# Patient Record
Sex: Male | Born: 1945
Health system: Southern US, Community
[De-identification: ages and names within clinical notes are randomized; demographics above are authoritative.]

## PROBLEM LIST (undated history)

## (undated) DIAGNOSIS — C61 Malignant neoplasm of prostate: Secondary | ICD-10-CM

## (undated) DIAGNOSIS — J449 Chronic obstructive pulmonary disease, unspecified: Secondary | ICD-10-CM

## (undated) DIAGNOSIS — R0600 Dyspnea, unspecified: Secondary | ICD-10-CM

## (undated) DIAGNOSIS — H269 Unspecified cataract: Secondary | ICD-10-CM

## (undated) DIAGNOSIS — M1990 Unspecified osteoarthritis, unspecified site: Secondary | ICD-10-CM

## (undated) DIAGNOSIS — H9319 Tinnitus, unspecified ear: Secondary | ICD-10-CM

## (undated) DIAGNOSIS — I10 Essential (primary) hypertension: Secondary | ICD-10-CM

## (undated) DIAGNOSIS — I82469 Acute embolism and thrombosis of unspecified calf muscular vein: Secondary | ICD-10-CM

## (undated) HISTORY — PX: PROSTATE BIOPSY: SHX241

## (undated) HISTORY — DX: Unspecified osteoarthritis, unspecified site: M19.90

## (undated) HISTORY — DX: Malignant neoplasm of prostate: C61

## (undated) HISTORY — PX: VENTRAL HERNIA REPAIR: SHX424

---

## 1998-10-07 ENCOUNTER — Ambulatory Visit (HOSPITAL_BASED_OUTPATIENT_CLINIC_OR_DEPARTMENT_OTHER): Admission: RE | Admit: 1998-10-07 | Discharge: 1998-10-07 | Payer: Self-pay | Admitting: *Deleted

## 1999-06-18 ENCOUNTER — Ambulatory Visit (HOSPITAL_COMMUNITY): Admission: RE | Admit: 1999-06-18 | Discharge: 1999-06-18 | Payer: Self-pay | Admitting: Gastroenterology

## 2003-04-22 ENCOUNTER — Ambulatory Visit (HOSPITAL_COMMUNITY): Admission: RE | Admit: 2003-04-22 | Discharge: 2003-04-22 | Payer: Self-pay | Admitting: Gastroenterology

## 2003-07-04 ENCOUNTER — Encounter: Admission: RE | Admit: 2003-07-04 | Discharge: 2003-07-04 | Payer: Self-pay | Admitting: Internal Medicine

## 2004-02-05 ENCOUNTER — Encounter: Admission: RE | Admit: 2004-02-05 | Discharge: 2004-02-05 | Payer: Self-pay | Admitting: Gastroenterology

## 2004-02-13 ENCOUNTER — Ambulatory Visit (HOSPITAL_COMMUNITY): Admission: RE | Admit: 2004-02-13 | Discharge: 2004-02-13 | Payer: Self-pay | Admitting: Gastroenterology

## 2004-08-13 ENCOUNTER — Encounter: Admission: RE | Admit: 2004-08-13 | Discharge: 2004-08-13 | Payer: Self-pay | Admitting: Gastroenterology

## 2004-08-31 ENCOUNTER — Encounter: Admission: RE | Admit: 2004-08-31 | Discharge: 2004-08-31 | Payer: Self-pay | Admitting: Gastroenterology

## 2005-02-08 ENCOUNTER — Encounter: Admission: RE | Admit: 2005-02-08 | Discharge: 2005-02-08 | Payer: Self-pay | Admitting: Gastroenterology

## 2006-05-26 ENCOUNTER — Encounter: Admission: RE | Admit: 2006-05-26 | Discharge: 2006-05-26 | Payer: Self-pay | Admitting: Gastroenterology

## 2012-05-22 DIAGNOSIS — M25551 Pain in right hip: Secondary | ICD-10-CM | POA: Insufficient documentation

## 2012-05-22 DIAGNOSIS — S76319A Strain of muscle, fascia and tendon of the posterior muscle group at thigh level, unspecified thigh, initial encounter: Secondary | ICD-10-CM | POA: Insufficient documentation

## 2012-07-03 DIAGNOSIS — M1710 Unilateral primary osteoarthritis, unspecified knee: Secondary | ICD-10-CM | POA: Insufficient documentation

## 2012-07-03 DIAGNOSIS — M179 Osteoarthritis of knee, unspecified: Secondary | ICD-10-CM | POA: Insufficient documentation

## 2012-09-08 ENCOUNTER — Other Ambulatory Visit: Payer: Self-pay | Admitting: Dermatology

## 2012-10-24 ENCOUNTER — Other Ambulatory Visit: Payer: Self-pay | Admitting: Dermatology

## 2013-06-20 ENCOUNTER — Other Ambulatory Visit: Payer: Self-pay | Admitting: Gastroenterology

## 2013-08-13 ENCOUNTER — Other Ambulatory Visit: Payer: Self-pay | Admitting: Gastroenterology

## 2013-08-13 DIAGNOSIS — K824 Cholesterolosis of gallbladder: Secondary | ICD-10-CM

## 2013-08-17 ENCOUNTER — Ambulatory Visit
Admission: RE | Admit: 2013-08-17 | Discharge: 2013-08-17 | Disposition: A | Discharge status: Home / Self Care | Payer: Medicare Other | Source: Ambulatory Visit | Attending: Gastroenterology | Admitting: Gastroenterology

## 2013-08-17 DIAGNOSIS — K824 Cholesterolosis of gallbladder: Secondary | ICD-10-CM

## 2014-01-04 ENCOUNTER — Encounter: Payer: Self-pay | Admitting: Radiation Oncology

## 2014-01-04 NOTE — Progress Notes (Signed)
GU Location of Tumor / Histology: adenocarcinoma of prostate  If Prostate Cancer, Gleason Score is (3 + 4) and PSA is (5.07)  Wayne Roberson presented August 2014 with an elevated PSA of 3.96  Biopsies of prostate (if applicable) revealed:    Past/Anticipated interventions by urology, if any: ultrasound, biopsy, discuss about treatment options, referral to [facility]  Past/Anticipated interventions by medical oncology, if any: no  Weight changes, if any: no  Bowel/Bladder complaints, if any: yes, nocturia x2  Nausea/Vomiting, if any: no  Pain issues, if any:  no  SAFETY ISSUES:  Prior radiation? no  Pacemaker/ICD? no  Possible current pregnancy? no  Is the patient on methotrexate? no  Current Complaints / other details:  68 year old male. Referred by Dr. Janice Norrie. Not interested in prostatectomy and active surveillance is an option. Would like radiation therapy. Prostate volume 27.86 cc.

## 2014-01-06 ENCOUNTER — Encounter: Payer: Self-pay | Admitting: Radiation Oncology

## 2014-01-06 DIAGNOSIS — C61 Malignant neoplasm of prostate: Secondary | ICD-10-CM | POA: Insufficient documentation

## 2014-01-06 NOTE — Progress Notes (Signed)
Radiation Oncology         [phone]) [phone] ________________________________     Initial outpatient Consultation  Name: Wayne Roberson MRN: [id number]  Date: 01/07/2014  DOB: December 28, 1945  CC:No primary provider on file.  [name], MD   REFERRING PHYSICIAN: [name], MD  DIAGNOSIS: 68 y.o. gentleman with stage T1c adenocarcinoma of the prostate with a Gleason's score of 3+4 and a PSA of 5.07  HISTORY OF PRESENT ILLNESS::Wayne Roberson is a 68 y.o. gentleman followed by Dr. Janice Norrie after referral from Dr. Earle Gell noted rising PSA.  It seemed to stabilize.  More recently, he was noted to have a rising PSA.  It was 3.96 on 11/06/12, then 4.21 on 05/09/13, and 5.07 on 10/31/13.  He saw Dr. Janice Norrie in the office on 11/07/13,  digital rectal examination was performed at that time revealing a 2+ gland with no nodule.  The patient proceeded to transrectal ultrasound with 12 biopsies of the prostate on 12/20/13.  The prostate volume measured 27.86 cc.  Out of 12 core biopsies, 7 were positive.  The maximum Gleason score was 3+4, and this was seen in the left base and mid gland as shown below:    The patient reviewed the biopsy results with his urologist and he has kindly been referred today for discussion of potential radiation treatment options.  PREVIOUS RADIATION THERAPY: No  PAST MEDICAL HISTORY:  has a past medical history of Arthritis and Prostate cancer.    PAST SURGICAL HISTORY: Past Surgical History  Procedure Laterality Date  . Ventral hernia repair    . Prostate biopsy      FAMILY HISTORY: family history includes Heart failure in his father and mother. There is no history of Cancer.  SOCIAL HISTORY:  reports that he quit smoking about 9 years ago. His smoking use included Cigarettes. He has a 84 pack-year smoking history. He has never used smokeless tobacco. He reports that he drinks about one ounce of alcohol per week. He reports that he does not use illicit  drugs.  ALLERGIES: Penicillins  MEDICATIONS:  Current Outpatient Prescriptions  Medication Sig Dispense Refill  . fluocinonide (LIDEX) 0.05 % external solution Apply 1 application topically 2 (two) times daily.      [name] LORazepam (ATIVAN) 0.5 MG tablet Take 0.5 mg by mouth every 8 (eight) hours.       No current facility-administered medications for this encounter.    REVIEW OF SYSTEMS:  A 15 point review of systems is documented in the electronic medical record. This was obtained by the nursing staff. However, I reviewed this with the patient to discuss relevant findings and make appropriate changes.  A comprehensive review of systems was negative..  The patient completed an IPSS and IIEF questionnaire.  His IPSS score was 4 indicating mild urinary outflow obstructive symptoms.  He indicated that his erectile function is able to complete sexual activity on almost all attempts.   PHYSICAL EXAM: This patient is in no acute distress.  He is alert and oriented.   height is 5\' 10"  (1.778 m) and weight is 191 lb 11.2 oz (86.955 kg). His blood pressure is 158/82 and his pulse is 67. His respiration is 16.  He exhibits no respiratory distress or labored breathing.  He appears neurologically intact.  His mood is pleasant.  His affect is appropriate.  Please note the digital rectal exam findings described above.  KPS = 100  LABORATORY DATA: see HPI  RADIOGRAPHY: No results  found.    IMPRESSION: This gentleman is a 68 y.o. gentleman with stage T1c adenocarcinoma of the prostate with a Gleason's score of 3+4 and a PSA of 5.07.  His T-Stage, Gleason's Score, and PSA put him into the intermediate risk group.  Accordingly he is eligible for a variety of potential treatment options including robotic-assisted laparoscopic radical prostatectomy, external beam radiation treatment, external beam radiation treatment with seed implant boost, or seed implantation as monotherapy. He is not eligible for active  surveillance.  PLAN:Today I reviewed the findings and workup thus far.  We discussed the natural history of prostate cancer.  We reviewed the the implications of T-stage, Gleason's Score, and PSA on decision-making and outcomes in prostate cancer.  We discussed radiation treatment in the management of prostate cancer with regard to the logistics and delivery of external beam radiation treatment as well as the logistics and delivery of prostate brachytherapy.  We compared and contrasted each of these approaches and also compared these against prostatectomy.  The patient expressed interest in prostate brachytherapy.  I filled out a patient counseling form for him with relevant treatment diagrams and we retained a copy for our records.   The patient would like to proceed with prostate brachytherapy.  I will share my findings with Dr. Janice Norrie and move forward with scheduling the procedure in the near future.     I enjoyed meeting with him today, and will look forward to participating in the care of this very nice gentleman.   I spent 60 minutes face to face with the patient and more than 50% of that time was spent in counseling and/or coordination of care.   ------------------------------------------------  Sheral Apley. Tammi Klippel, M.D.

## 2014-01-07 ENCOUNTER — Ambulatory Visit
Admission: RE | Admit: 2014-01-07 | Discharge: 2014-01-07 | Disposition: A | Discharge status: Home / Self Care | Payer: Medicare Other | Source: Ambulatory Visit | Attending: Radiation Oncology | Admitting: Radiation Oncology

## 2014-01-07 ENCOUNTER — Encounter: Payer: Self-pay | Admitting: Radiation Oncology

## 2014-01-07 VITALS — BP 158/82 | HR 67 | Resp 16 | Ht 70.0 in | Wt 191.7 lb

## 2014-01-07 DIAGNOSIS — Z87891 Personal history of nicotine dependence: Secondary | ICD-10-CM | POA: Insufficient documentation

## 2014-01-07 DIAGNOSIS — C61 Malignant neoplasm of prostate: Secondary | ICD-10-CM | POA: Diagnosis not present

## 2014-01-07 DIAGNOSIS — Z510 Encounter for antineoplastic radiation therapy: Secondary | ICD-10-CM | POA: Insufficient documentation

## 2014-01-07 NOTE — Progress Notes (Signed)
See progress note under physician encounter. 

## 2014-01-08 ENCOUNTER — Telehealth: Payer: Self-pay | Admitting: *Deleted

## 2014-01-08 NOTE — Telephone Encounter (Signed)
CALLED PATIENT TO INFORM OF PRE-SEED APPT. AND IMPLANT, LVM FOR A RETURN CALL

## 2014-01-09 ENCOUNTER — Other Ambulatory Visit: Payer: Self-pay | Admitting: Urology

## 2014-01-17 ENCOUNTER — Telehealth: Payer: Self-pay | Admitting: *Deleted

## 2014-01-17 NOTE — Telephone Encounter (Signed)
Called patient to remind of pre-seed appt. For 01-18-14, lvm for a return call

## 2014-01-18 ENCOUNTER — Ambulatory Visit
Admission: RE | Admit: 2014-01-18 | Discharge: 2014-01-18 | Disposition: A | Discharge status: Home / Self Care | Payer: Medicare Other | Source: Ambulatory Visit | Attending: Radiation Oncology | Admitting: Radiation Oncology

## 2014-01-18 ENCOUNTER — Ambulatory Visit (HOSPITAL_COMMUNITY)
Admission: RE | Admit: 2014-01-18 | Discharge: 2014-01-18 | Disposition: A | Discharge status: Home / Self Care | Payer: Medicare Other | Source: Ambulatory Visit | Attending: Urology | Admitting: Urology

## 2014-01-18 ENCOUNTER — Encounter: Payer: Self-pay | Admitting: Radiation Oncology

## 2014-01-18 ENCOUNTER — Other Ambulatory Visit: Payer: Self-pay

## 2014-01-18 DIAGNOSIS — Z87891 Personal history of nicotine dependence: Secondary | ICD-10-CM | POA: Diagnosis not present

## 2014-01-18 DIAGNOSIS — Z01810 Encounter for preprocedural cardiovascular examination: Secondary | ICD-10-CM | POA: Diagnosis not present

## 2014-01-18 DIAGNOSIS — Z01818 Encounter for other preprocedural examination: Secondary | ICD-10-CM

## 2014-01-18 DIAGNOSIS — C61 Malignant neoplasm of prostate: Secondary | ICD-10-CM

## 2014-01-18 DIAGNOSIS — Z510 Encounter for antineoplastic radiation therapy: Secondary | ICD-10-CM | POA: Diagnosis not present

## 2014-01-18 NOTE — Progress Notes (Signed)
  Radiation Oncology         [phone]) [phone] ________________________________     Name: Wayne Roberson MRN: [id number]  Date: 01/18/2014  DOB: 10-27-1945  SIMULATION AND TREATMENT PLANNING NOTE PUBIC ARCH STUDY  REFERRING PHYSICIAN: [name], MD  DIAGNOSIS: 68 y.o. gentleman with stage T1c adenocarcinoma of the prostate with a Gleason's score of 3+4 and a PSA of 5.07    ICD-9-CM ICD-10-CM  1. Prostate cancer 185 C61    COMPLEX SIMULATION:  The patient presented today for evaluation for possible prostate seed implant. He was brought to the radiation planning suite and placed supine on the CT couch. A 3-dimensional image study set was obtained in upload to the planning computer. There, on each axial slice, I contoured the prostate gland. Then, using three-dimensional radiation planning tools I reconstructed the prostate in view of the structures from the transperineal needle pathway to assess for possible pubic arch interference. In doing so, I did not appreciate any pubic arch interference. Also, the patient's prostate volume was estimated based on the drawn structure. The volume was 33 cc.  Given the pubic arch appearance and prostate volume, patient remains a good candidate to proceed with prostate seed implant. Today, he freely provided informed written consent to proceed.    PLAN: The patient will undergo prostate seed implant.   ________________________________  Sheral Apley. Tammi Klippel, M.D.

## 2014-02-25 ENCOUNTER — Encounter (HOSPITAL_BASED_OUTPATIENT_CLINIC_OR_DEPARTMENT_OTHER): Payer: Self-pay | Admitting: *Deleted

## 2014-02-25 DIAGNOSIS — N400 Enlarged prostate without lower urinary tract symptoms: Secondary | ICD-10-CM | POA: Diagnosis not present

## 2014-02-25 DIAGNOSIS — Z87891 Personal history of nicotine dependence: Secondary | ICD-10-CM | POA: Diagnosis not present

## 2014-02-25 DIAGNOSIS — M13869 Other specified arthritis, unspecified knee: Secondary | ICD-10-CM | POA: Diagnosis not present

## 2014-02-25 DIAGNOSIS — C61 Malignant neoplasm of prostate: Secondary | ICD-10-CM | POA: Diagnosis present

## 2014-02-25 LAB — PROTIME-INR
INR: 0.94 (ref 0.00–1.49)
Prothrombin Time: 12.7 seconds (ref 11.6–15.2)

## 2014-02-25 LAB — CBC
HCT: 43.2 % (ref 39.0–52.0)
Hemoglobin: 14.3 g/dL (ref 13.0–17.0)
MCH: 29.1 pg (ref 26.0–34.0)
MCHC: 33.1 g/dL (ref 30.0–36.0)
MCV: 88 fL (ref 78.0–100.0)
Platelets: 185 10*3/uL (ref 150–400)
RBC: 4.91 MIL/uL (ref 4.22–5.81)
RDW: 13.9 % (ref 11.5–15.5)
WBC: 6.3 10*3/uL (ref 4.0–10.5)

## 2014-02-25 LAB — COMPREHENSIVE METABOLIC PANEL
ALT: 21 U/L (ref 0–53)
AST: 21 U/L (ref 0–37)
Albumin: 3.6 g/dL (ref 3.5–5.2)
Alkaline Phosphatase: 69 U/L (ref 39–117)
Anion gap: 12 (ref 5–15)
BUN: 16 mg/dL (ref 6–23)
CO2: 25 mEq/L (ref 19–32)
Calcium: 9.1 mg/dL (ref 8.4–10.5)
Chloride: 103 mEq/L (ref 96–112)
Creatinine, Ser: 0.77 mg/dL (ref 0.50–1.35)
GFR calc Af Amer: >90 mL/min (ref >90)
GFR calc non Af Amer: 90 mL/min — ABNORMAL LOW (ref >90)
Glucose, Bld: 135 mg/dL — ABNORMAL HIGH (ref 70–99)
Potassium: 4.1 mEq/L (ref 3.7–5.3)
Sodium: 140 mEq/L (ref 137–147)
Total Bilirubin: 0.8 mg/dL (ref 0.3–1.2)
Total Protein: 6.7 g/dL (ref 6.0–8.3)

## 2014-02-25 LAB — APTT: aPTT: 26 seconds (ref 24–37)

## 2014-02-25 NOTE — Progress Notes (Signed)
Pt instructed npo p mn 12/3.to [facility] 12/4 @ 0800.  Pt to do fleets enema prior to arrival.  Labs, ekg, cxr in chart.

## 2014-02-27 DIAGNOSIS — Z510 Encounter for antineoplastic radiation therapy: Secondary | ICD-10-CM | POA: Diagnosis not present

## 2014-02-28 ENCOUNTER — Telehealth: Payer: Self-pay | Admitting: *Deleted

## 2014-02-28 NOTE — Telephone Encounter (Signed)
CALLED PATIENT TO REMIND OF PROCEDURE FOR 03-01-14, SPOKE WITH PATIENT AND HE IS AWARE OF THIS PROCEDURE

## 2014-03-01 ENCOUNTER — Ambulatory Visit (HOSPITAL_BASED_OUTPATIENT_CLINIC_OR_DEPARTMENT_OTHER): Payer: Medicare Other | Admitting: Anesthesiology

## 2014-03-01 ENCOUNTER — Encounter (HOSPITAL_BASED_OUTPATIENT_CLINIC_OR_DEPARTMENT_OTHER)
Admission: RE | Disposition: A | Discharge status: Home / Self Care | Payer: Self-pay | Source: Ambulatory Visit | Attending: Urology

## 2014-03-01 ENCOUNTER — Ambulatory Visit (HOSPITAL_COMMUNITY): Payer: Medicare Other

## 2014-03-01 ENCOUNTER — Encounter (HOSPITAL_BASED_OUTPATIENT_CLINIC_OR_DEPARTMENT_OTHER): Payer: Self-pay | Admitting: Anesthesiology

## 2014-03-01 ENCOUNTER — Ambulatory Visit (HOSPITAL_BASED_OUTPATIENT_CLINIC_OR_DEPARTMENT_OTHER)
Admission: RE | Admit: 2014-03-01 | Discharge: 2014-03-01 | Disposition: A | Discharge status: Home / Self Care | Payer: Medicare Other | Source: Ambulatory Visit | Attending: Urology | Admitting: Urology

## 2014-03-01 DIAGNOSIS — Z87891 Personal history of nicotine dependence: Secondary | ICD-10-CM | POA: Diagnosis not present

## 2014-03-01 DIAGNOSIS — M13869 Other specified arthritis, unspecified knee: Secondary | ICD-10-CM | POA: Insufficient documentation

## 2014-03-01 DIAGNOSIS — Z01818 Encounter for other preprocedural examination: Secondary | ICD-10-CM

## 2014-03-01 DIAGNOSIS — N400 Enlarged prostate without lower urinary tract symptoms: Secondary | ICD-10-CM | POA: Insufficient documentation

## 2014-03-01 DIAGNOSIS — C61 Malignant neoplasm of prostate: Secondary | ICD-10-CM | POA: Diagnosis not present

## 2014-03-01 HISTORY — DX: Unspecified cataract: H26.9

## 2014-03-01 HISTORY — PX: RADIOACTIVE SEED IMPLANT: SHX5150

## 2014-03-01 SURGERY — INSERTION, RADIATION SOURCE, PROSTATE
Anesthesia: General | Site: Prostate

## 2014-03-01 MED ORDER — HYDROCODONE-ACETAMINOPHEN 5-325 MG PO TABS: 1.0000 | ORAL_TABLET | Freq: Four times a day (QID) | ORAL | Status: DC | PRN

## 2014-03-01 MED ORDER — EPHEDRINE SULFATE 50 MG/ML IJ SOLN
INTRAMUSCULAR | Status: DC | PRN
  Administered 2014-03-01: 10 mg via INTRAVENOUS

## 2014-03-01 MED ORDER — CIPROFLOXACIN IN D5W 400 MG/200ML IV SOLN
INTRAVENOUS | Status: AC
  Filled 2014-03-01: qty 200

## 2014-03-01 MED ORDER — PROPOFOL 10 MG/ML IV BOLUS
INTRAVENOUS | Status: DC | PRN
  Administered 2014-03-01: 200 mg via INTRAVENOUS

## 2014-03-01 MED ORDER — IOHEXOL 350 MG/ML SOLN
INTRAVENOUS | Status: DC | PRN
  Administered 2014-03-01: 7 mL

## 2014-03-01 MED ORDER — FENTANYL CITRATE 0.05 MG/ML IJ SOLN
25.0000 ug | INTRAMUSCULAR | Status: DC | PRN
  Filled 2014-03-01: qty 1

## 2014-03-01 MED ORDER — KETOROLAC TROMETHAMINE 30 MG/ML IJ SOLN
INTRAMUSCULAR | Status: DC | PRN
  Administered 2014-03-01: 15 mg via INTRAVENOUS

## 2014-03-01 MED ORDER — ACETAMINOPHEN 10 MG/ML IV SOLN
INTRAVENOUS | Status: DC | PRN
  Administered 2014-03-01: 1000 mg via INTRAVENOUS

## 2014-03-01 MED ORDER — LACTATED RINGERS IV SOLN
INTRAVENOUS | Status: DC
Start: 2014-03-01 — End: 2014-03-01
  Administered 2014-03-01 (×2): via INTRAVENOUS
  Filled 2014-03-01: qty 1000

## 2014-03-01 MED ORDER — CIPROFLOXACIN IN D5W 400 MG/200ML IV SOLN
400.0000 mg | INTRAVENOUS | Status: AC
  Administered 2014-03-01: 400 mg via INTRAVENOUS
  Filled 2014-03-01: qty 200

## 2014-03-01 MED ORDER — FENTANYL CITRATE 0.05 MG/ML IJ SOLN
INTRAMUSCULAR | Status: AC
  Filled 2014-03-01: qty 4

## 2014-03-01 MED ORDER — PROMETHAZINE HCL 25 MG/ML IJ SOLN
6.2500 mg | INTRAMUSCULAR | Status: DC | PRN
Start: 2014-03-01 — End: 2014-03-01
  Filled 2014-03-01: qty 1

## 2014-03-01 MED ORDER — CIPROFLOXACIN HCL 500 MG PO TABS: 500.0000 mg | ORAL_TABLET | Freq: Two times a day (BID) | ORAL | Status: DC

## 2014-03-01 MED ORDER — FENTANYL CITRATE 0.05 MG/ML IJ SOLN
INTRAMUSCULAR | Status: DC | PRN
  Administered 2014-03-01: 100 ug via INTRAVENOUS
  Administered 2014-03-01: 50 ug via INTRAVENOUS
  Administered 2014-03-01 (×2): 25 ug via INTRAVENOUS

## 2014-03-01 MED ORDER — DEXAMETHASONE SODIUM PHOSPHATE 4 MG/ML IJ SOLN
INTRAMUSCULAR | Status: DC | PRN
  Administered 2014-03-01: 10 mg via INTRAVENOUS

## 2014-03-01 MED ORDER — LIDOCAINE HCL (CARDIAC) 20 MG/ML IV SOLN
INTRAVENOUS | Status: DC | PRN
  Administered 2014-03-01: 100 mg via INTRAVENOUS

## 2014-03-01 MED ORDER — STERILE WATER FOR IRRIGATION IR SOLN
Status: DC | PRN
  Administered 2014-03-01: 500 mL

## 2014-03-01 MED ORDER — FLEET ENEMA 7-19 GM/118ML RE ENEM
1.0000 | ENEMA | Freq: Once | RECTAL | Status: DC
  Filled 2014-03-01: qty 1

## 2014-03-01 MED ORDER — ONDANSETRON HCL 4 MG/2ML IJ SOLN
INTRAMUSCULAR | Status: DC | PRN
  Administered 2014-03-01: 4 mg via INTRAVENOUS

## 2014-03-01 SURGICAL SUPPLY — 27 items
BAG URINE DRAINAGE (UROLOGICAL SUPPLIES) ×3 IMPLANT
BLADE CLIPPER SURG (BLADE) ×3 IMPLANT
CATH FOLEY 2WAY SLVR  5CC 16FR (CATHETERS) ×4
CATH FOLEY 2WAY SLVR 5CC 16FR (CATHETERS) ×2 IMPLANT
CATH ROBINSON RED A/P 20FR (CATHETERS) ×3 IMPLANT
CLOTH BEACON ORANGE TIMEOUT ST (SAFETY) ×3 IMPLANT
COVER MAYO STAND STRL (DRAPES) ×3 IMPLANT
COVER TABLE BACK 60X90 (DRAPES) ×3 IMPLANT
DRSG TEGADERM 4X4.75 (GAUZE/BANDAGES/DRESSINGS) ×3 IMPLANT
DRSG TEGADERM 8X12 (GAUZE/BANDAGES/DRESSINGS) ×3 IMPLANT
GLOVE BIO SURGEON STRL SZ 6.5 (GLOVE) ×2 IMPLANT
GLOVE BIO SURGEON STRL SZ7 (GLOVE) ×6 IMPLANT
GLOVE BIO SURGEON STRL SZ7.5 (GLOVE) IMPLANT
GLOVE BIO SURGEONS STRL SZ 6.5 (GLOVE) ×1
GLOVE BIOGEL PI IND STRL 6.5 (GLOVE) ×2 IMPLANT
GLOVE BIOGEL PI INDICATOR 6.5 (GLOVE) ×4
GLOVE ECLIPSE 8.0 STRL XLNG CF (GLOVE) ×15 IMPLANT
GOWN STRL REUS W/ TWL LRG LVL3 (GOWN DISPOSABLE) ×2 IMPLANT
GOWN STRL REUS W/TWL LRG LVL3 (GOWN DISPOSABLE) ×4
HOLDER FOLEY CATH W/STRAP (MISCELLANEOUS) ×3 IMPLANT
IV WATER IRRIG STERILE 3000ML (IV SOLUTION) ×3 IMPLANT
PACK CYSTO (CUSTOM PROCEDURE TRAY) ×3 IMPLANT
SPONGE GAUZE 4X4 12PLY STER LF (GAUZE/BANDAGES/DRESSINGS) ×3 IMPLANT
SYRINGE 10CC LL (SYRINGE) ×3 IMPLANT
UNDERPAD 30X30 INCONTINENT (UNDERPADS AND DIAPERS) ×6 IMPLANT
WATER STERILE IRR 500ML POUR (IV SOLUTION) ×3 IMPLANT
selectSeed I-125 ×207 IMPLANT

## 2014-03-01 NOTE — Transfer of Care (Signed)
Immediate Anesthesia Transfer of Care Note  Patient: Wayne Roberson  Procedure(s) Performed: Procedure(s): RADIOACTIVE SEED IMPLANT (N/A)  Patient Location: PACU  Anesthesia Type:General  Level of Consciousness: awake, alert  and oriented  Airway & Oxygen Therapy: Patient Spontanous Breathing and Patient connected to nasal cannula oxygen  Post-op Assessment: Report given to PACU RN  Post vital signs: Reviewed and stable  Complications: No apparent anesthesia complications

## 2014-03-01 NOTE — Op Note (Signed)
Wayne Roberson is a 68 y.o.   03/01/2014   Preop diagnosis: Adenocarcinoma of prostate stage TIc, Gleason 3+4  Postop diagnosis: Same  Procedure done: I-125 seeds implantation, cystoscopy  Surgeon: Charlene Brooke. [name] and Dr. Tyler Pita  Anesthesia: Gen.  Indication: patient is a 68 years old male who was diagnosed with adenocarcinoma of the prostate Gleason 3+4. PSA is 5.07. Treatment options were discussed with him. He saw Dr. Tammi Klippel in consultation. He elected to have a seeds implantation. He is scheduled today for the procedure.  Procedure:patient was identified by his wrist band and proper timeout was taken.  Under general anesthesia he was prepped and draped and placed in the dorsolithotomy position. Ultrasound planning was then done by Dr. Tammi Klippel. When the planning was completed on ultrasound guidance and with the Nucletron a total of 69 seeds were inserted in the prostate through 29 needles. Under fluoroscopy there appears to be good seeds distribution.  The Foley catheter that was previously inserted in the bladder was then removed. A flexible cystoscope was passed under direct vision in the bladder. The anterior urethra is normal. There is trilobar prostatic hypertrophy. The bladder mucosa is normal. There is no stone or tumor in the bladder. There are no seeds in the bladder either. The ureteral orifices are in normal position and shape. The cystoscope was then removed. A #16 French Foley catheter was reinserted in the bladder.  The patient tolerated the procedure well and left the OR in satisfactory condition to postanesthesia care unit.

## 2014-03-01 NOTE — Anesthesia Procedure Notes (Signed)
Procedure Name: LMA Insertion Date/Time: 03/01/2014 10:21 AM Performed by: Bethena Roys T Pre-anesthesia Checklist: Patient identified, Emergency Drugs available, Suction available and Patient being monitored Patient Re-evaluated:Patient Re-evaluated prior to inductionOxygen Delivery Method: Circle System Utilized Preoxygenation: Pre-oxygenation with 100% oxygen Intubation Type: IV induction Ventilation: Mask ventilation without difficulty LMA: LMA inserted LMA Size: 5.0 Number of attempts: 1 Airway Equipment and Method: bite block Placement Confirmation: positive ETCO2 Dental Injury: Teeth and Oropharynx as per pre-operative assessment

## 2014-03-01 NOTE — Discharge Instructions (Signed)
Radioactive Seed Implant Home Care Instructions   Activity:    Rest for the remainder of the day.  Do not drive or operate equipment today.  You may resume normal  activities in a few days as instructed by your physician, without risk of harmful radiation exposure to those around you, provided you follow the time and distance precautions on the Radiation Oncology Instruction Sheet.   Meals: Drink plenty of lipuids and eat light foods, such as gelatin or soup this evening .  You may return to normal meal plan tomorrow.  Return To Work: You may return to work as instructed by your physician.  Special Instruction:   If any seeds are found, use tweezers to pick up seeds and place in a glass container of any kind and bring to your physician's office.  Call your physician if any of these symptoms occur:  Persistent or heavy bleeding Urine stream diminishes or stops completely after catheter is removed Fever equal to or greater than 101 degrees F Cloudy urine with a strong foul odor Severe pain  You may feel some burning pain and/or hesitancy when you urinate after the catheter is removed.  These symptoms may increase over the next few weeks, but should diminish within forur to six weeks.  Applying moist heat to the lower abdomen or a hot tub bath may help relieve the pain.  If the discomfort becomes severe, please call your physician for additional medications.   Post Anesthesia Home Care Instructions  Activity: Get plenty of rest for the remainder of the day. A responsible adult should stay with you for 24 hours following the procedure.  For the next 24 hours, DO NOT: -Drive a car -Operate machinery -Drink alcoholic beverages -Take any medication unless instructed by your physician -Make any legal decisions or sign important papers.  Meals: Start with liquid foods such as gelatin or soup. Progress to regular foods as tolerated. Avoid greasy, spicy, heavy foods. If nausea and/or  vomiting occur, drink only clear liquids until the nausea and/or vomiting subsides. Call your physician if vomiting continues.  Special Instructions/Symptoms: Your throat may feel dry or sore from the anesthesia or the breathing tube placed in your throat during surgery. If this causes discomfort, gargle with warm salt water. The discomfort should disappear within 24 hours.  

## 2014-03-01 NOTE — Anesthesia Postprocedure Evaluation (Signed)
  Anesthesia Post-op Note  Patient: Wayne Roberson  Procedure(s) Performed: Procedure(s) (LRB): RADIOACTIVE SEED IMPLANT (N/A)  Patient Location: PACU  Anesthesia Type: general  Level of Consciousness: awake and alert   Airway and Oxygen Therapy: Patient Spontanous Breathing  Post-op Pain: mild  Post-op Assessment: Post-op Vital signs reviewed, Patient's Cardiovascular Status Stable, Respiratory Function Stable, Patent Airway and No signs of Nausea or vomiting  Last Vitals:  Filed Vitals:   03/01/14 1300  BP: 131/70  Pulse: 71  Temp: 36.6 C  Resp: 18    Post-op Vital Signs: stable   Complications: No apparent anesthesia complications

## 2014-03-01 NOTE — Procedures (Signed)
  Radiation Oncology         [phone] ________________________________  Name: KENDAN CORNFORTH MRN: [id number]  Date: 03/01/2014  DOB: 1945-08-30       Prostate Seed Implant  RJ:JOACZYS,AYTKZS K, MD  No ref. provider found  DIAGNOSIS: 68 y.o. gentleman with stage T1c adenocarcinoma of the prostate with a Gleason's score of 3+4 and a PSA of 5.07  PROCEDURE: Insertion of radioactive I-125 seeds into the prostate gland.  RADIATION DOSE: 145 Gy, definitive therapy.  TECHNIQUE: OMERE MARTI was brought to the operating room with the urologist. He was placed in the dorsolithotomy position. He was catheterized and a rectal tube was inserted. The perineum was shaved, prepped and draped. The ultrasound probe was then introduced into the rectum to see the prostate gland.  TREATMENT DEVICE: A needle grid was attached to the ultrasound probe stand and anchor needles were placed.  3D PLANNING: The prostate was imaged in 3D using a sagittal sweep of the prostate probe. These images were transferred to the planning computer. There, the prostate, urethra and rectum were defined on each axial reconstructed image. Then, the software created an optimized 3D plan and a few seed positions were adjusted. The quality of the plan was reviewed using [facility] information for the target and the following two organs at risk:  Urethra and Rectum.  Then the accepted plan was uploaded to the seed Selectron afterloading unit.  PROSTATE VOLUME STUDY:  Using transrectal ultrasound the volume of the prostate was verified.  SPECIAL TREATMENT PROCEDURE/SUPERVISION AND HANDLING: The Nucletron FIRST system was used to place the needles under sagittal guidance. A total of 25 needles were used to deposit 69 seeds in the prostate gland. The individual seed activity was 0.423 mCi.  COMPLEX SIMULATION: At the end of the procedure, an anterior radiograph of the pelvis was obtained to document seed positioning and count. Cystoscopy was  performed to check the urethra and bladder.  MICRODOSIMETRY: At the end of the procedure, the patient was emitting 0.06 mrem/hr at 1 meter. Accordingly, he was considered safe for hospital discharge.  PLAN: The patient will return to the radiation oncology clinic for post implant CT dosimetry in three weeks.   ________________________________  Sheral Apley Tammi Klippel, M.D.

## 2014-03-01 NOTE — Anesthesia Preprocedure Evaluation (Addendum)
Anesthesia Evaluation  Patient identified by MRN, date of birth, ID band Patient awake    Reviewed: Allergy & Precautions, NPO status , Patient's Chart, lab work & pertinent test results  Airway Mallampati: II  TM Distance: >3 FB Neck ROM: Full    Dental no notable dental hx.    Pulmonary former smoker,  breath sounds clear to auscultation  Pulmonary exam normal       Cardiovascular Exercise Tolerance: Good negative cardio ROS  Rhythm:Regular Rate:Normal     Neuro/Psych negative neurological ROS  negative psych ROS   GI/Hepatic negative GI ROS, Neg liver ROS,   Endo/Other  negative endocrine ROS  Renal/GU negative Renal ROS  negative genitourinary   Musculoskeletal  (+) Arthritis -,   Abdominal   Peds negative pediatric ROS (+)  Hematology negative hematology ROS (+)   Anesthesia Other Findings   Reproductive/Obstetrics negative OB ROS                            Anesthesia Physical Anesthesia Plan  ASA: II  Anesthesia Plan: General   Post-op Pain Management:    Induction: Intravenous  Airway Management Planned: LMA  Additional Equipment:   Intra-op Plan:   Post-operative Plan: Extubation in OR  Informed Consent: I have reviewed the patients History and Physical, chart, labs and discussed the procedure including the risks, benefits and alternatives for the proposed anesthesia with the patient or authorized representative who has indicated his/her understanding and acceptance.   Dental advisory given  Plan Discussed with: CRNA  Anesthesia Plan Comments:         Anesthesia Quick Evaluation

## 2014-03-01 NOTE — H&P (Signed)
Urology Admission H&P  Chief Complaint: Adenocarcinoma of prostate  History of Present Illness: Mr Wayne Roberson has Gleason 3+4 prostate cancer.  His PSA is 5.07.  Treatment options were discussed with him.  He saw Dr Tammi Klippel in consultation and elected to have brachytherapy.  He is scheduled today for the procedure.  Past Medical History  Diagnosis Date  . Prostate cancer   . Arthritis     knees  . Trace cataracts    Past Surgical History  Procedure Laterality Date  . Ventral hernia repair    . Prostate biopsy      Home Medications:  Prescriptions prior to admission  Medication Sig Dispense Refill Last Dose  . calcium carbonate (OS-CAL) 600 MG TABS tablet Take 600 mg by mouth daily.   Past Month at Unknown time  . fluocinonide (LIDEX) 0.05 % external solution Apply 1 application topically 2 (two) times daily.   Past Week at Unknown time  . Glucosamine-Chondroitin-MSM (TRIPLE FLEX PO) Take by mouth.   Past Month at Unknown time  . Multiple Vitamins-Minerals (MULTIVITAMIN WITH MINERALS) tablet Take 1 tablet by mouth daily.   Past Month at Unknown time  . Omega-3 Fatty Acids (FISH OIL PO) Take by mouth.   Past Month at Unknown time  . vitamin C (ASCORBIC ACID) 500 MG tablet Take 500 mg by mouth daily.   Past Month at Unknown time  . LORazepam (ATIVAN) 0.5 MG tablet Take 0.5 mg by mouth every 8 (eight) hours.   More than a month at Unknown time   Allergies:  Allergies  Allergen Reactions  . Penicillins     Childhood allergy.    Family History  Problem Relation Age of Onset  . Heart failure Mother   . Heart failure Father   . Cancer Neg Hx    Social History:  reports that he quit smoking about 9 years ago. His smoking use included Cigarettes. He has a 84 pack-year smoking history. He has never used smokeless tobacco. He reports that he drinks about 7.2 oz of alcohol per week. He reports that he does not use illicit drugs.  Review of Systems  Constitutional: Negative.    Genitourinary: Positive for frequency.    Physical Exam:  Vital signs in last 24 hours: Temp:  [98.9 F (37.2 C)] 98.9 F (37.2 C) (12/04 0817) Pulse Rate:  [73] 73 (12/04 0817) Resp:  [18] 18 (12/04 0817) BP: (152)/(81) 152/81 mmHg (12/04 0817) SpO2:  [97 %] 97 % (12/04 0817) Weight:  [84.823 kg (187 lb)] 84.823 kg (187 lb) (12/04 0817) Physical Exam  Constitutional: He appears well-developed and well-nourished.  HENT:  Head: Normocephalic.  Eyes: Pupils are equal, round, and reactive to light.  Neck: Normal range of motion. Neck supple. No thyromegaly present.  Cardiovascular: Normal rate and regular rhythm.   Respiratory: Breath sounds normal. No respiratory distress.  GI: Soft. Bowel sounds are normal. He exhibits no distension. There is no tenderness. There is no rebound and no guarding.  Genitourinary: Penis normal.  Musculoskeletal: Normal range of motion.  Lymphadenopathy:    He has no cervical adenopathy.  Neurological: He is alert.  Skin: Skin is warm.  Psychiatric: He has a normal mood and affect.    Laboratory Data:  No results found for this or any previous visit (from the past 24 hour(s)). No results found for this or any previous visit (from the past 240 hour(s)). Creatinine:  Recent Labs  02/25/14 0829  CREATININE 0.77  Impression/Assessment:  Adenocarcinoma of prostate  Plan:  I 125 seeds implantation.  [name] 03/01/2014, 10:18 AM

## 2014-03-04 ENCOUNTER — Encounter (HOSPITAL_BASED_OUTPATIENT_CLINIC_OR_DEPARTMENT_OTHER): Payer: Self-pay | Admitting: Urology

## 2014-03-27 ENCOUNTER — Telehealth: Payer: Self-pay | Admitting: *Deleted

## 2014-03-27 NOTE — Telephone Encounter (Signed)
CALLED PATIENT TO REMIND OF APPTS. FOR 03-28-14, SPOKE WITH PATIENT AND HE IS AWARE OF THESE APPTS.

## 2014-03-28 ENCOUNTER — Ambulatory Visit
Admission: RE | Admit: 2014-03-28 | Discharge: 2014-03-28 | Disposition: A | Discharge status: Home / Self Care | Payer: Medicare Other | Source: Ambulatory Visit | Attending: Radiation Oncology | Admitting: Radiation Oncology

## 2014-03-28 ENCOUNTER — Ambulatory Visit
Admit: 2014-03-28 | Discharge: 2014-03-28 | Disposition: A | Discharge status: Home / Self Care | Payer: Medicare Other | Attending: Radiation Oncology | Admitting: Radiation Oncology

## 2014-03-28 ENCOUNTER — Encounter: Payer: Self-pay | Admitting: Radiation Oncology

## 2014-03-28 VITALS — BP 144/84 | HR 71 | Resp 16 | Wt 194.5 lb

## 2014-03-28 DIAGNOSIS — C61 Malignant neoplasm of prostate: Secondary | ICD-10-CM

## 2014-03-28 DIAGNOSIS — Z510 Encounter for antineoplastic radiation therapy: Secondary | ICD-10-CM | POA: Diagnosis not present

## 2014-03-28 NOTE — Progress Notes (Signed)
  Radiation Oncology         [phone]) [phone] ________________________________  Name: Wayne Roberson MRN: [id number]  Date: 03/28/2014  DOB: 02-13-1946  COMPLEX SIMULATION NOTE  NARRATIVE:  The patient was brought to the [facility] today following prostate seed implantation approximately one month ago.  Identity was confirmed.  All relevant records and images related to the planned course of therapy were reviewed.  Then, the patient was set-up supine.  CT images were obtained.  The CT images were loaded into the planning software.  Then the prostate and rectum were contoured.  Treatment planning then occurred.  The implanted iodine 125 seeds were identified by the physics staff for projection of radiation distribution  I have requested : 3D Simulation  I have requested a DVH of the following structures: Prostate and rectum.    ________________________________  Sheral Apley Tammi Klippel, M.D.

## 2014-03-28 NOTE — Progress Notes (Signed)
  Radiation Oncology         [phone]) [phone] ________________________________  Name: Wayne Roberson MRN: [id number]  Date: 03/28/2014  DOB: Mar 24, 1946  Follow-Up Visit Note  CC: Garlan Fair, MD  Garlan Fair, MD  Diagnosis:   68 y.o. gentleman with stage T1c adenocarcinoma of the prostate with a Gleason's score of 3+4 and a PSA of 5.07    ICD-9-CM ICD-10-CM   1. Prostate cancer 185 C61     Interval Since Last Radiation:  3  weeks  Narrative:  The patient returns today for routine follow-up.  He is complaining of increased urinary frequency and urinary hesitation symptoms. He filled out a questionnaire regarding urinary function today providing and overall IPSS score of 19 characterizing his symptoms as moderate.  His pre-implant score was 4. He denies any bowel symptoms.  ALLERGIES:  is allergic to penicillins.  Meds: Current Outpatient Prescriptions  Medication Sig Dispense Refill  . calcium carbonate (OS-CAL) 600 MG TABS tablet Take 600 mg by mouth daily.    . fluocinonide (LIDEX) 0.05 % external solution Apply 1 application topically 2 (two) times daily.    . Glucosamine-Chondroitin-MSM (TRIPLE FLEX PO) Take by mouth.    [name] LORazepam (ATIVAN) 0.5 MG tablet Take 0.5 mg by mouth every 8 (eight) hours.    . Multiple Vitamins-Minerals (MULTIVITAMIN WITH MINERALS) tablet Take 1 tablet by mouth daily.    . Omega-3 Fatty Acids (FISH OIL PO) Take by mouth.    . vitamin C (ASCORBIC ACID) 500 MG tablet Take 500 mg by mouth daily.     No current facility-administered medications for this encounter.    Physical Findings: The patient is in no acute distress. Patient is alert and oriented.  weight is 194 lb 8 oz (88.225 kg). His blood pressure is 144/84 and his pulse is 71. His respiration is 16. .  No significant changes.  Lab Findings: Lab Results  Component Value Date   WBC 6.3 02/25/2014   HGB 14.3 02/25/2014   HCT 43.2 02/25/2014   MCV 88.0 02/25/2014   PLT 185  02/25/2014    Radiographic Findings:  Patient underwent CT imaging in our clinic for post implant dosimetry. The CT appears to demonstrate an adequate distribution of radioactive seeds throughout the prostate gland. There no seeds in her near the rectum. I suspect the final radiation plan and dosimetry will show appropriate coverage of the prostate gland.   Impression: The patient is recovering from the effects of radiation. His urinary symptoms should gradually improve over the next 4-6 months. We talked about this today. He is encouraged by his improvement already and is otherwise please with his outcome.   Plan: Today, I spent time talking to the patient about his prostate seed implant and resolving urinary symptoms. We also talked about long-term follow-up for prostate cancer following seed implant. He understands that ongoing PSA determinations and digital rectal exams will help perform surveillance to rule out disease recurrence. He understands what to expect with his PSA measures. Patient was also educated today about some of the long-term effects from radiation including a small risk for rectal bleeding and possibly erectile dysfunction. We talked about some of the general management approaches to these potential complications. However, I did encourage the patient to contact our office or return at any point if he has questions or concerns related to his previous radiation and prostate cancer.  _____________________________________  Sheral Apley. Tammi Klippel, M.D.

## 2014-03-28 NOTE — Progress Notes (Signed)
Reports difficulty emptying different than pre seed. Reports new onset urgency. Reports nocturia x3. Denies dysuria. Denies hematuria. Denies diarrhea or constipation. Weight and vitals stable. Denies pain. Reports increased nocturia. Reports nocturia x3.

## 2014-06-17 DIAGNOSIS — C61 Malignant neoplasm of prostate: Secondary | ICD-10-CM | POA: Diagnosis not present

## 2014-06-21 NOTE — Progress Notes (Signed)
  Radiation Oncology         [phone]) [phone] ________________________________  Name: Wayne Roberson MRN: [id number]  Date: 03/28/2014  DOB: 28-Nov-1945  3D Planning Note   Prostate Brachytherapy Post-Implant Dosimetry  Diagnosis: 69 y.o. gentleman with stage T1c adenocarcinoma of the prostate with a Gleason's score of 3+4 and a PSA of 5.07  Narrative: On a previous date, Wayne Roberson returned following prostate seed implantation for post implant planning. He underwent CT scan complex simulation to delineate the three-dimensional structures of the pelvis and demonstrate the radiation distribution.  Since that time, the seed localization, and complex isodose planning with dose volume histograms have now been completed.  Results:   Prostate Coverage - The dose of radiation delivered to the 90% or more of the prostate gland (D90) was 94.78% of the prescription dose. This exceeds our goal of greater than 90%. Rectal Sparing - The volume of rectal tissue receiving the prescription dose or higher was 0.0 cc. This falls under our thresholds tolerance of 1.0 cc.  Impression: The prostate seed implant appears to show adequate target coverage and appropriate rectal sparing.  Plan:  The patient will continue to follow with urology for ongoing PSA determinations. I would anticipate a high likelihood for local tumor control with minimal risk for rectal morbidity.  ________________________________  Sheral Apley Tammi Klippel, M.D.

## 2014-06-24 DIAGNOSIS — N401 Enlarged prostate with lower urinary tract symptoms: Secondary | ICD-10-CM | POA: Diagnosis not present

## 2014-06-24 DIAGNOSIS — R350 Frequency of micturition: Secondary | ICD-10-CM | POA: Diagnosis not present

## 2014-06-24 DIAGNOSIS — C61 Malignant neoplasm of prostate: Secondary | ICD-10-CM | POA: Diagnosis not present

## 2014-07-24 DIAGNOSIS — H25013 Cortical age-related cataract, bilateral: Secondary | ICD-10-CM | POA: Diagnosis not present

## 2014-07-24 DIAGNOSIS — H40013 Open angle with borderline findings, low risk, bilateral: Secondary | ICD-10-CM | POA: Diagnosis not present

## 2014-07-24 DIAGNOSIS — H35033 Hypertensive retinopathy, bilateral: Secondary | ICD-10-CM | POA: Diagnosis not present

## 2014-07-24 DIAGNOSIS — H2513 Age-related nuclear cataract, bilateral: Secondary | ICD-10-CM | POA: Diagnosis not present

## 2014-09-25 DIAGNOSIS — C61 Malignant neoplasm of prostate: Secondary | ICD-10-CM | POA: Diagnosis not present

## 2014-10-02 DIAGNOSIS — C61 Malignant neoplasm of prostate: Secondary | ICD-10-CM | POA: Diagnosis not present

## 2014-10-09 DIAGNOSIS — Z136 Encounter for screening for cardiovascular disorders: Secondary | ICD-10-CM | POA: Diagnosis not present

## 2014-10-09 DIAGNOSIS — C61 Malignant neoplasm of prostate: Secondary | ICD-10-CM | POA: Diagnosis not present

## 2014-10-09 DIAGNOSIS — Z86010 Personal history of colonic polyps: Secondary | ICD-10-CM | POA: Diagnosis not present

## 2014-10-09 DIAGNOSIS — K824 Cholesterolosis of gallbladder: Secondary | ICD-10-CM | POA: Diagnosis not present

## 2014-10-09 DIAGNOSIS — Z0001 Encounter for general adult medical examination with abnormal findings: Secondary | ICD-10-CM | POA: Diagnosis not present

## 2014-10-09 DIAGNOSIS — G4700 Insomnia, unspecified: Secondary | ICD-10-CM | POA: Diagnosis not present

## 2014-11-15 DIAGNOSIS — M79641 Pain in right hand: Secondary | ICD-10-CM | POA: Diagnosis not present

## 2014-12-26 ENCOUNTER — Ambulatory Visit
Admission: RE | Admit: 2014-12-26 | Discharge: 2014-12-26 | Disposition: A | Discharge status: Home / Self Care | Payer: Medicare Other | Source: Ambulatory Visit | Attending: Gastroenterology | Admitting: Gastroenterology

## 2014-12-26 ENCOUNTER — Other Ambulatory Visit: Payer: Self-pay | Admitting: Nurse Practitioner

## 2014-12-26 ENCOUNTER — Other Ambulatory Visit: Payer: Self-pay | Admitting: Gastroenterology

## 2014-12-26 DIAGNOSIS — M79604 Pain in right leg: Secondary | ICD-10-CM

## 2014-12-26 DIAGNOSIS — M79661 Pain in right lower leg: Secondary | ICD-10-CM | POA: Diagnosis not present

## 2014-12-26 DIAGNOSIS — I8001 Phlebitis and thrombophlebitis of superficial vessels of right lower extremity: Secondary | ICD-10-CM | POA: Diagnosis not present

## 2014-12-27 DIAGNOSIS — C61 Malignant neoplasm of prostate: Secondary | ICD-10-CM | POA: Diagnosis not present

## 2014-12-30 DIAGNOSIS — I809 Phlebitis and thrombophlebitis of unspecified site: Secondary | ICD-10-CM | POA: Diagnosis not present

## 2015-01-06 DIAGNOSIS — R9721 Rising PSA following treatment for malignant neoplasm of prostate: Secondary | ICD-10-CM | POA: Diagnosis not present

## 2015-01-06 DIAGNOSIS — C61 Malignant neoplasm of prostate: Secondary | ICD-10-CM | POA: Diagnosis not present

## 2015-01-06 DIAGNOSIS — R351 Nocturia: Secondary | ICD-10-CM | POA: Diagnosis not present

## 2015-01-08 DIAGNOSIS — H04123 Dry eye syndrome of bilateral lacrimal glands: Secondary | ICD-10-CM | POA: Diagnosis not present

## 2015-01-08 DIAGNOSIS — D3132 Benign neoplasm of left choroid: Secondary | ICD-10-CM | POA: Diagnosis not present

## 2015-01-08 DIAGNOSIS — H40013 Open angle with borderline findings, low risk, bilateral: Secondary | ICD-10-CM | POA: Diagnosis not present

## 2015-01-08 DIAGNOSIS — H524 Presbyopia: Secondary | ICD-10-CM | POA: Diagnosis not present

## 2015-01-29 DIAGNOSIS — Z23 Encounter for immunization: Secondary | ICD-10-CM | POA: Diagnosis not present

## 2015-02-10 DIAGNOSIS — D480 Neoplasm of uncertain behavior of bone and articular cartilage: Secondary | ICD-10-CM | POA: Diagnosis not present

## 2015-02-10 DIAGNOSIS — L218 Other seborrheic dermatitis: Secondary | ICD-10-CM | POA: Diagnosis not present

## 2015-02-10 DIAGNOSIS — L821 Other seborrheic keratosis: Secondary | ICD-10-CM | POA: Diagnosis not present

## 2015-02-10 DIAGNOSIS — D1801 Hemangioma of skin and subcutaneous tissue: Secondary | ICD-10-CM | POA: Diagnosis not present

## 2015-02-10 DIAGNOSIS — L918 Other hypertrophic disorders of the skin: Secondary | ICD-10-CM | POA: Diagnosis not present

## 2015-02-10 DIAGNOSIS — L570 Actinic keratosis: Secondary | ICD-10-CM | POA: Diagnosis not present

## 2015-02-10 DIAGNOSIS — L812 Freckles: Secondary | ICD-10-CM | POA: Diagnosis not present

## 2015-02-10 DIAGNOSIS — D225 Melanocytic nevi of trunk: Secondary | ICD-10-CM | POA: Diagnosis not present

## 2015-04-01 DIAGNOSIS — C61 Malignant neoplasm of prostate: Secondary | ICD-10-CM | POA: Diagnosis not present

## 2015-04-17 DIAGNOSIS — C61 Malignant neoplasm of prostate: Secondary | ICD-10-CM | POA: Diagnosis not present

## 2015-04-17 DIAGNOSIS — R351 Nocturia: Secondary | ICD-10-CM | POA: Diagnosis not present

## 2015-04-17 DIAGNOSIS — N401 Enlarged prostate with lower urinary tract symptoms: Secondary | ICD-10-CM | POA: Diagnosis not present

## 2015-04-17 DIAGNOSIS — Z0000 Encounter for general adult medical examination without abnormal findings: Secondary | ICD-10-CM | POA: Diagnosis not present

## 2015-07-14 DIAGNOSIS — R972 Elevated prostate specific antigen [PSA]: Secondary | ICD-10-CM | POA: Diagnosis not present

## 2015-07-21 DIAGNOSIS — Z0000 Encounter for general adult medical examination without abnormal findings: Secondary | ICD-10-CM | POA: Diagnosis not present

## 2015-07-21 DIAGNOSIS — R351 Nocturia: Secondary | ICD-10-CM | POA: Diagnosis not present

## 2015-07-21 DIAGNOSIS — C61 Malignant neoplasm of prostate: Secondary | ICD-10-CM | POA: Diagnosis not present

## 2015-07-21 DIAGNOSIS — N401 Enlarged prostate with lower urinary tract symptoms: Secondary | ICD-10-CM | POA: Diagnosis not present

## 2015-07-28 DIAGNOSIS — H43393 Other vitreous opacities, bilateral: Secondary | ICD-10-CM | POA: Diagnosis not present

## 2015-07-28 DIAGNOSIS — H2513 Age-related nuclear cataract, bilateral: Secondary | ICD-10-CM | POA: Diagnosis not present

## 2015-07-28 DIAGNOSIS — H43813 Vitreous degeneration, bilateral: Secondary | ICD-10-CM | POA: Diagnosis not present

## 2015-07-28 DIAGNOSIS — H40013 Open angle with borderline findings, low risk, bilateral: Secondary | ICD-10-CM | POA: Diagnosis not present

## 2015-10-14 DIAGNOSIS — C61 Malignant neoplasm of prostate: Secondary | ICD-10-CM | POA: Diagnosis not present

## 2015-10-20 DIAGNOSIS — C61 Malignant neoplasm of prostate: Secondary | ICD-10-CM | POA: Diagnosis not present

## 2015-10-20 DIAGNOSIS — N401 Enlarged prostate with lower urinary tract symptoms: Secondary | ICD-10-CM | POA: Diagnosis not present

## 2015-10-20 DIAGNOSIS — R351 Nocturia: Secondary | ICD-10-CM | POA: Diagnosis not present

## 2015-10-23 DIAGNOSIS — Z0000 Encounter for general adult medical examination without abnormal findings: Secondary | ICD-10-CM | POA: Diagnosis not present

## 2015-10-23 DIAGNOSIS — Z1389 Encounter for screening for other disorder: Secondary | ICD-10-CM | POA: Diagnosis not present

## 2015-10-23 DIAGNOSIS — K824 Cholesterolosis of gallbladder: Secondary | ICD-10-CM | POA: Diagnosis not present

## 2015-10-23 DIAGNOSIS — R030 Elevated blood-pressure reading, without diagnosis of hypertension: Secondary | ICD-10-CM | POA: Diagnosis not present

## 2015-10-23 DIAGNOSIS — R0609 Other forms of dyspnea: Secondary | ICD-10-CM | POA: Diagnosis not present

## 2015-10-23 DIAGNOSIS — Z86010 Personal history of colonic polyps: Secondary | ICD-10-CM | POA: Diagnosis not present

## 2015-10-23 DIAGNOSIS — Z136 Encounter for screening for cardiovascular disorders: Secondary | ICD-10-CM | POA: Diagnosis not present

## 2015-10-23 DIAGNOSIS — Z6826 Body mass index (BMI) 26.0-26.9, adult: Secondary | ICD-10-CM | POA: Diagnosis not present

## 2015-10-23 DIAGNOSIS — C61 Malignant neoplasm of prostate: Secondary | ICD-10-CM | POA: Diagnosis not present

## 2015-10-23 DIAGNOSIS — Z87891 Personal history of nicotine dependence: Secondary | ICD-10-CM | POA: Diagnosis not present

## 2015-10-31 ENCOUNTER — Telehealth: Payer: Self-pay | Admitting: Acute Care

## 2015-11-03 NOTE — Telephone Encounter (Signed)
Called spoke with pt. Pt does not qualify for the program due to his personal history of cancer. Pt is aware and Dr. Durenda Age office has been notified Nothing further needed at this time.

## 2015-11-03 NOTE — Telephone Encounter (Signed)
Will forward to [facility] for follow up

## 2015-11-03 NOTE — Telephone Encounter (Signed)
Pt cb [phone]

## 2015-11-20 ENCOUNTER — Telehealth: Payer: Self-pay | Admitting: Acute Care

## 2015-11-20 NOTE — Telephone Encounter (Signed)
Referral was canceled on 11/03/15 for the lung cancer screening program due to pt's cancer history. I sent notification to Dr. Durenda Age office on 11/03/15 via fax. Nothing further needed.

## 2015-12-03 ENCOUNTER — Ambulatory Visit
Admission: RE | Admit: 2015-12-03 | Discharge: 2015-12-03 | Disposition: A | Discharge status: Home / Self Care | Payer: Medicare Other | Source: Ambulatory Visit | Attending: Gastroenterology | Admitting: Gastroenterology

## 2015-12-03 ENCOUNTER — Other Ambulatory Visit: Payer: Self-pay | Admitting: Gastroenterology

## 2015-12-03 DIAGNOSIS — R0609 Other forms of dyspnea: Secondary | ICD-10-CM

## 2015-12-03 DIAGNOSIS — I10 Essential (primary) hypertension: Secondary | ICD-10-CM | POA: Diagnosis not present

## 2015-12-03 DIAGNOSIS — R05 Cough: Secondary | ICD-10-CM | POA: Diagnosis not present

## 2015-12-05 ENCOUNTER — Other Ambulatory Visit (HOSPITAL_COMMUNITY): Payer: Self-pay | Admitting: Respiratory Therapy

## 2015-12-05 DIAGNOSIS — J449 Chronic obstructive pulmonary disease, unspecified: Secondary | ICD-10-CM

## 2015-12-05 DIAGNOSIS — R0609 Other forms of dyspnea: Principal | ICD-10-CM

## 2015-12-12 ENCOUNTER — Ambulatory Visit (HOSPITAL_COMMUNITY)
Admission: RE | Admit: 2015-12-12 | Discharge: 2015-12-12 | Disposition: A | Discharge status: Home / Self Care | Payer: Medicare Other | Source: Ambulatory Visit | Attending: Gastroenterology | Admitting: Gastroenterology

## 2015-12-12 DIAGNOSIS — R0609 Other forms of dyspnea: Secondary | ICD-10-CM | POA: Insufficient documentation

## 2015-12-12 DIAGNOSIS — J449 Chronic obstructive pulmonary disease, unspecified: Secondary | ICD-10-CM | POA: Diagnosis not present

## 2015-12-12 LAB — PULMONARY FUNCTION TEST
DL/VA % PRED: 78 %
DL/VA: 3.61 ml/min/mmHg/L
DLCO unc % pred: 80 %
DLCO unc: 26.1 ml/min/mmHg
FEF 25-75 POST: 2.93 L/s
FEF 25-75 Pre: 2.29 L/sec
FEF2575-%CHANGE-POST: 28 %
FEF2575-%Pred-Post: 123 %
FEF2575-%Pred-Pre: 96 %
FEV1-%Change-Post: 6 %
FEV1-%PRED-POST: 112 %
FEV1-%Pred-Pre: 105 %
FEV1-PRE: 3.34 L
FEV1-Post: 3.56 L
FEV1FVC-%Change-Post: -2 %
FEV1FVC-%PRED-PRE: 98 %
FEV6-%CHANGE-POST: 7 %
FEV6-%Pred-Post: 117 %
FEV6-%Pred-Pre: 109 %
FEV6-Post: 4.8 L
FEV6-Pre: 4.47 L
FEV6FVC-%Change-Post: -1 %
FEV6FVC-%Pred-Post: 100 %
FEV6FVC-%Pred-Pre: 102 %
FVC-%CHANGE-POST: 9 %
FVC-%PRED-PRE: 107 %
FVC-%Pred-Post: 116 %
FVC-POST: 5.08 L
FVC-PRE: 4.66 L
Post FEV1/FVC ratio: 70 %
Post FEV6/FVC ratio: 95 %
Pre FEV1/FVC ratio: 72 %
Pre FEV6/FVC Ratio: 96 %
RV % pred: 109 %
RV: 2.72 L
TLC % PRED: 106 %
TLC: 7.51 L

## 2015-12-12 MED ORDER — ALBUTEROL SULFATE (2.5 MG/3ML) 0.083% IN NEBU
2.5000 mg | INHALATION_SOLUTION | Freq: Once | RESPIRATORY_TRACT | Status: AC
  Administered 2015-12-12: 2.5 mg via RESPIRATORY_TRACT

## 2015-12-29 DIAGNOSIS — H40013 Open angle with borderline findings, low risk, bilateral: Secondary | ICD-10-CM | POA: Diagnosis not present

## 2015-12-29 DIAGNOSIS — H524 Presbyopia: Secondary | ICD-10-CM | POA: Diagnosis not present

## 2015-12-29 DIAGNOSIS — H04123 Dry eye syndrome of bilateral lacrimal glands: Secondary | ICD-10-CM | POA: Diagnosis not present

## 2016-01-05 DIAGNOSIS — Z23 Encounter for immunization: Secondary | ICD-10-CM | POA: Diagnosis not present

## 2016-01-13 DIAGNOSIS — C61 Malignant neoplasm of prostate: Secondary | ICD-10-CM | POA: Diagnosis not present

## 2016-01-19 DIAGNOSIS — C61 Malignant neoplasm of prostate: Secondary | ICD-10-CM | POA: Diagnosis not present

## 2016-01-19 DIAGNOSIS — R351 Nocturia: Secondary | ICD-10-CM | POA: Diagnosis not present

## 2016-02-10 DIAGNOSIS — L814 Other melanin hyperpigmentation: Secondary | ICD-10-CM | POA: Diagnosis not present

## 2016-02-10 DIAGNOSIS — D485 Neoplasm of uncertain behavior of skin: Secondary | ICD-10-CM | POA: Diagnosis not present

## 2016-02-10 DIAGNOSIS — D1801 Hemangioma of skin and subcutaneous tissue: Secondary | ICD-10-CM | POA: Diagnosis not present

## 2016-02-10 DIAGNOSIS — L218 Other seborrheic dermatitis: Secondary | ICD-10-CM | POA: Diagnosis not present

## 2016-02-10 DIAGNOSIS — L570 Actinic keratosis: Secondary | ICD-10-CM | POA: Diagnosis not present

## 2016-02-10 DIAGNOSIS — D2271 Melanocytic nevi of right lower limb, including hip: Secondary | ICD-10-CM | POA: Diagnosis not present

## 2016-02-10 DIAGNOSIS — L918 Other hypertrophic disorders of the skin: Secondary | ICD-10-CM | POA: Diagnosis not present

## 2016-02-10 DIAGNOSIS — L821 Other seborrheic keratosis: Secondary | ICD-10-CM | POA: Diagnosis not present

## 2016-03-29 HISTORY — PX: CATARACT EXTRACTION, BILATERAL: SHX1313

## 2016-04-12 DIAGNOSIS — C61 Malignant neoplasm of prostate: Secondary | ICD-10-CM | POA: Diagnosis not present

## 2016-04-19 DIAGNOSIS — C61 Malignant neoplasm of prostate: Secondary | ICD-10-CM | POA: Diagnosis not present

## 2016-04-19 DIAGNOSIS — R351 Nocturia: Secondary | ICD-10-CM | POA: Diagnosis not present

## 2016-07-12 DIAGNOSIS — C61 Malignant neoplasm of prostate: Secondary | ICD-10-CM | POA: Diagnosis not present

## 2016-07-19 DIAGNOSIS — C61 Malignant neoplasm of prostate: Secondary | ICD-10-CM | POA: Diagnosis not present

## 2016-07-19 DIAGNOSIS — R351 Nocturia: Secondary | ICD-10-CM | POA: Diagnosis not present

## 2016-07-21 DIAGNOSIS — R079 Chest pain, unspecified: Secondary | ICD-10-CM | POA: Diagnosis not present

## 2016-07-21 DIAGNOSIS — R0602 Shortness of breath: Secondary | ICD-10-CM | POA: Diagnosis not present

## 2016-07-28 ENCOUNTER — Ambulatory Visit (INDEPENDENT_AMBULATORY_CARE_PROVIDER_SITE_OTHER): Payer: Medicare Other | Admitting: Cardiology

## 2016-07-28 ENCOUNTER — Encounter (INDEPENDENT_AMBULATORY_CARE_PROVIDER_SITE_OTHER): Payer: Self-pay

## 2016-07-28 ENCOUNTER — Encounter: Payer: Self-pay | Admitting: Cardiology

## 2016-07-28 VITALS — BP 142/88 | HR 55 | Ht 71.0 in | Wt 197.0 lb

## 2016-07-28 DIAGNOSIS — R0602 Shortness of breath: Secondary | ICD-10-CM | POA: Diagnosis not present

## 2016-07-28 DIAGNOSIS — R0789 Other chest pain: Secondary | ICD-10-CM | POA: Diagnosis not present

## 2016-07-28 DIAGNOSIS — Z87891 Personal history of nicotine dependence: Secondary | ICD-10-CM | POA: Diagnosis not present

## 2016-07-28 NOTE — Progress Notes (Signed)
Cardiology Office Note:    Date:  07/28/2016   ID:  Wayne Roberson, DOB Sep 29, 1945, MRN [id number]  PCP:  Garlan Fair, MD  Cardiologist:  [name], MD     Referring MD: Garlan Fair, MD   Chief Complaint  Patient presents with  . New Patient (Initial Visit)    chest pain, sob    History of Present Illness:    Wayne Roberson is a 71 y.o. male here for evaluation of chest pain and shortness of breath. He was seen by Dr. Wynetta Emery on 07/01/16 with unexplained shortness of breath and exertional atypical left anterior chest discomfort. It was sharp, achy at times and did not seem to be exertional. It occurred intermittently. Noted SOB with walking up stairs, cramping in back of legs as well.  Previously pulmonary function testing was normal. No prior history of heart disease. No early family history of coronary artery disease.  Play golf, yard work.  He did smoke and stopped in 2006.  Take care of his wife, Candice.   Past Medical History:  Diagnosis Date  . Arthritis    knees  . Prostate cancer ([place])   . Trace cataracts     Past Surgical History:  Procedure Laterality Date  . PROSTATE BIOPSY    . RADIOACTIVE SEED IMPLANT N/A 03/01/2014   Procedure: RADIOACTIVE SEED IMPLANT;  Surgeon: [name], MD;  [facility];  Service: Urology;  Laterality: N/A;  . VENTRAL HERNIA REPAIR      Current Medications: Current Meds  Medication Sig  . aspirin EC 81 MG tablet Take 81 mg by mouth daily.  . calcium carbonate (OS-CAL) 600 MG TABS tablet Take 600 mg by mouth daily.  . fluocinonide (LIDEX) 0.05 % external solution Apply 1 application topically 2 (two) times daily.  . Glucosamine-Chondroitin-MSM (TRIPLE FLEX PO) Take 1 capsule by mouth daily.   [name] LORazepam (ATIVAN) 0.5 MG tablet Take 0.5 mg by mouth daily as needed for sleep.   . Multiple Vitamins-Minerals (MULTIVITAMIN WITH MINERALS) tablet Take 1 tablet by mouth daily.  . naproxen sodium  (ANAPROX) 220 MG tablet Take 440 mg by mouth 2 (two) times daily as needed (pain).  . Omega-3 Fatty Acids (FISH OIL PO) Take 1 capsule by mouth daily.   . vitamin C (ASCORBIC ACID) 500 MG tablet Take 500 mg by mouth daily.     Allergies:   Penicillins   Social History   Social History  . Marital status: Married    Spouse name: N/A  . Number of children: N/A  . Years of education: N/A   Social History Main Topics  . Smoking status: Former Smoker    Packs/day: 2.00    Years: 42.00    Types: Cigarettes    Quit date: 03/29/2004  . Smokeless tobacco: Never Used  . Alcohol use 7.2 oz/week    10 Glasses of wine, 2 Standard drinks or equivalent per week  . Drug use: No  . Sexual activity: No   Other Topics Concern  . None   Social History Narrative  . None     family history includes Heart failure in his father and mother. There is no history of Cancer. ROS:   Please see the history of present illness.     All other systems reviewed and are negative.   EKGs/Labs/Other Studies Reviewed:    EKG:  EKG is  ordered today.  The ekg ordered today demonstrates 07/28/16-sinus bradycardia rate 55  with no other abnormalities.   Recent Labs: No results found for requested labs within last 8760 hours.   Recent Lipid Panel No results found for: CHOL, TRIG, HDL, CHOLHDL, VLDL, LDLCALC, LDLDIRECT  Physical Exam:    VS:  BP (!) 142/88   Pulse (!) 55   Ht 5\' 11"  (1.803 m)   Wt 197 lb (89.4 kg)   BMI 27.48 kg/m     Wt Readings from Last 3 Encounters:  07/28/16 197 lb (89.4 kg)  03/28/14 194 lb 8 oz (88.2 kg)  03/01/14 187 lb (84.8 kg)     GEN:  Well nourished, well developed in no acute distress HEENT: Normal NECK: No JVD; No carotid bruits LYMPHATICS: No lymphadenopathy CARDIAC: RRR, no murmurs, rubs, gallops RESPIRATORY:  Clear to auscultation without rales, wheezing or rhonchi  ABDOMEN: Soft, non-tender, non-distended MUSCULOSKELETAL:  No edema; No deformity  SKIN: Warm  and dry NEUROLOGIC:  Alert and oriented x 3 PSYCHIATRIC:  Normal affect   ASSESSMENT:    1. Atypical chest pain   2. Shortness of breath   3. Former smoker    PLAN:    In order of problems listed above:  Dyspnea on exertion and atypical chest pain  - Given his long-standing history of smoking, we will check an exercise treadmill test to ensure that there is no evidence of ischemia.  - I will also check an echocardiogram to ensure proper structure and function of his heart. I do not appreciate any significant murmurs. No JVD. No orthopnea.  - His spirometry was reviewed from 12/05/15 - was interpreted by Dr.Wert  as minimal obstructive disease. FEV1 predicted was 100%. His chest x-ray does show mild hyperinflation.  - His years of smoking may still be playing somewhat of a role in his dyspnea.  Elevated blood pressure without diagnosis of hypertension  - His blood pressure today is mildly elevated. It is usually like this at home when he checks it. I would consider starting him on an antihypertensive to assist him with this.   Medication Adjustments/Labs and Tests Ordered: Current medicines are reviewed at length with the patient today.  Concerns regarding medicines are outlined above. Labs and tests ordered and medication changes are outlined in the patient instructions below:  Patient Instructions  Medication Instructions:  Your physician recommends that you continue on your current medications as directed. Please refer to the Current Medication list given to you today.   Labwork: None  Testing/Procedures: Your physician has requested that you have an echocardiogram. Echocardiography is a painless test that uses sound waves to create images of your heart. It provides your doctor with information about the size and shape of your heart and how well your heart's chambers and valves are working. This procedure takes approximately one hour. There are no restrictions for this  procedure.  Your physician has requested that you have an exercise tolerance test. For further information please visit HugeFiesta.[email or web address]. Please also follow instruction sheet, as given.  Follow-Up: Your physician recommends that you schedule a follow-up appointment AS NEEDED with Dr. Marlou Porch pending study results.  Any Other Special Instructions Will Be Listed Below (If Applicable).     If you need a refill on your cardiac medications before your next appointment, please call your pharmacy.      Signed, [name], MD  07/28/2016 12:21 PM    [facility]

## 2016-07-28 NOTE — Patient Instructions (Signed)
Medication Instructions:  Your physician recommends that you continue on your current medications as directed. Please refer to the Current Medication list given to you today.   Labwork: None  Testing/Procedures: Your physician has requested that you have an echocardiogram. Echocardiography is a painless test that uses sound waves to create images of your heart. It provides your doctor with information about the size and shape of your heart and how well your heart's chambers and valves are working. This procedure takes approximately one hour. There are no restrictions for this procedure.  Your physician has requested that you have an exercise tolerance test. For further information please visit HugeFiesta[facility][email or web address]. Please also follow instruction sheet, as given.  Follow-Up: Your physician recommends that you schedule a follow-up appointment AS NEEDED with Dr. Marlou Porch pending study results.  Any Other Special Instructions Will Be Listed Below (If Applicable).     If you need a refill on your cardiac medications before your next appointment, please call your pharmacy.

## 2016-08-02 DIAGNOSIS — H35033 Hypertensive retinopathy, bilateral: Secondary | ICD-10-CM | POA: Diagnosis not present

## 2016-08-02 DIAGNOSIS — H25013 Cortical age-related cataract, bilateral: Secondary | ICD-10-CM | POA: Diagnosis not present

## 2016-08-02 DIAGNOSIS — H2513 Age-related nuclear cataract, bilateral: Secondary | ICD-10-CM | POA: Diagnosis not present

## 2016-08-02 DIAGNOSIS — H40013 Open angle with borderline findings, low risk, bilateral: Secondary | ICD-10-CM | POA: Diagnosis not present

## 2016-08-04 ENCOUNTER — Ambulatory Visit: Payer: Medicare Other | Admitting: Cardiology

## 2016-08-09 DIAGNOSIS — M79641 Pain in right hand: Secondary | ICD-10-CM | POA: Diagnosis not present

## 2016-08-10 ENCOUNTER — Other Ambulatory Visit: Payer: Self-pay

## 2016-08-10 ENCOUNTER — Ambulatory Visit (HOSPITAL_COMMUNITY): Payer: Medicare Other | Attending: Cardiology

## 2016-08-10 ENCOUNTER — Ambulatory Visit (INDEPENDENT_AMBULATORY_CARE_PROVIDER_SITE_OTHER): Payer: Medicare Other

## 2016-08-10 DIAGNOSIS — R0602 Shortness of breath: Secondary | ICD-10-CM

## 2016-08-10 DIAGNOSIS — I5189 Other ill-defined heart diseases: Secondary | ICD-10-CM | POA: Diagnosis not present

## 2016-08-10 DIAGNOSIS — R0789 Other chest pain: Secondary | ICD-10-CM | POA: Diagnosis not present

## 2016-08-10 LAB — EXERCISE TOLERANCE TEST
CHL CUP MPHR: 148 {beats}/min
CSEPEW: 10.1 METS
CSEPPHR: 131 {beats}/min
Exercise duration (min): 8 min
Exercise duration (sec): 44 s
Percent HR: 88 %
RPE: 17
Rest HR: 64 {beats}/min

## 2016-08-12 ENCOUNTER — Telehealth: Payer: Self-pay | Admitting: *Deleted

## 2016-08-12 NOTE — Telephone Encounter (Signed)
-----   Message from Jerline Pain, MD sent at 08/12/2016  6:29 AM EDT ----- Reassuring treadmill test. No evidence of ischemia.  [name], MD

## 2016-08-12 NOTE — Telephone Encounter (Signed)
Wife informed and copy sent to PCP 

## 2016-08-12 NOTE — Telephone Encounter (Signed)
-----   Message from Jerline Pain, MD sent at 08/12/2016  6:28 AM EDT ----- Reassuring echo, normal pump function.  [name], MD

## 2016-09-01 DIAGNOSIS — R0609 Other forms of dyspnea: Secondary | ICD-10-CM | POA: Diagnosis not present

## 2016-09-01 DIAGNOSIS — I10 Essential (primary) hypertension: Secondary | ICD-10-CM | POA: Diagnosis not present

## 2016-09-02 ENCOUNTER — Telehealth: Payer: Self-pay | Admitting: Internal Medicine

## 2016-09-02 NOTE — Telephone Encounter (Signed)
Records received from Dr. Hassell Done Johnson's office.  Called patient and he stated he is not due for a recall colon until 05/2018.  Records placed in records reviewed folder

## 2016-10-06 DIAGNOSIS — H2513 Age-related nuclear cataract, bilateral: Secondary | ICD-10-CM | POA: Diagnosis not present

## 2016-10-06 DIAGNOSIS — H2511 Age-related nuclear cataract, right eye: Secondary | ICD-10-CM | POA: Diagnosis not present

## 2016-11-09 DIAGNOSIS — H2512 Age-related nuclear cataract, left eye: Secondary | ICD-10-CM | POA: Diagnosis not present

## 2016-11-09 DIAGNOSIS — H2511 Age-related nuclear cataract, right eye: Secondary | ICD-10-CM | POA: Diagnosis not present

## 2016-11-10 DIAGNOSIS — H2512 Age-related nuclear cataract, left eye: Secondary | ICD-10-CM | POA: Diagnosis not present

## 2016-11-15 DIAGNOSIS — D126 Benign neoplasm of colon, unspecified: Secondary | ICD-10-CM | POA: Diagnosis not present

## 2016-11-15 DIAGNOSIS — Z8546 Personal history of malignant neoplasm of prostate: Secondary | ICD-10-CM | POA: Diagnosis not present

## 2016-11-15 DIAGNOSIS — G4700 Insomnia, unspecified: Secondary | ICD-10-CM | POA: Diagnosis not present

## 2016-11-16 DIAGNOSIS — H2512 Age-related nuclear cataract, left eye: Secondary | ICD-10-CM | POA: Diagnosis not present

## 2017-01-12 DIAGNOSIS — C61 Malignant neoplasm of prostate: Secondary | ICD-10-CM | POA: Diagnosis not present

## 2017-01-19 DIAGNOSIS — Z23 Encounter for immunization: Secondary | ICD-10-CM | POA: Diagnosis not present

## 2017-02-14 DIAGNOSIS — L820 Inflamed seborrheic keratosis: Secondary | ICD-10-CM | POA: Diagnosis not present

## 2017-02-14 DIAGNOSIS — L570 Actinic keratosis: Secondary | ICD-10-CM | POA: Diagnosis not present

## 2017-02-14 DIAGNOSIS — D225 Melanocytic nevi of trunk: Secondary | ICD-10-CM | POA: Diagnosis not present

## 2017-02-14 DIAGNOSIS — L821 Other seborrheic keratosis: Secondary | ICD-10-CM | POA: Diagnosis not present

## 2017-02-14 DIAGNOSIS — D1801 Hemangioma of skin and subcutaneous tissue: Secondary | ICD-10-CM | POA: Diagnosis not present

## 2017-02-24 DIAGNOSIS — R351 Nocturia: Secondary | ICD-10-CM | POA: Diagnosis not present

## 2017-02-24 DIAGNOSIS — C61 Malignant neoplasm of prostate: Secondary | ICD-10-CM | POA: Diagnosis not present

## 2017-03-14 DIAGNOSIS — Z1389 Encounter for screening for other disorder: Secondary | ICD-10-CM | POA: Diagnosis not present

## 2017-03-14 DIAGNOSIS — Z8546 Personal history of malignant neoplasm of prostate: Secondary | ICD-10-CM | POA: Diagnosis not present

## 2017-03-14 DIAGNOSIS — Z1159 Encounter for screening for other viral diseases: Secondary | ICD-10-CM | POA: Diagnosis not present

## 2017-03-14 DIAGNOSIS — Z0000 Encounter for general adult medical examination without abnormal findings: Secondary | ICD-10-CM | POA: Diagnosis not present

## 2017-04-04 DIAGNOSIS — H04123 Dry eye syndrome of bilateral lacrimal glands: Secondary | ICD-10-CM | POA: Diagnosis not present

## 2017-04-04 DIAGNOSIS — H40013 Open angle with borderline findings, low risk, bilateral: Secondary | ICD-10-CM | POA: Diagnosis not present

## 2017-04-04 DIAGNOSIS — H40051 Ocular hypertension, right eye: Secondary | ICD-10-CM | POA: Diagnosis not present

## 2017-05-11 DIAGNOSIS — J0100 Acute maxillary sinusitis, unspecified: Secondary | ICD-10-CM | POA: Diagnosis not present

## 2017-08-03 DIAGNOSIS — H43393 Other vitreous opacities, bilateral: Secondary | ICD-10-CM | POA: Diagnosis not present

## 2017-08-03 DIAGNOSIS — I7090 Unspecified atherosclerosis: Secondary | ICD-10-CM | POA: Diagnosis not present

## 2017-08-03 DIAGNOSIS — H35033 Hypertensive retinopathy, bilateral: Secondary | ICD-10-CM | POA: Diagnosis not present

## 2017-08-03 DIAGNOSIS — H40013 Open angle with borderline findings, low risk, bilateral: Secondary | ICD-10-CM | POA: Diagnosis not present

## 2017-08-16 DIAGNOSIS — C61 Malignant neoplasm of prostate: Secondary | ICD-10-CM | POA: Diagnosis not present

## 2017-09-08 DIAGNOSIS — C61 Malignant neoplasm of prostate: Secondary | ICD-10-CM | POA: Diagnosis not present

## 2017-09-08 DIAGNOSIS — R351 Nocturia: Secondary | ICD-10-CM | POA: Diagnosis not present

## 2017-09-08 DIAGNOSIS — R030 Elevated blood-pressure reading, without diagnosis of hypertension: Secondary | ICD-10-CM | POA: Diagnosis not present

## 2017-09-20 DIAGNOSIS — R030 Elevated blood-pressure reading, without diagnosis of hypertension: Secondary | ICD-10-CM | POA: Diagnosis not present

## 2017-10-06 DIAGNOSIS — M25561 Pain in right knee: Secondary | ICD-10-CM | POA: Diagnosis not present

## 2017-11-15 DIAGNOSIS — M1711 Unilateral primary osteoarthritis, right knee: Secondary | ICD-10-CM | POA: Diagnosis not present

## 2017-11-22 DIAGNOSIS — M1711 Unilateral primary osteoarthritis, right knee: Secondary | ICD-10-CM | POA: Diagnosis not present

## 2017-11-29 DIAGNOSIS — M1711 Unilateral primary osteoarthritis, right knee: Secondary | ICD-10-CM | POA: Diagnosis not present

## 2018-01-04 DIAGNOSIS — J0100 Acute maxillary sinusitis, unspecified: Secondary | ICD-10-CM | POA: Diagnosis not present

## 2018-01-04 DIAGNOSIS — R030 Elevated blood-pressure reading, without diagnosis of hypertension: Secondary | ICD-10-CM | POA: Diagnosis not present

## 2018-01-24 DIAGNOSIS — M25561 Pain in right knee: Secondary | ICD-10-CM | POA: Diagnosis not present

## 2018-01-30 DIAGNOSIS — Z23 Encounter for immunization: Secondary | ICD-10-CM | POA: Diagnosis not present

## 2018-01-30 DIAGNOSIS — J069 Acute upper respiratory infection, unspecified: Secondary | ICD-10-CM | POA: Diagnosis not present

## 2018-01-31 ENCOUNTER — Encounter: Payer: Self-pay | Admitting: Cardiology

## 2018-01-31 ENCOUNTER — Ambulatory Visit (INDEPENDENT_AMBULATORY_CARE_PROVIDER_SITE_OTHER): Payer: Medicare Other | Admitting: Cardiology

## 2018-01-31 ENCOUNTER — Encounter: Payer: Self-pay | Admitting: *Deleted

## 2018-01-31 VITALS — BP 140/90 | HR 75 | Ht 71.0 in | Wt 192.2 lb

## 2018-01-31 DIAGNOSIS — Z01812 Encounter for preprocedural laboratory examination: Secondary | ICD-10-CM | POA: Diagnosis not present

## 2018-01-31 DIAGNOSIS — I209 Angina pectoris, unspecified: Secondary | ICD-10-CM

## 2018-01-31 DIAGNOSIS — I10 Essential (primary) hypertension: Secondary | ICD-10-CM | POA: Diagnosis not present

## 2018-01-31 DIAGNOSIS — R0602 Shortness of breath: Secondary | ICD-10-CM | POA: Diagnosis not present

## 2018-01-31 MED ORDER — LOSARTAN POTASSIUM-HCTZ 50-12.5 MG PO TABS: 1.0000 | ORAL_TABLET | Freq: Every day | ORAL | 3 refills | Status: DC

## 2018-01-31 MED ORDER — METOPROLOL TARTRATE 50 MG PO TABS: 50.0000 mg | ORAL_TABLET | Freq: Once | ORAL | 0 refills | Status: DC

## 2018-01-31 NOTE — Progress Notes (Signed)
Cardiology Office Note:    Date:  01/31/2018   ID:  Wayne Roberson, DOB October 11, 1945, MRN [id number]  PCP:  [name], MD  Cardiologist:  [name], MD     Referring MD: Garlan Fair, MD   No chief complaint on file.   History of Present Illness:    Wayne Roberson is a 72 y.o. male here for evaluation of chest pain and shortness of breath and fatigue. He was seen by Dr. Wynetta Emery on 07/01/16 with unexplained shortness of breath and exertional atypical left anterior chest discomfort.  Sometimes feels under his left pectoralis muscle intermittently.  Has been feeling more and more fatigue.  He still does his own yard work for instance however.  Stress test was done which showed hypertensive response to exercise.  He also had an echocardiogram which showed normal EF.  He did have a chest x-ray that showed mild hyperinflation and pulmonary function studies that showed minimal airway obstruction   No early family history of coronary artery disease.  Been having knee pain, right side.  Going for knee replacement in the beginning of the year.  Play golf, yard work.  He did smoke and stopped in 2006.  wife, Candice.   Past Medical History:  Diagnosis Date  . Arthritis    knees  . Prostate cancer ([place])   . Trace cataracts     Past Surgical History:  Procedure Laterality Date  . PROSTATE BIOPSY    . RADIOACTIVE SEED IMPLANT N/A 03/01/2014   Procedure: RADIOACTIVE SEED IMPLANT;  Surgeon: [name], MD;  Location: [facility];  Service: Urology;  Laterality: N/A;  . VENTRAL HERNIA REPAIR      Current Medications: Current Meds  Medication Sig  . aspirin EC 81 MG tablet Take 81 mg by mouth daily.  . fluocinonide (LIDEX) 0.05 % external solution Apply 1 application topically 2 (two) times daily.  . Glucosamine-Chondroitin-MSM (TRIPLE FLEX PO) Take 1 capsule by mouth 2 (two) times daily.   [name] LORazepam (ATIVAN) 0.5 MG tablet Take 0.5 mg by mouth daily as needed  for sleep.   . Multiple Vitamins-Minerals (MULTIVITAMIN WITH MINERALS) tablet Take 1 tablet by mouth daily.  . naproxen sodium (ANAPROX) 220 MG tablet Take 440 mg by mouth 2 (two) times daily as needed (pain).  . vitamin C (ASCORBIC ACID) 500 MG tablet Take 500 mg by mouth daily.     Allergies:   Penicillins   Social History   Socioeconomic History  . Marital status: Married    Spouse name: Not on file  . Number of children: Not on file  . Years of education: Not on file  . Highest education level: Not on file  Occupational History  . Not on file  Social Needs  . Financial resource strain: Not on file  . Food insecurity:    Worry: Not on file    Inability: Not on file  . Transportation needs:    Medical: Not on file    Non-medical: Not on file  Tobacco Use  . Smoking status: Former Smoker    Packs/day: 2.00    Years: 42.00    Pack years: 84.00    Types: Cigarettes    Last attempt to quit: 03/29/2004    Years since quitting: 13.8  . Smokeless tobacco: Never Used  Substance and Sexual Activity  . Alcohol use: Yes    Alcohol/week: 12.0 standard drinks    Types: 10 Glasses of wine, 2 Standard  drinks or equivalent per week  . Drug use: No  . Sexual activity: Never  Lifestyle  . Physical activity:    Days per week: Not on file    Minutes per session: Not on file  . Stress: Not on file  Relationships  . Social connections:    Talks on phone: Not on file    Gets together: Not on file    Attends religious service: Not on file    Active member of club or organization: Not on file    Attends meetings of clubs or organizations: Not on file    Relationship status: Not on file  Other Topics Concern  . Not on file  Social History Narrative  . Not on file     family history includes Heart failure in his father and mother. There is no history of Cancer. ROS:   Please see the history of present illness.   Denies any fevers chills nausea vomiting syncope bleeding  All other  systems reviewed and are negative.   EKGs/Labs/Other Studies Reviewed:    EKG:  EKG is ordered today.  The ekg ordered today demonstrates 01/31/2018-sinus rhythm 75 with no other significant abnormalities.  Personally reviewed and interpreted.  07/28/16-sinus bradycardia rate 55 with no other abnormalities.   Recent Labs: No results found for requested labs within last 8760 hours.   Recent Lipid Panel No results found for: CHOL, TRIG, HDL, CHOLHDL, VLDL, LDLCALC, LDLDIRECT  Physical Exam:    VS:  BP 140/90   Pulse 75   Ht 5\' 11"  (1.803 m)   Wt 192 lb 3.2 oz (87.2 kg)   BMI 26.81 kg/m     Wt Readings from Last 3 Encounters:  01/31/18 192 lb 3.2 oz (87.2 kg)  07/28/16 197 lb (89.4 kg)  03/28/14 194 lb 8 oz (88.2 kg)     GEN:  Well nourished, well developed in no acute distress HEENT: Normal NECK: No JVD; No carotid bruits LYMPHATICS: No lymphadenopathy CARDIAC: RRR, no murmurs, rubs, gallops RESPIRATORY:  Clear to auscultation without rales, wheezing or rhonchi  ABDOMEN: Soft, non-tender, non-distended MUSCULOSKELETAL:  No edema; No deformity  SKIN: Warm and dry NEUROLOGIC:  Alert and oriented x 3 PSYCHIATRIC:  Normal affect   ASSESSMENT:    1. Angina pectoris ([place])   2. Shortness of breath   3. Pre-procedure lab exam   4. Essential hypertension    PLAN:    In order of problems listed above:  Dyspnea on exertion and atypical chest pain  -I think it makes sense given the escalation in his symptoms to proceed with coronary CTA with FFR analysis.  Metoprolol pre-CT.  -We will also optimize his blood pressure control and start losartan 50 HCTZ 12.5.  Many of his blood pressure readings at home are in the stage II hypertension range.  [name] has been monitoring his blood pressures.  - Given his long-standing history of smoking at prior visit in 2018 we checked an exercise tolerance test results as below:  Blood pressure demonstrated a hypertensive response to  exercise.  No T wave inversion was noted during stress.  There was no ST segment deviation noted during stress.  Blood pressure demonstrated a hypertensive response to exercise  Overall, the patient's exercise capacity was normal.  [facility] Treadmill Score: low risk  Arrhythmias during stress: rare PVCs. Arrhythmias during recovery: rare PVCs   Negative stress test without evidence of ischemia at given workload. Rare PVC's noted with exercise and in recovery. Hypertensive  response to exercise.   -Echocardiogram from 2018 shows the following: - Left ventricle: The cavity size was normal. Wall thickness was   normal. Systolic function was normal. The estimated ejection   fraction was in the range of 55% to 60%. Wall motion was normal;   there were no regional wall motion abnormalities. Doppler   parameters are consistent with abnormal left ventricular   relaxation (grade 1 diastolic dysfunction).  Impressions:  - Normal LV systolic function; mild diastolic dysfunction.   - His spirometry was reviewed from 12/05/15 - was interpreted by Dr.Wert  as minimal obstructive disease. FEV1 predicted was 100%. His chest x-ray does show mild hyperinflation.  - His years of smoking may still be playing somewhat of a role in his dyspnea.  Essential hypertension  -He showed me his blood pressure readings at home, many which are in the stage II hypertension range with systolics in the 914N. - I will go ahead and start him on losartan 50 hydrochlorothiazide 12.5.  He has tried dietary modifications.  Low-salt.  We will follow-up with results of CT scan.  If this is reassuring from a cardiac perspective, may consider referral to pulmonary clinic.  [name] can continue to monitor blood pressure.   Medication Adjustments/Labs and Tests Ordered: Current medicines are reviewed at length with the patient today.  Concerns regarding medicines are outlined above. Labs and tests ordered and medication  changes are outlined in the patient instructions below:  Patient Instructions  Medication Instructions:  Please start Losartan 50-12.5 mg one tablet by mouth daily. Continue all other medications as listed.  If you need a refill on your cardiac medications before your next appointment, please call your pharmacy.   Lab work: Please have blood work before your Coronary CT scan.  This will be scheduled once the CT has been scheduled.  Testing/Procedures: Coronary CT-scan of the chest  Follow-Up: Follow up will be based on the results of the above testing.  Thank you for choosing [facility]!!         Signed, [name], MD  01/31/2018 5:27 PM    [facility]

## 2018-01-31 NOTE — Patient Instructions (Signed)
Medication Instructions:  Please start Losartan 50-12.5 mg one tablet by mouth daily. Continue all other medications as listed.  If you need a refill on your cardiac medications before your next appointment, please call your pharmacy.   Lab work: Please have blood work before your Coronary CT scan.  This will be scheduled once the CT has been scheduled.  Testing/Procedures: Coronary CT-scan of the chest  Follow-Up: Follow up will be based on the results of the above testing.  Thank you for choosing [facility]!!

## 2018-02-01 ENCOUNTER — Telehealth: Payer: Self-pay | Admitting: Cardiology

## 2018-02-01 MED ORDER — HYDROCHLOROTHIAZIDE 12.5 MG PO CAPS: 12.5000 mg | ORAL_CAPSULE | Freq: Every day | ORAL | 3 refills | Status: DC

## 2018-02-01 MED ORDER — LOSARTAN POTASSIUM 50 MG PO TABS: 50.0000 mg | ORAL_TABLET | Freq: Every day | ORAL | 3 refills | Status: DC

## 2018-02-01 NOTE — Telephone Encounter (Signed)
Pt advised we will send in the new RX for Losartan and HCTZ.[name]

## 2018-02-01 NOTE — Addendum Note (Signed)
Addended by: Stephani Police on: 02/01/2018 02:24 PM   Modules accepted: Orders

## 2018-02-01 NOTE — Telephone Encounter (Signed)
Yes ok to fill separate tablets losartan 50mg  daily and HCTZ 12.5mg  daily

## 2018-02-01 NOTE — Telephone Encounter (Signed)
New Message   Pt c/o medication issue:  1. Name of Medication: losartan-hydrochlorothiazide (HYZAAR) 50-12.5 MG tablet  2. How are you currently taking this medication (dosage and times per day)?   3. Are you having a reaction (difficulty breathing--STAT)?   4. What is your medication issue? Patient is calling because the pharmacy is unable to obtain the prescription is out of stock. He wants to know is there another medication that is recommended. Please call.

## 2018-02-02 ENCOUNTER — Telehealth: Payer: Self-pay | Admitting: *Deleted

## 2018-02-02 NOTE — Telephone Encounter (Signed)
   [facility] Pre-operative Risk Assessment    Request for surgical clearance:  1. What type of surgery is being performed?  RIGHT TOTAL KNEE   2. When is this surgery scheduled?  05/15/2018   3. What type of clearance is required (medical clearance vs. Pharmacy clearance to hold med vs. Both)?  MEDICAL  4. Are there any medications that need to be held prior to surgery and how long? NOT SPECIFIED   5. Practice name and name of physician performing surgery?  [facility]--DR. ALUISIO   6. What is your office phone number [phone]    7.   What is your office fax number [phone]  8.   Anesthesia type (None, local, MAC, general) ?  CHOICE   Wayne Roberson 02/02/2018, 9:48 AM  _________________________________________________________________   (provider comments below)

## 2018-02-02 NOTE — Telephone Encounter (Signed)
   Primary Cardiologist:Mark Marlou Porch, MD  Chart reviewed as part of pre-operative protocol coverage.   Patient was recently seen by Dr. Marlou Porch and is undergoing cardiac workup for shortness of breath and exertional chest pain with cardiac CT planned. F/u is not yet scheduled as it is contingent on those results. Surgery is not scheduled until 04/2018. Given Wayne Roberson past medical history and recent symptoms, I would recommend we arrange a follow-up visit in order to better assess preoperative cardiovascular risk. Please arrange tentatively for 4-6 weeks to f/u CT results with Dr. Marlou Porch' care team (after CT date) and discuss pre-op clearance.  Pre-op covering staff: - Please schedule appointment and call patient to inform them. - Please contact requesting surgeon's office via preferred method (i.e, phone, fax) to inform them of need for appointment prior to surgery.   Charlie Pitter, PA-C  02/02/2018, 3:58 PM

## 2018-02-03 NOTE — Telephone Encounter (Signed)
Left pt a detailed message re: surgical clearance and the request that pt has a 4/6 week f/u after CT to discuss results with Dr. Marlou Porch and address surgical clearance.

## 2018-02-06 NOTE — Telephone Encounter (Signed)
2nd attempt to reach pt re: surgical clearance. Left a message for pt to call back.

## 2018-02-07 NOTE — Telephone Encounter (Signed)
Follow up  ° ° °Patient is returning your call. °

## 2018-02-07 NOTE — Telephone Encounter (Signed)
Follow Up:      Returning your call from today. 

## 2018-02-07 NOTE — Telephone Encounter (Signed)
Pt has been scheduled with Dr. Marlou Porch 03/03/18. Mack Guise, [facility], is aware that if CT can't be done by that appt, that it will need to be r/s'd when they call to schedule CT.

## 2018-02-14 DIAGNOSIS — L570 Actinic keratosis: Secondary | ICD-10-CM | POA: Diagnosis not present

## 2018-02-14 DIAGNOSIS — L820 Inflamed seborrheic keratosis: Secondary | ICD-10-CM | POA: Diagnosis not present

## 2018-02-14 DIAGNOSIS — L812 Freckles: Secondary | ICD-10-CM | POA: Diagnosis not present

## 2018-02-14 DIAGNOSIS — L821 Other seborrheic keratosis: Secondary | ICD-10-CM | POA: Diagnosis not present

## 2018-02-14 DIAGNOSIS — D1801 Hemangioma of skin and subcutaneous tissue: Secondary | ICD-10-CM | POA: Diagnosis not present

## 2018-02-24 ENCOUNTER — Ambulatory Visit (HOSPITAL_COMMUNITY): Admission: RE | Admit: 2018-02-24 | Payer: Medicare Other | Source: Ambulatory Visit

## 2018-02-24 ENCOUNTER — Ambulatory Visit (HOSPITAL_COMMUNITY)
Admission: RE | Admit: 2018-02-24 | Discharge: 2018-02-24 | Disposition: A | Discharge status: Home / Self Care | Payer: Medicare Other | Source: Ambulatory Visit | Attending: Cardiology | Admitting: Cardiology

## 2018-02-24 DIAGNOSIS — I209 Angina pectoris, unspecified: Secondary | ICD-10-CM | POA: Diagnosis not present

## 2018-02-24 DIAGNOSIS — R0602 Shortness of breath: Secondary | ICD-10-CM

## 2018-02-24 LAB — POCT I-STAT CREATININE: CREATININE: 0.8 mg/dL (ref 0.61–1.24)

## 2018-02-24 MED ORDER — IOPAMIDOL (ISOVUE-370) INJECTION 76%
100.0000 mL | Freq: Once | INTRAVENOUS | Status: AC | PRN
  Administered 2018-02-24: 100 mL via INTRAVENOUS

## 2018-02-24 MED ORDER — METOPROLOL TARTRATE 5 MG/5ML IV SOLN
INTRAVENOUS | Status: AC
  Administered 2018-02-24: 5 mg
  Filled 2018-02-24: qty 5

## 2018-02-24 MED ORDER — NITROGLYCERIN 0.4 MG SL SUBL
0.8000 mg | SUBLINGUAL_TABLET | Freq: Once | SUBLINGUAL | Status: AC
  Administered 2018-02-24: 0.8 mg via SUBLINGUAL
  Filled 2018-02-24: qty 25

## 2018-02-24 MED ORDER — NITROGLYCERIN 0.4 MG SL SUBL
SUBLINGUAL_TABLET | SUBLINGUAL | Status: AC
  Filled 2018-02-24: qty 2

## 2018-02-24 MED ORDER — METOPROLOL TARTRATE 5 MG/5ML IV SOLN
5.0000 mg | INTRAVENOUS | Status: DC | PRN
  Filled 2018-02-24: qty 5

## 2018-03-02 DIAGNOSIS — C61 Malignant neoplasm of prostate: Secondary | ICD-10-CM | POA: Diagnosis not present

## 2018-03-03 ENCOUNTER — Telehealth: Payer: Self-pay | Admitting: *Deleted

## 2018-03-03 ENCOUNTER — Ambulatory Visit (INDEPENDENT_AMBULATORY_CARE_PROVIDER_SITE_OTHER): Payer: Medicare Other | Admitting: Cardiology

## 2018-03-03 ENCOUNTER — Encounter: Payer: Self-pay | Admitting: Cardiology

## 2018-03-03 VITALS — BP 136/76 | HR 74 | Ht 71.0 in | Wt 191.0 lb

## 2018-03-03 DIAGNOSIS — Z01810 Encounter for preprocedural cardiovascular examination: Secondary | ICD-10-CM

## 2018-03-03 DIAGNOSIS — J439 Emphysema, unspecified: Secondary | ICD-10-CM | POA: Diagnosis not present

## 2018-03-03 DIAGNOSIS — I10 Essential (primary) hypertension: Secondary | ICD-10-CM | POA: Diagnosis not present

## 2018-03-03 DIAGNOSIS — I209 Angina pectoris, unspecified: Secondary | ICD-10-CM | POA: Diagnosis not present

## 2018-03-03 DIAGNOSIS — R0602 Shortness of breath: Secondary | ICD-10-CM | POA: Diagnosis not present

## 2018-03-03 DIAGNOSIS — J432 Centrilobular emphysema: Secondary | ICD-10-CM | POA: Diagnosis not present

## 2018-03-03 NOTE — Telephone Encounter (Signed)
   [facility] Pre-operative Risk Assessment    Request for surgical clearance:  What type of surgery is being performed? Right Total Knee 1. When is this surgery scheduled? 05/15/2018  2. What type of clearance is required (medical clearance vs. Pharmacy clearance to hold med vs. Both)? Medical  3. Are there any medications that need to be held prior to surgery and how long?not specified  4. Practice name and name of physician performing surgery? Dr Wynelle Link - [facility]  5. What is your office phone number [phone]   7.   What is your office fax number [phone]  8.   Anesthesia type (None, local, MAC, general) ? Choice   Olin Hauser, RN for Dr [name] 03/03/2018, 10:00 AM  _________________________________________________________________   (provider comments below)  Preoperative cardiac for stratification prior to knee replacement - He may proceed with knee replacement with low overall cardiac risk.  Thankfully, cardiac CT was unremarkable.  Calcium score 0, no coronary artery disease.  Per Dr [name]

## 2018-03-03 NOTE — Progress Notes (Signed)
Cardiology Office Note:    Date:  03/03/2018   ID:  Wayne Roberson, DOB [birth year], MRN [id number]  PCP:  [name], MD  Cardiologist:  [name], MD  Electrophysiologist:  None   Referring MD: [name], MD     History of Present Illness:    Wayne Roberson is a 72 y.o. male here for preoperative or stratification prior to knee replacement.  Recent CT scan of coronary arteries performed secondary to chest pain and shortness of breath as well as fatigue.  His CT of coronaries on 02/24/2018 was negative.  Calcium score 0, no coronary artery disease.  Echocardiogram 08/10/2016:  - Left ventricle: The cavity size was normal. Wall thickness was   normal. Systolic function was normal. The estimated ejection   fraction was in the range of 55% to 60%. Wall motion was normal;   there were no regional wall motion abnormalities. Doppler   parameters are consistent with abnormal left ventricular   relaxation (grade 1 diastolic dysfunction).  Impressions:  - Normal LV systolic function; mild diastolic dysfunction.  CT scan did show mild centrilobular emphysema.  Likely reason for shortness of breath.  Prior to this work-up on 08/10/2016-no ischemia identified on treadmill test.  Past Medical History:  Diagnosis Date  . Arthritis    knees  . Prostate cancer ([place])   . Trace cataracts     Past Surgical History:  Procedure Laterality Date  . PROSTATE BIOPSY    . RADIOACTIVE SEED IMPLANT N/A 03/01/2014   Procedure: RADIOACTIVE SEED IMPLANT;  Surgeon: [name], MD;  Location: [facility];  Service: Urology;  Laterality: N/A;  . VENTRAL HERNIA REPAIR      Current Medications: Current Meds  Medication Sig  . aspirin EC 81 MG tablet Take 81 mg by mouth daily.  . fluocinonide (LIDEX) 0.05 % external solution Apply 1 application topically 2 (two) times daily.  . Glucosamine-Chondroitin-MSM (TRIPLE FLEX PO) Take 1 capsule by mouth 2 (two) times daily.     . hydrochlorothiazide (MICROZIDE) 12.5 MG capsule Take 1 capsule (12.5 mg total) by mouth daily.  [name] LORazepam (ATIVAN) 0.5 MG tablet Take 0.5 mg by mouth daily as needed for sleep.   [name] losartan (COZAAR) 50 MG tablet Take 1 tablet (50 mg total) by mouth daily.  . Multiple Vitamins-Minerals (MULTIVITAMIN WITH MINERALS) tablet Take 1 tablet by mouth daily.  . naproxen sodium (ANAPROX) 220 MG tablet Take 440 mg by mouth 2 (two) times daily as needed (pain).  . vitamin C (ASCORBIC ACID) 500 MG tablet Take 500 mg by mouth daily.     Allergies:   Penicillins   Social History   Socioeconomic History  . Marital status: Married    Spouse name: Not on file  . Number of children: Not on file  . Years of education: Not on file  . Highest education level: Not on file  Occupational History  . Not on file  Social Needs  . Financial resource strain: Not on file  . Food insecurity:    Worry: Not on file    Inability: Not on file  . Transportation needs:    Medical: Not on file    Non-medical: Not on file  Tobacco Use  . Smoking status: Former Smoker    Packs/day: 2.00    Years: 42.00    Pack years: 84.00    Types: Cigarettes    Last attempt to quit: 03/29/2004    Years since quitting: 13.9  .  Smokeless tobacco: Never Used  Substance and Sexual Activity  . Alcohol use: Yes    Alcohol/week: 12.0 standard drinks    Types: 10 Glasses of wine, 2 Standard drinks or equivalent per week  . Drug use: No  . Sexual activity: Never  Lifestyle  . Physical activity:    Days per week: Not on file    Minutes per session: Not on file  . Stress: Not on file  Relationships  . Social connections:    Talks on phone: Not on file    Gets together: Not on file    Attends religious service: Not on file    Active member of club or organization: Not on file    Attends meetings of clubs or organizations: Not on file    Relationship status: Not on file  Other Topics Concern  . Not on file  Social History  Narrative  . Not on file     Family History: The patient's family history includes Heart failure in his father and mother. There is no history of Cancer.  ROS:   Please see the history of present illness.    Denies any fevers chills nausea vomiting syncope bleeding.  All other systems reviewed and are negative.  EKGs/Labs/Other Studies Reviewed:    The following studies were reviewed today: CT scan of coronary arteries, EKG, prior office note, lab work  EKG: 03/03/2018-sinus rhythm 74 with no other significant changes.  Suddenly reviewed and interpreted-01/31/2018 shows normal sinus rhythm with no other significant abnormalities.  Personally reviewed and interpreted.   Recent Labs: 02/24/2018: Creatinine, Ser 0.80  Recent Lipid Panel No results found for: CHOL, TRIG, HDL, CHOLHDL, VLDL, LDLCALC, LDLDIRECT  Physical Exam:    VS:  BP 136/76   Pulse 74   Ht 5\' 11"  (1.803 m)   Wt 191 lb (86.6 kg)   BMI 26.64 kg/m     Wt Readings from Last 3 Encounters:  03/03/18 191 lb (86.6 kg)  01/31/18 192 lb 3.2 oz (87.2 kg)  07/28/16 197 lb (89.4 kg)     GEN:  Well nourished, well developed in no acute distress HEENT: Normal NECK: No JVD; No carotid bruits LYMPHATICS: No lymphadenopathy CARDIAC: RRR, no murmurs, rubs, gallops RESPIRATORY:  Clear to auscultation without rales, wheezing or rhonchi  ABDOMEN: Soft, non-tender, non-distended MUSCULOSKELETAL:  No edema; No deformity  SKIN: Warm and dry NEUROLOGIC:  Alert and oriented x 3 PSYCHIATRIC:  Normal affect   ASSESSMENT:    1. Pre-operative cardiovascular examination   2. Shortness of breath   3. Essential hypertension   4. Centrilobular emphysema ([place])   5. Pulmonary emphysema, unspecified emphysema type ([place])    PLAN:    In order of problems listed above:  Preoperative cardiac for stratification prior to knee replacement - He may proceed with knee replacement with low overall cardiac risk.  Thankfully, cardiac CT was  unremarkable.  Calcium score 0, no coronary artery disease.  This is amazing since he had smoked for so many years.  His mother and father both had obesity and diabetes. Normal ejection fraction.  Centrilobular emphysema - Encouraged him to follow-up with pulmonary.  Likely cause of shortness of breath.  Mild emphysema.  Should not be a hindrance to his surgery.  I will refer him to at the request of his wife, Dr. Lake Bells to evaluate his centrilobular emphysema and to provide further treatment strategies to help him.   Essential hypertension -Medications as above, currently well controlled.  Since we  can see him back on as-needed basis, I would like [name] to continue to prescribe these medications.  Thank you.  We will see him back on as-needed basis.  Medication Adjustments/Labs and Tests Ordered: Current medicines are reviewed at length with the patient today.  Concerns regarding medicines are outlined above.  Orders Placed This Encounter  Procedures  . Ambulatory referral to Pulmonology  . EKG 12-Lead   No orders of the defined types were placed in this encounter.   Patient Instructions  Medication Instructions:  The current medical regimen is effective;  continue present plan and medications.  You are being referred to [facility] (Dr Lake Bells) for the evaluation of emphysema.  Follow-Up: . Follow up as needed with Dr Marlou Porch.  Thank you for choosing [facility]!!     You have been cleared from a cardiac perspective for your upcoming knee surgery.    Signed, [name], MD  03/03/2018 9:22 AM    [facility]

## 2018-03-03 NOTE — Patient Instructions (Signed)
Medication Instructions:  The current medical regimen is effective;  continue present plan and medications.  You are being referred to [facility] (Dr Lake Bells) for the evaluation of emphysema.  Follow-Up: . Follow up as needed with Dr Marlou Porch.  Thank you for choosing [facility]!!     You have been cleared from a cardiac perspective for your upcoming knee surgery.

## 2018-03-09 DIAGNOSIS — C61 Malignant neoplasm of prostate: Secondary | ICD-10-CM | POA: Diagnosis not present

## 2018-03-09 DIAGNOSIS — R351 Nocturia: Secondary | ICD-10-CM | POA: Diagnosis not present

## 2018-03-15 DIAGNOSIS — R0609 Other forms of dyspnea: Secondary | ICD-10-CM | POA: Diagnosis not present

## 2018-03-15 DIAGNOSIS — Z0000 Encounter for general adult medical examination without abnormal findings: Secondary | ICD-10-CM | POA: Diagnosis not present

## 2018-03-15 DIAGNOSIS — Z1389 Encounter for screening for other disorder: Secondary | ICD-10-CM | POA: Diagnosis not present

## 2018-03-15 DIAGNOSIS — I10 Essential (primary) hypertension: Secondary | ICD-10-CM | POA: Diagnosis not present

## 2018-03-15 DIAGNOSIS — Z8546 Personal history of malignant neoplasm of prostate: Secondary | ICD-10-CM | POA: Diagnosis not present

## 2018-03-15 DIAGNOSIS — Z86010 Personal history of colonic polyps: Secondary | ICD-10-CM | POA: Diagnosis not present

## 2018-04-10 DIAGNOSIS — M25561 Pain in right knee: Secondary | ICD-10-CM | POA: Diagnosis not present

## 2018-04-14 DIAGNOSIS — H40013 Open angle with borderline findings, low risk, bilateral: Secondary | ICD-10-CM | POA: Diagnosis not present

## 2018-04-14 DIAGNOSIS — H04123 Dry eye syndrome of bilateral lacrimal glands: Secondary | ICD-10-CM | POA: Diagnosis not present

## 2018-04-21 ENCOUNTER — Encounter: Payer: Self-pay | Admitting: Pulmonary Disease

## 2018-04-21 ENCOUNTER — Ambulatory Visit (INDEPENDENT_AMBULATORY_CARE_PROVIDER_SITE_OTHER): Payer: Medicare Other | Admitting: Pulmonary Disease

## 2018-04-21 VITALS — BP 130/86 | HR 77 | Ht 71.0 in | Wt 194.6 lb

## 2018-04-21 DIAGNOSIS — J449 Chronic obstructive pulmonary disease, unspecified: Secondary | ICD-10-CM | POA: Diagnosis not present

## 2018-04-21 MED ORDER — ALBUTEROL SULFATE HFA 108 (90 BASE) MCG/ACT IN AERS: 2.0000 | INHALATION_SPRAY | Freq: Four times a day (QID) | RESPIRATORY_TRACT | 6 refills | Status: DC | PRN

## 2018-04-21 NOTE — Patient Instructions (Signed)
COPD: You have very mild disease Use albuterol as needed for chest tightness wheezing or shortness of breath The most important thing you can do to protect yourself is to prevent infection: You have already done this by getting the pneumonia vaccine and flu shots.  You should also practice good hand hygiene You should remain physically active.  You should make it a goal to be able to walk 20 minutes a day at a pace that you cannot complete a sentence because of breathlessness.  You should also do 2 days of strength training a week. A good goal for protein intake is 1.2 to 1.6 g/kg of body weight per day.  From my standpoint it safe for you to proceed with knee replacement  If you have increasing cough, mucus production, or shortness of breath please let us know as this would be considered a flare of COPD and will need to be treated.  We will see you back on an as-needed basis.

## 2018-04-21 NOTE — Progress Notes (Signed)
Synopsis: Referred in January 2020 for Dypsnea, noted to have centrilobular emphysema on a calcium scoring CT scan.  Subjective:   PATIENT ID: Wayne Roberson GENDER: male DOB: 1945/12/28, MRN: [id number]   HPI  Chief Complaint  Patient presents with  . Consult    referred by Dr. Marlou Porch for emphysema, based on 11/29 coronary CT.      Dyspnea> lead to a CT chest > associated with chest pain > he notices it when he plays golf and when he climbs a hill > sometimes if he climbs two flights of stairs he gets dyspnea (like climbing into his attack) > sometimes he has dyspnea while doing yard works > going on 2-3 years > smoked for 42 years, quit in 2006. Smoked 2 ppd > never told he had asthma or other lung problems.  > he has never been diagnosed with pneumonia > he has been coughing up mucus in the mornings, he wonders if his sinuses cause it because he has a lot of sinus congestion > he says the mucus production has been going on for 2-3 months now > it is typically white mucus production, sometimes yellow > this has only happened here recently.  Past Medical History:  Diagnosis Date  . Arthritis    knees  . Prostate cancer ([place])   . Trace cataracts      Family History  Problem Relation Age of Onset  . Heart failure Mother   . Heart failure Father   . Cancer Neg Hx      Social History   Socioeconomic History  . Marital status: Married    Spouse name: Not on file  . Number of children: Not on file  . Years of education: Not on file  . Highest education level: Not on file  Occupational History  . Not on file  Social Needs  . Financial resource strain: Not on file  . Food insecurity:    Worry: Not on file    Inability: Not on file  . Transportation needs:    Medical: Not on file    Non-medical: Not on file  Tobacco Use  . Smoking status: Former Smoker    Packs/day: 2.00    Years: 42.00    Pack years: 84.00    Types: Cigarettes    Last attempt to quit:  03/29/2004    Years since quitting: 14.0  . Smokeless tobacco: Never Used  Substance and Sexual Activity  . Alcohol use: Yes    Alcohol/week: 12.0 standard drinks    Types: 10 Glasses of wine, 2 Standard drinks or equivalent per week  . Drug use: No  . Sexual activity: Never  Lifestyle  . Physical activity:    Days per week: Not on file    Minutes per session: Not on file  . Stress: Not on file  Relationships  . Social connections:    Talks on phone: Not on file    Gets together: Not on file    Attends religious service: Not on file    Active member of club or organization: Not on file    Attends meetings of clubs or organizations: Not on file    Relationship status: Not on file  . Intimate partner violence:    Fear of current or ex partner: Not on file    Emotionally abused: Not on file    Physically abused: Not on file    Forced sexual activity: Not on file  Other Topics Concern  .  Not on file  Social History Narrative  . Not on file     Allergies  Allergen Reactions  . Penicillins     Childhood allergy.     Outpatient Medications Prior to Visit  Medication Sig Dispense Refill  . aspirin EC 81 MG tablet Take 81 mg by mouth daily.    . fluocinonide (LIDEX) 0.05 % external solution Apply 1 application topically 2 (two) times daily.    . Glucosamine-Chondroitin-MSM (TRIPLE FLEX PO) Take 1 capsule by mouth 2 (two) times daily.     . hydrochlorothiazide (MICROZIDE) 12.5 MG capsule Take 1 capsule (12.5 mg total) by mouth daily. 90 capsule 3  . LORazepam (ATIVAN) 0.5 MG tablet Take 0.5 mg by mouth daily as needed for sleep.     [name] losartan (COZAAR) 50 MG tablet Take 1 tablet (50 mg total) by mouth daily. 90 tablet 3  . Multiple Vitamins-Minerals (MULTIVITAMIN WITH MINERALS) tablet Take 1 tablet by mouth daily.    . naproxen sodium (ANAPROX) 220 MG tablet Take 440 mg by mouth 2 (two) times daily as needed (pain).    . vitamin C (ASCORBIC ACID) 500 MG tablet Take 500 mg by  mouth daily.     No facility-administered medications prior to visit.     Review of Systems  Constitutional: Negative for fever and weight loss.  HENT: Negative for congestion, ear pain, nosebleeds and sore throat.   Eyes: Negative for redness.  Respiratory: Positive for shortness of breath. Negative for cough and wheezing.   Cardiovascular: Negative for palpitations, leg swelling and PND.  Gastrointestinal: Negative for nausea and vomiting.  Genitourinary: Negative for dysuria.  Skin: Negative for rash.  Neurological: Negative for headaches.  Endo/Heme/Allergies: Does not bruise/bleed easily.  Psychiatric/Behavioral: Negative for depression. The patient is not nervous/anxious.       Objective:  Physical Exam   Vitals:   04/21/18 1005  BP: 130/86  Pulse: 77  SpO2: 98%  Weight: 194 lb 9.6 oz (88.3 kg)  Height: 5\' 11"  (1.803 m)   RA  Gen: well appearing, no acute distress HENT: NCAT, OP clear, neck supple without masses Eyes: PERRL, EOMi Lymph: no cervical lymphadenopathy PULM: CTA B CV: RRR, no mgr, no JVD GI: BS+, soft, nontender, no hsm Derm: no rash or skin breakdown MSK: normal bulk and tone Neuro: A&Ox4, CN II-XII intact, strength 5/5 in all 4 extremities Psyche: normal mood and affect   CBC    Component Value Date/Time   WBC 6.3 02/25/2014 0829   RBC 4.91 02/25/2014 0829   HGB 14.3 02/25/2014 0829   HCT 43.2 02/25/2014 0829   PLT 185 02/25/2014 0829   MCV 88.0 02/25/2014 0829   MCH 29.1 02/25/2014 0829   MCHC 33.1 02/25/2014 0829   RDW 13.9 02/25/2014 0829     Chest imaging: 01/2018 CT chest calcium score 0, centrilobular emphysema which is mild in appearance, images independently reviewed  PFT: 2017 PFT ratio 72%, FVC 5.08 L 116% predicted, total lung capacity 7.51 106% predicted, DLCO 26.10 80% predicted  Labs:  Path:  Echo: 2018 TTE> Normal LVEF, no LVH  Heart Catheterization:   Records from his visit with cardiology reviewed  where he was seen in perioperative consultation for an upcoming knee replacement.  At that time he was found to have a calcium score of 0, recent normal echocardiogram and it is felt that he could proceed with knee replacement.    Assessment & Plan:   COPD (chronic obstructive pulmonary disease) with  chronic bronchitis ([place])  Discussion: This is a pleasant 73 year old male who has mild centrilobular emphysema seen on a CT scan of his chest and some symptoms which are suggestive of mild COPD (recent daily mucus production.  However, he remains quite active and is not physically limited by his shortness of breath.  I explained to him that the knowledge of having early stage COPD is helpful because it should encourage him to keep appointments for flu shots every year and practice good hand hygiene.  We also talked a lot about the importance of ongoing exercise to stay physically fit as this is probably the best modifiable factor for him.  He has very mild centrilobular emphysema and no airflow obstruction seen on the lung function test from over 2 years ago so at this time I do not feel that COPD will have a big impact on his life.  Plan: COPD: You have very mild disease (GOLD GRADE A) Use albuterol as needed for chest tightness wheezing or shortness of breath The most important thing you can do to protect yourself is to prevent infection: You have already done this by getting the pneumonia vaccine and flu shots.  You should also practice good hand hygiene You should remain physically active.  You should make it a goal to be able to walk 20 minutes a day at a pace that you cannot complete a sentence because of breathlessness.  You should also do 2 days of strength training a week. A good goal for protein intake is 1.2 to 1.6 g/kg of body weight per day.  From my standpoint it safe for you to proceed with knee replacement  If you have increasing cough, mucus production, or shortness of breath please  let us know as this would be considered a flare of COPD and will need to be treated.  We will see you back on an as-needed basis.    Current Outpatient Medications:  .  aspirin EC 81 MG tablet, Take 81 mg by mouth daily., Disp: , Rfl:  .  fluocinonide (LIDEX) 0.05 % external solution, Apply 1 application topically 2 (two) times daily., Disp: , Rfl:  .  Glucosamine-Chondroitin-MSM (TRIPLE FLEX PO), Take 1 capsule by mouth 2 (two) times daily. , Disp: , Rfl:  .  hydrochlorothiazide (MICROZIDE) 12.5 MG capsule, Take 1 capsule (12.5 mg total) by mouth daily., Disp: 90 capsule, Rfl: 3 .  LORazepam (ATIVAN) 0.5 MG tablet, Take 0.5 mg by mouth daily as needed for sleep. , Disp: , Rfl:  .  losartan (COZAAR) 50 MG tablet, Take 1 tablet (50 mg total) by mouth daily., Disp: 90 tablet, Rfl: 3 .  Multiple Vitamins-Minerals (MULTIVITAMIN WITH MINERALS) tablet, Take 1 tablet by mouth daily., Disp: , Rfl:  .  naproxen sodium (ANAPROX) 220 MG tablet, Take 440 mg by mouth 2 (two) times daily as needed (pain)., Disp: , Rfl:  .  vitamin C (ASCORBIC ACID) 500 MG tablet, Take 500 mg by mouth daily., Disp: , Rfl:  .  albuterol (PROVENTIL HFA;VENTOLIN HFA) 108 (90 Base) MCG/ACT inhaler, Inhale 2 puffs into the lungs every 6 (six) hours as needed for wheezing or shortness of breath., Disp: 1 Inhaler, Rfl: 6

## 2018-05-03 NOTE — H&P (Signed)
TOTAL KNEE ADMISSION H&P  Patient is being admitted for right total knee arthroplasty.  Subjective:  Chief Complaint:right knee pain.  HPI: Wayne Roberson, 73 y.o. male, has a history of pain and functional disability in the right knee due to arthritis and has failed non-surgical conservative treatments for greater than 12 weeks to includecorticosteriod injections, viscosupplementation injections and activity modification.  Onset of symptoms was gradual, starting >10 years ago with gradually worsening course since that time. The patient noted no past surgery on the right knee(s).  Patient currently rates pain in the right knee(s) at 8 out of 10 with activity. Patient has worsening of pain with activity and weight bearing, pain that interferes with activities of daily living and crepitus.  Patient has evidence of significant medial and lateral compartment narrowing that is not fully bone-on-bone. He additionally has patellofemoral degenerative changes by imaging studies. There is no active infection.  Patient Active Problem List   Diagnosis Date Noted  . Pain in right knee 01/24/2018  . Prostate cancer ([place]) 01/06/2014  . DJD (degenerative joint disease) of knee 07/03/2012  . Hamstring muscle strain 05/22/2012  . Hip pain, right 05/22/2012   Past Medical History:  Diagnosis Date  . Arthritis    knees  . Prostate cancer ([place])   . Trace cataracts     Past Surgical History:  Procedure Laterality Date  . PROSTATE BIOPSY    . RADIOACTIVE SEED IMPLANT N/A 03/01/2014   Procedure: RADIOACTIVE SEED IMPLANT;  Surgeon: [name], MD;  Location: [facility];  Service: Urology;  Laterality: N/A;  . VENTRAL HERNIA REPAIR      No current facility-administered medications for this encounter.    Current Outpatient Medications  Medication Sig Dispense Refill Last Dose  . albuterol (PROVENTIL HFA;VENTOLIN HFA) 108 (90 Base) MCG/ACT inhaler Inhale 2 puffs into the lungs every 6 (six)  hours as needed for wheezing or shortness of breath. 1 Inhaler 6   . Ascorbic Acid (VITAMIN C) 1000 MG tablet Take 1,000 mg by mouth daily.     [name] aspirin EC 81 MG tablet Take 81 mg by mouth every evening.    Taking  . fluocinonide (LIDEX) 0.05 % external solution Apply 1 application topically daily as needed (rash).    Taking  . fluticasone (FLONASE) 50 MCG/ACT nasal spray Place 2 sprays into both nostrils daily as needed for allergies or rhinitis.     . Glucosamine-Chondroitin-MSM (TRIPLE FLEX PO) Take 1 capsule by mouth 2 (two) times daily.    Taking  . hydrochlorothiazide (MICROZIDE) 12.5 MG capsule Take 1 capsule (12.5 mg total) by mouth daily. 90 capsule 3 Taking  . LORazepam (ATIVAN) 0.5 MG tablet Take 0.5 mg by mouth daily as needed for sleep.    Taking  . losartan (COZAAR) 50 MG tablet Take 1 tablet (50 mg total) by mouth daily. 90 tablet 3 Taking  . Multiple Vitamins-Minerals (MULTIVITAMIN WITH MINERALS) tablet Take 1 tablet by mouth every evening.    Taking  . naproxen sodium (ANAPROX) 220 MG tablet Take 440 mg by mouth 2 (two) times daily as needed (pain).   Taking   Allergies  Allergen Reactions  . Penicillins     Did it involve swelling of the face/tongue/throat, SOB, or low BP? Unknown Did it involve sudden or severe rash/hives, skin peeling, or any reaction on the inside of your mouth or nose? Unknown Did you need to seek medical attention at a hospital or doctor's office? No When  did it last happen?Childhood allergy If all above answers are "NO", may proceed with cephalosporin use.     Social History   Tobacco Use  . Smoking status: Former Smoker    Packs/day: 2.00    Years: 42.00    Pack years: 84.00    Types: Cigarettes    Last attempt to quit: 03/29/2004    Years since quitting: 14.1  . Smokeless tobacco: Never Used  Substance Use Topics  . Alcohol use: Yes    Alcohol/week: 12.0 standard drinks    Types: 10 Glasses of wine, 2 Standard drinks or equivalent  per week    Family History  Problem Relation Age of Onset  . Heart failure Mother   . Heart failure Father   . Cancer Neg Hx      Review of Systems  Constitutional: Negative for chills and fever.  HENT: Negative for congestion, sore throat and tinnitus.   Eyes: Negative for double vision, photophobia and pain.  Respiratory: Negative for cough, shortness of breath and wheezing.   Cardiovascular: Negative for chest pain, palpitations and orthopnea.  Gastrointestinal: Negative for constipation, heartburn, nausea and vomiting.  Genitourinary: Negative for dysuria, frequency and urgency.  Musculoskeletal: Positive for joint pain.  Neurological: Negative for dizziness, weakness and headaches.    Objective:  Physical Exam  Well nourished and well developed. General: Alert and oriented x3, cooperative and pleasant, no acute distress. Head: normocephalic, atraumatic, neck supple. Eyes: EOMI. Respiratory: breath sounds clear in all fields, no wheezing, rales, or rhonchi. Cardiovascular: Regular rate and rhythm, no murmurs, gallops or rubs.  Abdomen: non-tender to palpation and soft, normoactive bowel sounds.  Musculoskeletal: Right Knee Exam:  No effusion. No Swelling. Range of motion is 0-125 degrees.  No crepitus on range of motion of the knee.  Moderate medial joint line tenderness Significant lateral joint line tenderness.  Stable knee.  Calves soft and nontender. Motor function intact in LE. Strength 5/5 LE bilaterally. Neuro: Distal pulses 2+. Sensation to light touch intact in LE.  Vital signs in last 24 hours: Blood pressure: 131/90 mmHg Pulse: 72 bpm  Labs:   Estimated body mass index is 27.14 kg/m as calculated from the following:   Height as of 04/21/18: 5\' 11"  (1.803 m).   Weight as of 04/21/18: 88.3 kg.   Imaging Review Plain radiographs demonstrate severe degenerative joint disease of the right knee(s). The overall alignment isneutral. The bone quality  appears to be adequate for age and reported activity level.   Preoperative templating of the joint replacement has been completed, documented, and submitted to the Operating Room personnel in order to optimize intra-operative equipment management.   Anticipated LOS equal to or greater than 2 midnights due to - Age 45 and older with one or more of the following:  - Obesity  - Expected need for hospital services (PT, OT, Nursing) required for safe  discharge  - Anticipated need for postoperative skilled nursing care or inpatient rehab  - Active co-morbidities: None OR   - Unanticipated findings during/Post Surgery: None  - Patient is a high risk of re-admission due to: None     Assessment/Plan:  End stage arthritis, right knee   The patient history, physical examination, clinical judgment of the provider and imaging studies are consistent with end stage degenerative joint disease of the right knee(s) and total knee arthroplasty is deemed medically necessary. The treatment options including medical management, injection therapy arthroscopy and arthroplasty were discussed at length. The risks and benefits  of total knee arthroplasty were presented and reviewed. The risks due to aseptic loosening, infection, stiffness, patella tracking problems, thromboembolic complications and other imponderables were discussed. The patient acknowledged the explanation, agreed to proceed with the plan and consent was signed. Patient is being admitted for inpatient treatment for surgery, pain control, PT, OT, prophylactic antibiotics, VTE prophylaxis, progressive ambulation and ADL's and discharge planning. The patient is planning to be discharged home.  Therapy Plans: VERA Disposition: Home with wife Planned DVT Prophylaxis: Xarelto 10mg  daily DME needed: walker  PCP: Dr. [name] (Cardiologist) TXA: IV Allergies: PCN - childhood allergy causing dizziness Anesthesia Concerns: none BMI: 27.3  -  Patient was instructed on what medications to stop prior to surgery. - Follow-up visit in 2 weeks with Dr. Wynelle Link - Begin physical therapy following surgery - Pre-operative lab work as pre-surgical testing - Prescriptions will be provided in hospital at time of discharge  Theresa Duty, PA-C Orthopedic Surgery [facility] [place]

## 2018-05-11 NOTE — Patient Instructions (Signed)
Wayne Roberson  05/11/2018   Your procedure is scheduled on: 05-15-2018    Report to [facility] Main  Entrance     Report to [place] at 5:30AM    Call this number if you have problems the morning of surgery [phone]      Remember: Do not eat food or drink liquids :After Midnight. BRUSH YOUR TEETH MORNING OF SURGERY AND RINSE YOUR MOUTH OUT, NO CHEWING GUM CANDY OR MINTS.     Take these medicines the morning of surgery with A SIP OF WATER: ALBUTEROL INHALER IF NEEDED                                You may not have any metal on your body including hair pins and              piercings  Do not wear jewelry, make-up, lotions, powders or perfumes, deodorant              Men may shave face and neck.   Do not bring valuables to the hospital. [place].  Contacts, dentures or bridgework may not be worn into surgery.  Leave suitcase in the car. After surgery it may be brought to your room.                  Please read over the following fact sheets you were given: _____________________________________________________________________             [facility] - Preparing for Surgery Before surgery, you can play an important role.  Because skin is not sterile, your skin needs to be as free of germs as possible.  You can reduce the number of germs on your skin by washing with CHG (chlorahexidine gluconate) soap before surgery.  CHG is an antiseptic cleaner which kills germs and bonds with the skin to continue killing germs even after washing. Please DO NOT use if you have an allergy to CHG or antibacterial soaps.  If your skin becomes reddened/irritated stop using the CHG and inform your nurse when you arrive at Short Stay. Do not shave (including legs and underarms) for at least 48 hours prior to the first CHG shower.  You may shave your face/neck. Please follow these instructions carefully:  1.  Shower  with CHG Soap the night before surgery and the  morning of Surgery.  2.  If you choose to wash your hair, wash your hair first as usual with your  normal  shampoo.  3.  After you shampoo, rinse your hair and body thoroughly to remove the  shampoo.                           4.  Use CHG as you would any other liquid soap.  You can apply chg directly  to the skin and wash                       Gently with a scrungie or clean washcloth.  5.  Apply the CHG Soap to your body ONLY FROM THE NECK DOWN.   Do not use on face/ open  Wound or open sores. Avoid contact with eyes, ears mouth and genitals (private parts).                       Wash face,  Genitals (private parts) with your normal soap.             6.  Wash thoroughly, paying special attention to the area where your surgery  will be performed.  7.  Thoroughly rinse your body with warm water from the neck down.  8.  DO NOT shower/wash with your normal soap after using and rinsing off  the CHG Soap.                9.  Pat yourself dry with a clean towel.            10.  Wear clean pajamas.            11.  Place clean sheets on your bed the night of your first shower and do not  sleep with pets. Day of Surgery : Do not apply any lotions/deodorants the morning of surgery.  Please wear clean clothes to the hospital/surgery center.  FAILURE TO FOLLOW THESE INSTRUCTIONS MAY RESULT IN THE CANCELLATION OF YOUR SURGERY PATIENT SIGNATURE_________________________________  NURSE SIGNATURE__________________________________  ________________________________________________________________________   Wayne Roberson  An incentive spirometer is a tool that can help keep your lungs clear and active. This tool measures how well you are filling your lungs with each breath. Taking long deep breaths may help reverse or decrease the chance of developing breathing (pulmonary) problems (especially infection) following:  A long period  of time when you are unable to move or be active. BEFORE THE PROCEDURE   If the spirometer includes an indicator to show your best effort, your nurse or respiratory therapist will set it to a desired goal.  If possible, sit up straight or lean slightly forward. Try not to slouch.  Hold the incentive spirometer in an upright position. INSTRUCTIONS FOR USE  1. Sit on the edge of your bed if possible, or sit up as far as you can in bed or on a chair. 2. Hold the incentive spirometer in an upright position. 3. Breathe out normally. 4. Place the mouthpiece in your mouth and seal your lips tightly around it. 5. Breathe in slowly and as deeply as possible, raising the piston or the ball toward the top of the column. 6. Hold your breath for 3-5 seconds or for as long as possible. Allow the piston or ball to fall to the bottom of the column. 7. Remove the mouthpiece from your mouth and breathe out normally. 8. Rest for a few seconds and repeat Steps 1 through 7 at least 10 times every 1-2 hours when you are awake. Take your time and take a few normal breaths between deep breaths. 9. The spirometer may include an indicator to show your best effort. Use the indicator as a goal to work toward during each repetition. 10. After each set of 10 deep breaths, practice coughing to be sure your lungs are clear. If you have an incision (the cut made at the time of surgery), support your incision when coughing by placing a pillow or rolled up towels firmly against it. Once you are able to get out of bed, walk around indoors and cough well. You may stop using the incentive spirometer when instructed by your caregiver.  RISKS AND COMPLICATIONS  Take your time so you do not get  dizzy or light-headed.  If you are in pain, you may need to take or ask for pain medication before doing incentive spirometry. It is harder to take a deep breath if you are having pain. AFTER USE  Rest and breathe slowly and easily.  It  can be helpful to keep track of a log of your progress. Your caregiver can provide you with a simple table to help with this. If you are using the spirometer at home, follow these instructions: [place] IF:   You are having difficultly using the spirometer.  You have trouble using the spirometer as often as instructed.  Your pain medication is not giving enough relief while using the spirometer.  You develop fever of 100.5 F (38.1 C) or higher. SEEK IMMEDIATE MEDICAL CARE IF:   You cough up bloody sputum that had not been present before.  You develop fever of 102 F (38.9 C) or greater.  You develop worsening pain at or near the incision site. MAKE SURE YOU:   Understand these instructions.  Will watch your condition.  Will get help right away if you are not doing well or get worse. Document Released: 07/26/2006 Document Revised: 06/07/2011 Document Reviewed: 09/26/2006 Exit[employer] Patient Information 2014 Exit[employer], [place].   ________________________________________________________________________  WHAT IS A BLOOD TRANSFUSION? Blood Transfusion Information  A transfusion is the replacement of blood or some of its parts. Blood is made up of multiple cells which provide different functions.  Red blood cells carry oxygen and are used for blood loss replacement.  White blood cells fight against infection.  Platelets control bleeding.  Plasma helps clot blood.  Other blood products are available for specialized needs, such as hemophilia or other clotting disorders. BEFORE THE TRANSFUSION  Who gives blood for transfusions?   Healthy volunteers who are fully evaluated to make sure their blood is safe. This is blood bank blood. Transfusion therapy is the safest it has ever been in the practice of medicine. Before blood is taken from a donor, a complete history is taken to make sure that person has no history of diseases nor engages in risky social behavior (examples  are intravenous drug use or sexual activity with multiple partners). The donor's travel history is screened to minimize risk of transmitting infections, such as malaria. The donated blood is tested for signs of infectious diseases, such as HIV and hepatitis. The blood is then tested to be sure it is compatible with you in order to minimize the chance of a transfusion reaction. If you or a relative donates blood, this is often done in anticipation of surgery and is not appropriate for emergency situations. It takes many days to process the donated blood. RISKS AND COMPLICATIONS Although transfusion therapy is very safe and saves many lives, the main dangers of transfusion include:   Getting an infectious disease.  Developing a transfusion reaction. This is an allergic reaction to something in the blood you were given. Every precaution is taken to prevent this. The decision to have a blood transfusion has been considered carefully by your caregiver before blood is given. Blood is not given unless the benefits outweigh the risks. AFTER THE TRANSFUSION  Right after receiving a blood transfusion, you will usually feel much better and more energetic. This is especially true if your red blood cells have gotten low (anemic). The transfusion raises the level of the red blood cells which carry oxygen, and this usually causes an energy increase.  The nurse administering the transfusion will  monitor you carefully for complications. HOME CARE INSTRUCTIONS  No special instructions are needed after a transfusion. You may find your energy is better. Speak with your caregiver about any limitations on activity for underlying diseases you may have. SEEK MEDICAL CARE IF:   Your condition is not improving after your transfusion.  You develop redness or irritation at the intravenous (IV) site. SEEK IMMEDIATE MEDICAL CARE IF:  Any of the following symptoms occur over the next 12 hours:  Shaking chills.  You have a  temperature by mouth above 102 F (38.9 C), not controlled by medicine.  Chest, back, or muscle pain.  People around you feel you are not acting correctly or are confused.  Shortness of breath or difficulty breathing.  Dizziness and fainting.  You get a rash or develop hives.  You have a decrease in urine output.  Your urine turns a dark color or changes to pink, red, or brown. Any of the following symptoms occur over the next 10 days:  You have a temperature by mouth above 102 F (38.9 C), not controlled by medicine.  Shortness of breath.  Weakness after normal activity.  The white part of the eye turns yellow (jaundice).  You have a decrease in the amount of urine or are urinating less often.  Your urine turns a dark color or changes to pink, red, or brown. Document Released: 03/12/2000 Document Revised: 06/07/2011 Document Reviewed: 10/30/2007 [facility] Patient Information 2014 [place], [place].  _______________________________________________________________________

## 2018-05-12 ENCOUNTER — Encounter (HOSPITAL_COMMUNITY): Payer: Self-pay

## 2018-05-12 ENCOUNTER — Encounter (HOSPITAL_COMMUNITY)
Admission: RE | Admit: 2018-05-12 | Discharge: 2018-05-12 | Disposition: A | Discharge status: Home / Self Care | Payer: Medicare Other | Source: Ambulatory Visit | Attending: Orthopedic Surgery | Admitting: Orthopedic Surgery

## 2018-05-12 ENCOUNTER — Other Ambulatory Visit: Payer: Self-pay

## 2018-05-12 DIAGNOSIS — I10 Essential (primary) hypertension: Secondary | ICD-10-CM | POA: Diagnosis not present

## 2018-05-12 DIAGNOSIS — Z87891 Personal history of nicotine dependence: Secondary | ICD-10-CM | POA: Diagnosis not present

## 2018-05-12 DIAGNOSIS — J449 Chronic obstructive pulmonary disease, unspecified: Secondary | ICD-10-CM | POA: Diagnosis not present

## 2018-05-12 DIAGNOSIS — Z79899 Other long term (current) drug therapy: Secondary | ICD-10-CM | POA: Diagnosis not present

## 2018-05-12 DIAGNOSIS — Z880 Allergy status to penicillin: Secondary | ICD-10-CM | POA: Diagnosis not present

## 2018-05-12 DIAGNOSIS — Z7982 Long term (current) use of aspirin: Secondary | ICD-10-CM | POA: Diagnosis not present

## 2018-05-12 DIAGNOSIS — Z01812 Encounter for preprocedural laboratory examination: Secondary | ICD-10-CM | POA: Insufficient documentation

## 2018-05-12 DIAGNOSIS — Z8546 Personal history of malignant neoplasm of prostate: Secondary | ICD-10-CM | POA: Diagnosis not present

## 2018-05-12 DIAGNOSIS — M1711 Unilateral primary osteoarthritis, right knee: Secondary | ICD-10-CM | POA: Diagnosis not present

## 2018-05-12 DIAGNOSIS — Z86718 Personal history of other venous thrombosis and embolism: Secondary | ICD-10-CM | POA: Diagnosis not present

## 2018-05-12 HISTORY — DX: Chronic obstructive pulmonary disease, unspecified: J44.9

## 2018-05-12 HISTORY — DX: Acute embolism and thrombosis of unspecified calf muscular vein: I82.469

## 2018-05-12 HISTORY — DX: Tinnitus, unspecified ear: H93.19

## 2018-05-12 HISTORY — DX: Essential (primary) hypertension: I10

## 2018-05-12 LAB — COMPREHENSIVE METABOLIC PANEL
ALK PHOS: 58 U/L (ref 38–126)
ALT: 25 U/L (ref 0–44)
ANION GAP: 10 (ref 5–15)
AST: 28 U/L (ref 15–41)
Albumin: 4 g/dL (ref 3.5–5.0)
BUN: 16 mg/dL (ref 8–23)
CO2: 28 mmol/L (ref 22–32)
Calcium: 9.1 mg/dL (ref 8.9–10.3)
Chloride: 102 mmol/L (ref 98–111)
Creatinine, Ser: 0.73 mg/dL (ref 0.61–1.24)
GFR calc Af Amer: >60 mL/min (ref >60)
GFR calc non Af Amer: >60 mL/min (ref >60)
GLUCOSE: 112 mg/dL — AB (ref 70–99)
Potassium: 4.2 mmol/L (ref 3.5–5.1)
Sodium: 140 mmol/L (ref 135–145)
Total Bilirubin: 0.8 mg/dL (ref 0.3–1.2)
Total Protein: 7.2 g/dL (ref 6.5–8.1)

## 2018-05-12 LAB — CBC
HCT: 49.9 % (ref 39.0–52.0)
Hemoglobin: 15.9 g/dL (ref 13.0–17.0)
MCH: 29.6 pg (ref 26.0–34.0)
MCHC: 31.9 g/dL (ref 30.0–36.0)
MCV: 92.9 fL (ref 80.0–100.0)
Platelets: 231 10*3/uL (ref 150–400)
RBC: 5.37 MIL/uL (ref 4.22–5.81)
RDW: 14.5 % (ref 11.5–15.5)
WBC: 7.2 10*3/uL (ref 4.0–10.5)
nRBC: 0 % (ref 0.0–0.2)

## 2018-05-12 LAB — SURGICAL PCR SCREEN
MRSA, PCR: NEGATIVE
Staphylococcus aureus: POSITIVE — AB

## 2018-05-12 LAB — ABO/RH: ABO/RH(D): A POS

## 2018-05-12 LAB — APTT: aPTT: 26 seconds (ref 24–36)

## 2018-05-12 LAB — PROTIME-INR
INR: 0.95
Prothrombin Time: 12.6 seconds (ref 11.4–15.2)

## 2018-05-14 MED ORDER — BUPIVACAINE LIPOSOME 1.3 % IJ SUSP
20.0000 mL | Freq: Once | INTRAMUSCULAR | Status: DC
  Filled 2018-05-14: qty 20

## 2018-05-14 NOTE — Anesthesia Preprocedure Evaluation (Addendum)
Anesthesia Evaluation  Patient identified by MRN, date of birth, ID band Patient awake    Reviewed: Allergy & Precautions, NPO status , Patient's Chart, lab work & pertinent test results  History of Anesthesia Complications Negative for: history of anesthetic complications  Airway Mallampati: II  TM Distance: >3 FB Neck ROM: Full    Dental no notable dental hx. (+) Dental Advisory Given   Pulmonary COPD,  COPD inhaler, former smoker,    Pulmonary exam normal        Cardiovascular hypertension, Pt. on medications Normal cardiovascular exam     Neuro/Psych negative neurological ROS     GI/Hepatic negative GI ROS, Neg liver ROS,   Endo/Other  negative endocrine ROS  Renal/GU negative Renal ROS     Musculoskeletal negative musculoskeletal ROS (+)   Abdominal   Peds  Hematology negative hematology ROS (+)   Anesthesia Other Findings Day of surgery medications reviewed with the patient.  Reproductive/Obstetrics                            Anesthesia Physical Anesthesia Plan  ASA: III  Anesthesia Plan: Spinal   Post-op Pain Management:  Regional for Post-op pain   Induction:   PONV Risk Score and Plan: 2 and Ondansetron and Propofol infusion  Airway Management Planned: Simple Face Mask  Additional Equipment:   Intra-op Plan:   Post-operative Plan: Extubation in OR  Informed Consent: I have reviewed the patients History and Physical, chart, labs and discussed the procedure including the risks, benefits and alternatives for the proposed anesthesia with the patient or authorized representative who has indicated his/her understanding and acceptance.     Dental advisory given  Plan Discussed with: CRNA and Anesthesiologist  Anesthesia Plan Comments:        Anesthesia Quick Evaluation

## 2018-05-15 ENCOUNTER — Inpatient Hospital Stay (HOSPITAL_COMMUNITY): Payer: Medicare Other | Admitting: Anesthesiology

## 2018-05-15 ENCOUNTER — Observation Stay (HOSPITAL_COMMUNITY)
Admission: RE | Admit: 2018-05-15 | Discharge: 2018-05-16 | Disposition: A | Discharge status: Home / Self Care | Payer: Medicare Other | Source: Ambulatory Visit | Attending: Orthopedic Surgery | Admitting: Orthopedic Surgery

## 2018-05-15 ENCOUNTER — Other Ambulatory Visit: Payer: Self-pay

## 2018-05-15 ENCOUNTER — Encounter (HOSPITAL_COMMUNITY): Payer: Self-pay | Admitting: *Deleted

## 2018-05-15 ENCOUNTER — Encounter (HOSPITAL_COMMUNITY)
Admission: RE | Disposition: A | Discharge status: Home / Self Care | Payer: Self-pay | Source: Ambulatory Visit | Attending: Orthopedic Surgery

## 2018-05-15 DIAGNOSIS — J449 Chronic obstructive pulmonary disease, unspecified: Secondary | ICD-10-CM | POA: Diagnosis not present

## 2018-05-15 DIAGNOSIS — Z79899 Other long term (current) drug therapy: Secondary | ICD-10-CM | POA: Insufficient documentation

## 2018-05-15 DIAGNOSIS — M179 Osteoarthritis of knee, unspecified: Secondary | ICD-10-CM | POA: Diagnosis present

## 2018-05-15 DIAGNOSIS — Z87891 Personal history of nicotine dependence: Secondary | ICD-10-CM | POA: Insufficient documentation

## 2018-05-15 DIAGNOSIS — Z7982 Long term (current) use of aspirin: Secondary | ICD-10-CM | POA: Diagnosis not present

## 2018-05-15 DIAGNOSIS — Z880 Allergy status to penicillin: Secondary | ICD-10-CM | POA: Insufficient documentation

## 2018-05-15 DIAGNOSIS — Z8546 Personal history of malignant neoplasm of prostate: Secondary | ICD-10-CM | POA: Insufficient documentation

## 2018-05-15 DIAGNOSIS — I10 Essential (primary) hypertension: Secondary | ICD-10-CM | POA: Insufficient documentation

## 2018-05-15 DIAGNOSIS — M1711 Unilateral primary osteoarthritis, right knee: Principal | ICD-10-CM | POA: Insufficient documentation

## 2018-05-15 DIAGNOSIS — M1710 Unilateral primary osteoarthritis, unspecified knee: Secondary | ICD-10-CM

## 2018-05-15 DIAGNOSIS — Z86718 Personal history of other venous thrombosis and embolism: Secondary | ICD-10-CM | POA: Insufficient documentation

## 2018-05-15 DIAGNOSIS — G8918 Other acute postprocedural pain: Secondary | ICD-10-CM | POA: Diagnosis not present

## 2018-05-15 HISTORY — DX: Dyspnea, unspecified: R06.00

## 2018-05-15 HISTORY — PX: TOTAL KNEE ARTHROPLASTY: SHX125

## 2018-05-15 LAB — TYPE AND SCREEN
ABO/RH(D): A POS
Antibody Screen: NEGATIVE

## 2018-05-15 SURGERY — ARTHROPLASTY, KNEE, TOTAL
Anesthesia: Spinal | Site: Knee | Laterality: Right

## 2018-05-15 MED ORDER — ACETAMINOPHEN 500 MG PO TABS
1000.0000 mg | ORAL_TABLET | Freq: Four times a day (QID) | ORAL | Status: AC
  Administered 2018-05-15 – 2018-05-16 (×4): 1000 mg via ORAL
  Filled 2018-05-15 (×4): qty 2

## 2018-05-15 MED ORDER — LOSARTAN POTASSIUM 50 MG PO TABS: 50.0000 mg | ORAL_TABLET | Freq: Every day | ORAL | Status: DC

## 2018-05-15 MED ORDER — GABAPENTIN 300 MG PO CAPS
300.0000 mg | ORAL_CAPSULE | Freq: Three times a day (TID) | ORAL | Status: DC
  Administered 2018-05-15 – 2018-05-16 (×3): 300 mg via ORAL
  Filled 2018-05-15 (×3): qty 1

## 2018-05-15 MED ORDER — PHENOL 1.4 % MT LIQD: 1.0000 | OROMUCOSAL | Status: DC | PRN

## 2018-05-15 MED ORDER — PROMETHAZINE HCL 25 MG/ML IJ SOLN: 6.2500 mg | INTRAMUSCULAR | Status: DC | PRN

## 2018-05-15 MED ORDER — SODIUM CHLORIDE (PF) 0.9 % IJ SOLN
INTRAMUSCULAR | Status: AC
  Filled 2018-05-15: qty 50

## 2018-05-15 MED ORDER — ACETAMINOPHEN 500 MG PO TABS: 1000.0000 mg | ORAL_TABLET | Freq: Once | ORAL | Status: DC

## 2018-05-15 MED ORDER — DEXAMETHASONE SODIUM PHOSPHATE 10 MG/ML IJ SOLN
8.0000 mg | Freq: Once | INTRAMUSCULAR | Status: AC
  Administered 2018-05-15: 8 mg via INTRAVENOUS

## 2018-05-15 MED ORDER — DIPHENHYDRAMINE HCL 12.5 MG/5ML PO ELIX: 12.5000 mg | ORAL_SOLUTION | ORAL | Status: DC | PRN

## 2018-05-15 MED ORDER — RIVAROXABAN 10 MG PO TABS
10.0000 mg | ORAL_TABLET | Freq: Every day | ORAL | Status: DC
  Administered 2018-05-16: 10 mg via ORAL
  Filled 2018-05-15: qty 1

## 2018-05-15 MED ORDER — PROPOFOL 10 MG/ML IV BOLUS
INTRAVENOUS | Status: DC | PRN
  Administered 2018-05-15: 20 mg via INTRAVENOUS

## 2018-05-15 MED ORDER — FLEET ENEMA 7-19 GM/118ML RE ENEM: 1.0000 | ENEMA | Freq: Once | RECTAL | Status: DC | PRN

## 2018-05-15 MED ORDER — BUPIVACAINE-EPINEPHRINE (PF) 0.5% -1:200000 IJ SOLN
INTRAMUSCULAR | Status: DC | PRN
  Administered 2018-05-15: 1.6 mL
  Administered 2018-05-15: 30 mL via PERINEURAL

## 2018-05-15 MED ORDER — PROPOFOL 10 MG/ML IV BOLUS
INTRAVENOUS | Status: AC
  Filled 2018-05-15: qty 20

## 2018-05-15 MED ORDER — DEXAMETHASONE SODIUM PHOSPHATE 10 MG/ML IJ SOLN: 10.0000 mg | Freq: Once | INTRAMUSCULAR | Status: DC

## 2018-05-15 MED ORDER — FENTANYL CITRATE (PF) 100 MCG/2ML IJ SOLN
INTRAMUSCULAR | Status: AC
  Filled 2018-05-15: qty 2

## 2018-05-15 MED ORDER — METHOCARBAMOL 500 MG IVPB - SIMPLE MED
500.0000 mg | Freq: Four times a day (QID) | INTRAVENOUS | Status: DC | PRN
  Filled 2018-05-15: qty 50

## 2018-05-15 MED ORDER — TRAMADOL HCL 50 MG PO TABS
50.0000 mg | ORAL_TABLET | Freq: Four times a day (QID) | ORAL | Status: DC | PRN
  Administered 2018-05-16: 100 mg via ORAL
  Filled 2018-05-15: qty 2

## 2018-05-15 MED ORDER — METOCLOPRAMIDE HCL 5 MG/ML IJ SOLN: 5.0000 mg | Freq: Three times a day (TID) | INTRAMUSCULAR | Status: DC | PRN

## 2018-05-15 MED ORDER — SODIUM CHLORIDE (PF) 0.9 % IJ SOLN
INTRAMUSCULAR | Status: AC
  Filled 2018-05-15: qty 10

## 2018-05-15 MED ORDER — HYDROCHLOROTHIAZIDE 12.5 MG PO CAPS: 12.5000 mg | ORAL_CAPSULE | Freq: Every day | ORAL | Status: DC

## 2018-05-15 MED ORDER — FENTANYL CITRATE (PF) 100 MCG/2ML IJ SOLN: 25.0000 ug | INTRAMUSCULAR | Status: DC | PRN

## 2018-05-15 MED ORDER — MENTHOL 3 MG MT LOZG: 1.0000 | LOZENGE | OROMUCOSAL | Status: DC | PRN

## 2018-05-15 MED ORDER — ONDANSETRON HCL 4 MG/2ML IJ SOLN: 4.0000 mg | Freq: Four times a day (QID) | INTRAMUSCULAR | Status: DC | PRN

## 2018-05-15 MED ORDER — HYDROCHLOROTHIAZIDE 12.5 MG PO CAPS
12.5000 mg | ORAL_CAPSULE | Freq: Every day | ORAL | Status: DC
  Administered 2018-05-15 – 2018-05-16 (×2): 12.5 mg via ORAL
  Filled 2018-05-15 (×2): qty 1

## 2018-05-15 MED ORDER — LACTATED RINGERS IV SOLN
INTRAVENOUS | Status: DC
  Administered 2018-05-15 (×2): via INTRAVENOUS

## 2018-05-15 MED ORDER — BUPIVACAINE IN DEXTROSE 0.75-8.25 % IT SOLN
INTRATHECAL | Status: DC | PRN
  Administered 2018-05-15: 1.6 mL via INTRATHECAL

## 2018-05-15 MED ORDER — FENTANYL CITRATE (PF) 100 MCG/2ML IJ SOLN
INTRAMUSCULAR | Status: DC | PRN
  Administered 2018-05-15: 100 ug via INTRAVENOUS

## 2018-05-15 MED ORDER — CHLORHEXIDINE GLUCONATE 4 % EX LIQD: 60.0000 mL | Freq: Once | CUTANEOUS | Status: DC

## 2018-05-15 MED ORDER — CEFAZOLIN SODIUM-DEXTROSE 2-4 GM/100ML-% IV SOLN
2.0000 g | Freq: Four times a day (QID) | INTRAVENOUS | Status: AC
  Administered 2018-05-15 (×2): 2 g via INTRAVENOUS
  Filled 2018-05-15 (×2): qty 100

## 2018-05-15 MED ORDER — SODIUM CHLORIDE 0.9 % IR SOLN
Status: DC | PRN
  Administered 2018-05-15 (×2): 1000 mL

## 2018-05-15 MED ORDER — TRANEXAMIC ACID-NACL 1000-0.7 MG/100ML-% IV SOLN
1000.0000 mg | Freq: Once | INTRAVENOUS | Status: AC
  Administered 2018-05-15: 1000 mg via INTRAVENOUS
  Filled 2018-05-15: qty 100

## 2018-05-15 MED ORDER — TRANEXAMIC ACID-NACL 1000-0.7 MG/100ML-% IV SOLN
1000.0000 mg | INTRAVENOUS | Status: AC
  Administered 2018-05-15: 1000 mg via INTRAVENOUS
  Filled 2018-05-15: qty 100

## 2018-05-15 MED ORDER — BISACODYL 10 MG RE SUPP: 10.0000 mg | Freq: Every day | RECTAL | Status: DC | PRN

## 2018-05-15 MED ORDER — ALBUTEROL SULFATE (2.5 MG/3ML) 0.083% IN NEBU: 2.5000 mg | INHALATION_SOLUTION | Freq: Four times a day (QID) | RESPIRATORY_TRACT | Status: DC | PRN

## 2018-05-15 MED ORDER — MIDAZOLAM HCL 5 MG/5ML IJ SOLN
INTRAMUSCULAR | Status: DC | PRN
  Administered 2018-05-15: 2 mg via INTRAVENOUS

## 2018-05-15 MED ORDER — ONDANSETRON HCL 4 MG/2ML IJ SOLN
INTRAMUSCULAR | Status: DC | PRN
  Administered 2018-05-15: 4 mg via INTRAVENOUS

## 2018-05-15 MED ORDER — DEXAMETHASONE SODIUM PHOSPHATE 10 MG/ML IJ SOLN
INTRAMUSCULAR | Status: AC
  Filled 2018-05-15: qty 1

## 2018-05-15 MED ORDER — PROPOFOL 500 MG/50ML IV EMUL
INTRAVENOUS | Status: DC | PRN
  Administered 2018-05-15: 100 ug/kg/min via INTRAVENOUS

## 2018-05-15 MED ORDER — METOCLOPRAMIDE HCL 5 MG PO TABS: 5.0000 mg | ORAL_TABLET | Freq: Three times a day (TID) | ORAL | Status: DC | PRN

## 2018-05-15 MED ORDER — MORPHINE SULFATE (PF) 2 MG/ML IV SOLN: 1.0000 mg | INTRAVENOUS | Status: DC | PRN

## 2018-05-15 MED ORDER — METHOCARBAMOL 500 MG PO TABS
500.0000 mg | ORAL_TABLET | Freq: Four times a day (QID) | ORAL | Status: DC | PRN
  Administered 2018-05-16: 500 mg via ORAL
  Filled 2018-05-15: qty 1

## 2018-05-15 MED ORDER — ACETAMINOPHEN 10 MG/ML IV SOLN
1000.0000 mg | Freq: Four times a day (QID) | INTRAVENOUS | Status: DC
  Administered 2018-05-15: 1000 mg via INTRAVENOUS
  Filled 2018-05-15: qty 100

## 2018-05-15 MED ORDER — ONDANSETRON HCL 4 MG/2ML IJ SOLN
INTRAMUSCULAR | Status: AC
  Filled 2018-05-15: qty 2

## 2018-05-15 MED ORDER — SODIUM CHLORIDE 0.9 % IV SOLN
INTRAVENOUS | Status: DC
  Administered 2018-05-15 (×2): via INTRAVENOUS

## 2018-05-15 MED ORDER — MIDAZOLAM HCL 2 MG/2ML IJ SOLN
INTRAMUSCULAR | Status: AC
  Filled 2018-05-15: qty 2

## 2018-05-15 MED ORDER — POLYETHYLENE GLYCOL 3350 17 G PO PACK: 17.0000 g | PACK | Freq: Every day | ORAL | Status: DC | PRN

## 2018-05-15 MED ORDER — LORAZEPAM 0.5 MG PO TABS: 0.5000 mg | ORAL_TABLET | Freq: Every evening | ORAL | Status: DC | PRN

## 2018-05-15 MED ORDER — DOCUSATE SODIUM 100 MG PO CAPS
100.0000 mg | ORAL_CAPSULE | Freq: Two times a day (BID) | ORAL | Status: DC
  Administered 2018-05-15 – 2018-05-16 (×2): 100 mg via ORAL
  Filled 2018-05-15 (×2): qty 1

## 2018-05-15 MED ORDER — FLUTICASONE PROPIONATE 50 MCG/ACT NA SUSP: 2.0000 | Freq: Every day | NASAL | Status: DC | PRN

## 2018-05-15 MED ORDER — LOSARTAN POTASSIUM 50 MG PO TABS
50.0000 mg | ORAL_TABLET | Freq: Every day | ORAL | Status: DC
  Administered 2018-05-15: 50 mg via ORAL
  Filled 2018-05-15: qty 1

## 2018-05-15 MED ORDER — BUPIVACAINE LIPOSOME 1.3 % IJ SUSP
INTRAMUSCULAR | Status: DC | PRN
  Administered 2018-05-15: 20 mL

## 2018-05-15 MED ORDER — OXYCODONE HCL 5 MG PO TABS
5.0000 mg | ORAL_TABLET | ORAL | Status: DC | PRN
  Administered 2018-05-15: 5 mg via ORAL
  Administered 2018-05-15 – 2018-05-16 (×4): 10 mg via ORAL
  Filled 2018-05-15 (×4): qty 2
  Filled 2018-05-15: qty 1

## 2018-05-15 MED ORDER — ONDANSETRON HCL 4 MG PO TABS: 4.0000 mg | ORAL_TABLET | Freq: Four times a day (QID) | ORAL | Status: DC | PRN

## 2018-05-15 MED ORDER — PROPOFOL 10 MG/ML IV BOLUS
INTRAVENOUS | Status: AC
  Filled 2018-05-15: qty 40

## 2018-05-15 MED ORDER — SODIUM CHLORIDE (PF) 0.9 % IJ SOLN
INTRAMUSCULAR | Status: DC | PRN
  Administered 2018-05-15: 60 mL via INTRAVENOUS

## 2018-05-15 MED ORDER — CEFAZOLIN SODIUM-DEXTROSE 2-4 GM/100ML-% IV SOLN
2.0000 g | INTRAVENOUS | Status: AC
  Administered 2018-05-15: 2 g via INTRAVENOUS
  Filled 2018-05-15: qty 100

## 2018-05-15 MED ORDER — ALBUTEROL SULFATE HFA 108 (90 BASE) MCG/ACT IN AERS: 2.0000 | INHALATION_SPRAY | Freq: Four times a day (QID) | RESPIRATORY_TRACT | Status: DC | PRN

## 2018-05-15 SURGICAL SUPPLY — 59 items
ATTUNE MED DOME PAT 41 KNEE (Knees) ×2 IMPLANT
ATTUNE MED DOME PAT 41MM KNEE (Knees) ×1 IMPLANT
ATTUNE PS FEM RT SZ 8 CEM KNEE (Femur) ×3 IMPLANT
ATTUNE PSRP INSR SZ8 7 KNEE (Insert) ×2 IMPLANT
ATTUNE PSRP INSR SZ8 7MM KNEE (Insert) ×1 IMPLANT
BAG ZIPLOCK 12X15 (MISCELLANEOUS) ×3 IMPLANT
BANDAGE ACE 6X5 VEL STRL LF (GAUZE/BANDAGES/DRESSINGS) ×3 IMPLANT
BASE TIBIAL ATTUNE KNEE SZ9 (Knees) ×1 IMPLANT
BLADE SAG 18X100X1.27 (BLADE) ×3 IMPLANT
BLADE SAW SGTL 11.0X1.19X90.0M (BLADE) ×3 IMPLANT
BLADE SURG SZ10 CARB STEEL (BLADE) ×6 IMPLANT
BOWL SMART MIX CTS (DISPOSABLE) ×3 IMPLANT
CEMENT HV SMART SET (Cement) ×6 IMPLANT
CLOSURE WOUND 1/2 X4 (GAUZE/BANDAGES/DRESSINGS) ×2
COVER SURGICAL LIGHT HANDLE (MISCELLANEOUS) ×3 IMPLANT
COVER WAND RF STERILE (DRAPES) IMPLANT
CUFF TOURN SGL QUICK 34 (TOURNIQUET CUFF) ×2
CUFF TRNQT CYL 34X4.125X (TOURNIQUET CUFF) ×1 IMPLANT
DECANTER SPIKE VIAL GLASS SM (MISCELLANEOUS) ×3 IMPLANT
DRAPE U-SHAPE 47X51 STRL (DRAPES) ×3 IMPLANT
DRSG ADAPTIC 3X8 NADH LF (GAUZE/BANDAGES/DRESSINGS) ×3 IMPLANT
DRSG PAD ABDOMINAL 8X10 ST (GAUZE/BANDAGES/DRESSINGS) ×3 IMPLANT
DURAPREP 26ML APPLICATOR (WOUND CARE) ×3 IMPLANT
ELECT REM PT RETURN 15FT ADLT (MISCELLANEOUS) ×3 IMPLANT
EVACUATOR 1/8 PVC DRAIN (DRAIN) ×3 IMPLANT
GAUZE SPONGE 4X4 12PLY STRL (GAUZE/BANDAGES/DRESSINGS) ×3 IMPLANT
GLOVE BIO SURGEON STRL SZ7 (GLOVE) ×3 IMPLANT
GLOVE BIO SURGEON STRL SZ8 (GLOVE) ×3 IMPLANT
GLOVE BIOGEL PI IND STRL 6.5 (GLOVE) ×1 IMPLANT
GLOVE BIOGEL PI IND STRL 7.0 (GLOVE) ×1 IMPLANT
GLOVE BIOGEL PI IND STRL 8 (GLOVE) ×1 IMPLANT
GLOVE BIOGEL PI INDICATOR 6.5 (GLOVE) ×2
GLOVE BIOGEL PI INDICATOR 7.0 (GLOVE) ×2
GLOVE BIOGEL PI INDICATOR 8 (GLOVE) ×2
GLOVE SURG SS PI 6.5 STRL IVOR (GLOVE) ×3 IMPLANT
GOWN STRL REUS W/TWL LRG LVL3 (GOWN DISPOSABLE) ×9 IMPLANT
HANDPIECE INTERPULSE COAX TIP (DISPOSABLE) ×2
HOLDER FOLEY CATH W/STRAP (MISCELLANEOUS) IMPLANT
IMMOBILIZER KNEE 20 (SOFTGOODS) ×3
IMMOBILIZER KNEE 20 THIGH 36 (SOFTGOODS) ×1 IMPLANT
MANIFOLD NEPTUNE II (INSTRUMENTS) ×3 IMPLANT
NS IRRIG 1000ML POUR BTL (IV SOLUTION) ×3 IMPLANT
PACK TOTAL KNEE CUSTOM (KITS) ×3 IMPLANT
PADDING CAST COTTON 6X4 STRL (CAST SUPPLIES) ×6 IMPLANT
PIN THREADED HEADED SIGMA (PIN) ×3 IMPLANT
PROTECTOR NERVE ULNAR (MISCELLANEOUS) ×3 IMPLANT
SET HNDPC FAN SPRY TIP SCT (DISPOSABLE) ×1 IMPLANT
STRIP CLOSURE SKIN 1/2X4 (GAUZE/BANDAGES/DRESSINGS) ×4 IMPLANT
SUT MNCRL AB 4-0 PS2 18 (SUTURE) ×3 IMPLANT
SUT STRATAFIX 0 PDS 27 VIOLET (SUTURE) ×3
SUT VIC AB 2-0 CT1 27 (SUTURE) ×6
SUT VIC AB 2-0 CT1 TAPERPNT 27 (SUTURE) ×3 IMPLANT
SUTURE STRATFX 0 PDS 27 VIOLET (SUTURE) ×1 IMPLANT
TIBIAL BASE ATTUNE KNEE SZ9 (Knees) ×2 IMPLANT
TIBIAL BASE ROT PLAT SZ 9 KNEE (Miscellaneous) ×3 IMPLANT
TRAY FOLEY MTR SLVR 16FR STAT (SET/KITS/TRAYS/PACK) ×3 IMPLANT
WATER STERILE IRR 1000ML POUR (IV SOLUTION) ×6 IMPLANT
WRAP KNEE MAXI GEL POST OP (GAUZE/BANDAGES/DRESSINGS) ×3 IMPLANT
YANKAUER SUCT BULB TIP 10FT TU (MISCELLANEOUS) ×3 IMPLANT

## 2018-05-15 NOTE — Anesthesia Postprocedure Evaluation (Signed)
Anesthesia Post Note  Patient: Wayne Roberson  Procedure(s) Performed: TOTAL KNEE ARTHROPLASTY (Right Knee)     Patient location during evaluation: PACU Anesthesia Type: Spinal Level of consciousness: awake and alert Pain management: pain level controlled Vital Signs Assessment: post-procedure vital signs reviewed and stable Respiratory status: spontaneous breathing and respiratory function stable Cardiovascular status: blood pressure returned to baseline and stable Postop Assessment: spinal receding Anesthetic complications: no    Last Vitals:  Vitals:   05/15/18 1000 05/15/18 1024  BP: 129/81 (!) 141/83  Pulse: 65 (!) 59  Resp: 16 16  Temp:  (!) 36.4 C  SpO2: 97% 99%    Last Pain:  Vitals:   05/15/18 1024  TempSrc: Oral  PainSc:                  Leba Tibbitts DANIEL

## 2018-05-15 NOTE — Transfer of Care (Signed)
Immediate Anesthesia Transfer of Care Note  Patient: Wayne Roberson  Procedure(s) Performed: TOTAL KNEE ARTHROPLASTY (Right Knee)  Patient Location: PACU  Anesthesia Type:Regional and Spinal  Level of Consciousness: sedated  Airway & Oxygen Therapy: Patient Spontanous Breathing and Patient connected to face mask oxygen  Post-op Assessment: Report given to RN and Post -op Vital signs reviewed and stable  Post vital signs: Reviewed and stable  Last Vitals:  Vitals Value Taken Time  BP 102/62 05/15/2018  8:45 AM  Temp    Pulse 76 05/15/2018  8:46 AM  Resp 20 05/15/2018  8:46 AM  SpO2 98 % 05/15/2018  8:46 AM  Vitals shown include unvalidated device data.  Last Pain:  Vitals:   05/15/18 0554  TempSrc: Oral  PainSc: 0-No pain         Complications: No apparent anesthesia complications

## 2018-05-15 NOTE — Discharge Instructions (Addendum)
° °Dr. Raidyn Aluisio °Total Joint Specialist °[facility] °[address] °[place], [place] [address] °[phone] ° °TOTAL KNEE REPLACEMENT POSTOPERATIVE DIRECTIONS ° °Knee Rehabilitation, Guidelines Following Surgery  °Results after knee surgery are often greatly improved when you follow the exercise, range of motion and muscle strengthening exercises prescribed by your doctor. Safety measures are also important to protect the knee from further injury. Any time any of these exercises cause you to have increased pain or swelling in your knee joint, decrease the amount until you are comfortable again and slowly increase them. If you have problems or questions, call your caregiver or physical therapist for advice.  ° °HOME CARE INSTRUCTIONS  °Remove items at home which could result in a fall. This includes throw rugs or furniture in walking pathways.  °· ICE to the affected knee every three hours for 30 minutes at a time and then as needed for pain and swelling.  Continue to use ice on the knee for pain and swelling from surgery. You may notice swelling that will progress down to the foot and ankle.  This is normal after surgery.  Elevate the leg when you are not up walking on it.   °· Continue to use the breathing machine which will help keep your temperature down.  It is common for your temperature to cycle up and down following surgery, especially at night when you are not up moving around and exerting yourself.  The breathing machine keeps your lungs expanded and your temperature down. °· Do not place pillow under knee, focus on keeping the knee straight while resting ° °DIET °You may resume your previous home diet once your are discharged from the hospital. ° °DRESSING / WOUND CARE / SHOWERING °You may shower 3 days after surgery, but keep the wounds dry during showering.  You may use an occlusive plastic wrap (Press'n Seal for example), NO SOAKING/SUBMERGING IN THE BATHTUB.  If the bandage gets  wet, change with a clean dry gauze.  If the incision gets wet, pat the wound dry with a clean towel. °You may start showering once you are discharged home but do not submerge the incision under water. Just pat the incision dry and apply a dry gauze dressing on daily. °Change the surgical dressing daily and reapply a dry dressing each time. ° °ACTIVITY °Walk with your walker as instructed. °Use walker as long as suggested by your caregivers. °Avoid periods of inactivity such as sitting longer than an hour when not asleep. This helps prevent blood clots.  °You may resume a sexual relationship in one month or when given the OK by your doctor.  °You may return to work once you are cleared by your doctor.  °Do not drive a car for 6 weeks or until released by you surgeon.  °Do not drive while taking narcotics. ° °WEIGHT BEARING °Weight bearing as tolerated with assist device (walker, cane, etc) as directed, use it as long as suggested by your surgeon or therapist, typically at least 4-6 weeks. ° °POSTOPERATIVE CONSTIPATION PROTOCOL °Constipation - defined medically as fewer than three stools per week and severe constipation as less than one stool per week. ° °One of the most common issues patients have following surgery is constipation.  Even if you have a regular bowel pattern at home, your normal regimen is likely to be disrupted due to multiple reasons following surgery.  Combination of anesthesia, postoperative narcotics, change in appetite and fluid intake all can affect your bowels.    In order to avoid complications following surgery, here are some recommendations in order to help you during your recovery period. ° °Colace (docusate) - Pick up an over-the-counter form of Colace or another stool softener and take twice a day as long as you are requiring postoperative pain medications.  Take with a full glass of water daily.  If you experience loose stools or diarrhea, hold the colace until you stool forms back up.  If  your symptoms do not get better within 1 week or if they get worse, check with your doctor. ° °Dulcolax (bisacodyl) - Pick up over-the-counter and take as directed by the product packaging as needed to assist with the movement of your bowels.  Take with a full glass of water.  Use this product as needed if not relieved by Colace only.  ° °MiraLax (polyethylene glycol) - Pick up over-the-counter to have on hand.  MiraLax is a solution that will increase the amount of water in your bowels to assist with bowel movements.  Take as directed and can mix with a glass of water, juice, soda, coffee, or tea.  Take if you go more than two days without a movement. °Do not use MiraLax more than once per day. Call your doctor if you are still constipated or irregular after using this medication for 7 days in a row. ° °If you continue to have problems with postoperative constipation, please contact the office for further assistance and recommendations.  If you experience "the worst abdominal pain ever" or develop nausea or vomiting, please contact the office immediatly for further recommendations for treatment. ° °ITCHING ° If you experience itching with your medications, try taking only a single pain pill, or even half a pain pill at a time.  You can also use Benadryl over the counter for itching or also to help with sleep.  ° °TED HOSE STOCKINGS °Wear the elastic stockings on both legs for three weeks following surgery during the day but you may remove then at night for sleeping. ° °MEDICATIONS °See your medication summary on the “After Visit Summary” that the nursing staff will review with you prior to discharge.  You may have some home medications which will be placed on hold until you complete the course of blood thinner medication.  It is important for you to complete the blood thinner medication as prescribed by your surgeon.  Continue your approved medications as instructed at time of discharge. ° °PRECAUTIONS °If you  experience chest pain or shortness of breath - call 911 immediately for transfer to the hospital emergency department.  °If you develop a fever greater that 101 F, purulent drainage from wound, increased redness or drainage from wound, foul odor from the wound/dressing, or calf pain - CONTACT YOUR SURGEON.   °                                                °FOLLOW-UP APPOINTMENTS °Make sure you keep all of your appointments after your operation with your surgeon and caregivers. You should call the office at the above phone number and make an appointment for approximately two weeks after the date of your surgery or on the date instructed by your surgeon outlined in the "After Visit Summary". ° ° °RANGE OF MOTION AND STRENGTHENING EXERCISES  °Rehabilitation of the knee is important following a knee injury or   an operation. After just a few days of immobilization, the muscles of the thigh which control the knee become weakened and shrink (atrophy). Knee exercises are designed to build up the tone and strength of the thigh muscles and to improve knee motion. Often times heat used for twenty to thirty minutes before working out will loosen up your tissues and help with improving the range of motion but do not use heat for the first two weeks following surgery. These exercises can be done on a training (exercise) mat, on the floor, on a table or on a bed. Use what ever works the best and is most comfortable for you Knee exercises include:  °Leg Lifts - While your knee is still immobilized in a splint or cast, you can do straight leg raises. Lift the leg to 60 degrees, hold for 3 sec, and slowly lower the leg. Repeat 10-20 times 2-3 times daily. Perform this exercise against resistance later as your knee gets better.  °Quad and Hamstring Sets - Tighten up the muscle on the front of the thigh (Quad) and hold for 5-10 sec. Repeat this 10-20 times hourly. Hamstring sets are done by pushing the foot backward against an object  and holding for 5-10 sec. Repeat as with quad sets.  °· Leg Slides: Lying on your back, slowly slide your foot toward your buttocks, bending your knee up off the floor (only go as far as is comfortable). Then slowly slide your foot back down until your leg is flat on the floor again. °· Angel Wings: Lying on your back spread your legs to the side as far apart as you can without causing discomfort.  °A rehabilitation program following serious knee injuries can speed recovery and prevent re-injury in the future due to weakened muscles. Contact your doctor or a physical therapist for more information on knee rehabilitation.  ° °IF YOU ARE TRANSFERRED TO A SKILLED REHAB FACILITY °If the patient is transferred to a skilled rehab facility following release from the hospital, a list of the current medications will be sent to the facility for the patient to continue.  When discharged from the skilled rehab facility, please have the facility set up the patient's Home Health Physical Therapy prior to being released. Also, the skilled facility will be responsible for providing the patient with their medications at time of release from the facility to include their pain medication, the muscle relaxants, and their blood thinner medication. If the patient is still at the rehab facility at time of the two week follow up appointment, the skilled rehab facility will also need to assist the patient in arranging follow up appointment in our office and any transportation needs. ° °MAKE SURE YOU:  °Understand these instructions.  °Get help right away if you are not doing well or get worse.  ° ° °Pick up stool softner and laxative for home use following surgery while on pain medications. °Do not submerge incision under water. °Please use good hand washing techniques while changing dressing each day. °May shower starting three days after surgery. °Please use a clean towel to pat the incision dry following showers. °Continue to use ice for  pain and swelling after surgery. °Do not use any lotions or creams on the incision until instructed by your surgeon. ° °Information on my medicine - XARELTO® (Rivaroxaban) ° ° ° °Why was Xarelto® prescribed for you? °Xarelto® was prescribed for you to reduce the risk of blood clots forming after orthopedic surgery. The medical term for   these abnormal blood clots is venous thromboembolism (VTE). ° °What do you need to know about xarelto® ? °Take your Xarelto® ONCE DAILY at the same time every day. °You may take it either with or without food. ° °If you have difficulty swallowing the tablet whole, you may crush it and mix in applesauce just prior to taking your dose. ° °Take Xarelto® exactly as prescribed by your doctor and DO NOT stop taking Xarelto® without talking to the doctor who prescribed the medication.  Stopping without other VTE prevention medication to take the place of Xarelto® may increase your risk of developing a clot. ° °After discharge, you should have regular check-up appointments with your healthcare provider that is prescribing your Xarelto®.   ° °What do you do if you miss a dose? °If you miss a dose, take it as soon as you remember on the same day then continue your regularly scheduled once daily regimen the next day. Do not take two doses of Xarelto® on the same day.  ° °Important Safety Information °A possible side effect of Xarelto® is bleeding. You should call your healthcare provider right away if you experience any of the following: °? Bleeding from an injury or your nose that does not stop. °? Unusual colored urine (red or dark brown) or unusual colored stools (red or black). °? Unusual bruising for unknown reasons. °? A serious fall or if you hit your head (even if there is no bleeding). ° °Some medicines may interact with Xarelto® and might increase your risk of bleeding while on Xarelto®. To help avoid this, consult your healthcare provider or pharmacist prior to using any new  prescription or non-prescription medications, including herbals, vitamins, non-steroidal anti-inflammatory drugs (NSAIDs) and supplements. ° °This website has more information on Xarelto®: www.xarelto.com. ° ° °

## 2018-05-15 NOTE — Anesthesia Procedure Notes (Signed)
Anesthesia Regional Block: Adductor canal block   Pre-Anesthetic Checklist: ,, timeout performed, Correct Patient, Correct Site, Correct Laterality, Correct Procedure, Correct Position, site marked, Risks and benefits discussed,  Surgical consent,  Pre-op evaluation,  At surgeon's request and post-op pain management  Laterality: Right  Prep: chloraprep       Needles:  Injection technique: Single-shot  Needle Type: Stimulator Needle - 80     Needle Length: 10cm  Needle Gauge: 21     Additional Needles:   Narrative:  Start time: 05/15/2018 6:38 AM End time: 05/15/2018 6:48 AM Injection made incrementally with aspirations every 5 mL.  Performed by: Personally

## 2018-05-15 NOTE — Anesthesia Procedure Notes (Signed)
Spinal  Patient location during procedure: OR Start time: 05/15/2018 7:11 AM End time: 05/15/2018 7:21 AM Staffing Anesthesiologist: Duane Boston, MD Performed: anesthesiologist  Preanesthetic Checklist Completed: patient identified, surgical consent, pre-op evaluation, timeout performed, IV checked, risks and benefits discussed and monitors and equipment checked Spinal Block Patient position: sitting Prep: DuraPrep Patient monitoring: cardiac monitor, continuous pulse ox and blood pressure Approach: midline Location: L2-3 Injection technique: single-shot Needle Needle type: Pencan  Needle gauge: 24 G Needle length: 9 cm Additional Notes Functioning IV was confirmed and monitors were applied. Sterile prep and drape, including hand hygiene and sterile gloves were used. The patient was positioned and the spine was prepped. The skin was anesthetized with lidocaine.  Free flow of clear CSF was obtained prior to injecting local anesthetic into the CSF.  The spinal needle aspirated freely following injection.  The needle was carefully withdrawn.  The patient tolerated the procedure well.

## 2018-05-15 NOTE — Interval H&P Note (Signed)
History and Physical Interval Note:  05/15/2018 6:37 AM  Wayne Roberson  has presented today for surgery, with the diagnosis of right knee osteoarthritis  The various methods of treatment have been discussed with the patient and family. After consideration of risks, benefits and other options for treatment, the patient has consented to  Procedure(s) with comments: TOTAL KNEE ARTHROPLASTY (Right) - 9min as a surgical intervention .  The patient's history has been reviewed, patient examined, no change in status, stable for surgery.  I have reviewed the patient's chart and labs.  Questions were answered to the patient's satisfaction.     Pilar Plate [name]

## 2018-05-15 NOTE — Op Note (Signed)
OPERATIVE REPORT-TOTAL KNEE ARTHROPLASTY   Pre-operative diagnosis- Osteoarthritis  Right knee(s)  Post-operative diagnosis- Osteoarthritis Right knee(s)  Procedure-  Right  Total Knee Arthroplasty  Surgeon- Dione Plover. Enrrique Mierzwa, MD  Assistant- Theresa Duty, PA-C   Anesthesia-  Addductor canal block and spinal  EBL-20 mL   Drains Hemovac  Tourniquet time- 36 minutes @ 161 mm Hg  Complications- None  Condition-PACU - hemodynamically stable.   Brief Clinical Note  Wayne Roberson is a 73 y.o. year old male with end stage OA of his right knee with progressively worsening pain and dysfunction. He has constant pain, with activity and at rest and significant functional deficits with difficulties even with ADLs. He has had extensive non-op management including analgesics, injections of cortisone and viscosupplements, and home exercise program, but remains in significant pain with significant dysfunction. Radiographs show bone on bone arthritis medial and patellofemoral. He presents now for right Total Knee Arthroplasty.    Procedure in detail---   The patient is brought into the operating room and positioned supine on the operating table. After successful administration of  Adductor canal block and spinal,   a tourniquet is placed high on the  Right thigh(s) and the lower extremity is prepped and draped in the usual sterile fashion. Time out is performed by the operating team and then the  Right lower extremity is wrapped in Esmarch, knee flexed and the tourniquet inflated to 300 mmHg.       A midline incision is made with a ten blade through the subcutaneous tissue to the level of the extensor mechanism. A fresh blade is used to make a medial parapatellar arthrotomy. Soft tissue over the proximal medial tibia is subperiosteally elevated to the joint line with a knife and into the semimembranosus bursa with a Cobb elevator. Soft tissue over the proximal lateral tibia is elevated with  attention being paid to avoiding the patellar tendon on the tibial tubercle. The patella is everted, knee flexed 90 degrees and the ACL and PCL are removed. Findings are bone on bone medial and patellofemoral with large global osteophytes.        The drill is used to create a starting hole in the distal femur and the canal is thoroughly irrigated with sterile saline to remove the fatty contents. The 5 degree Right  valgus alignment guide is placed into the femoral canal and the distal femoral cutting block is pinned to remove 9 mm off the distal femur. Resection is made with an oscillating saw.      The tibia is subluxed forward and the menisci are removed. The extramedullary alignment guide is placed referencing proximally at the medial aspect of the tibial tubercle and distally along the second metatarsal axis and tibial crest. The block is pinned to remove 66mm off the more deficient medial  side. Resection is made with an oscillating saw. Size 9is the most appropriate size for the tibia and the proximal tibia is prepared with the modular drill and keel punch for that size.      The femoral sizing guide is placed and size 8 is most appropriate. Rotation is marked off the epicondylar axis and confirmed by creating a rectangular flexion gap at 90 degrees. The size 8 cutting block is pinned in this rotation and the anterior, posterior and chamfer cuts are made with the oscillating saw. The intercondylar block is then placed and that cut is made.      Trial size 9 tibial component, trial size 8 posterior  stabilized femur and a 7  mm posterior stabilized rotating platform insert trial is placed. Full extension is achieved with excellent varus/valgus and anterior/posterior balance throughout full range of motion. The patella is everted and thickness measured to be 27  mm. Free hand resection is taken to 15 mm, a 41 template is placed, lug holes are drilled, trial patella is placed, and it tracks normally.  Osteophytes are removed off the posterior femur with the trial in place. All trials are removed and the cut bone surfaces prepared with pulsatile lavage. Cement is mixed and once ready for implantation, the size 9 tibial implant, size  8 posterior stabilized femoral component, and the size 41 patella are cemented in place and the patella is held with the clamp. The trial insert is placed and the knee held in full extension. The Exparel (20 ml mixed with 60 ml saline) is injected into the extensor mechanism, posterior capsule, medial and lateral gutters and subcutaneous tissues.  All extruded cement is removed and once the cement is hard the permanent 7 mm posterior stabilized rotating platform insert is placed into the tibial tray.      The wound is copiously irrigated with saline solution and the extensor mechanism closed over a hemovac drain with #1 V-loc suture. The tourniquet is released for a total tourniquet time of 36  minutes. Flexion against gravity is 140 degrees and the patella tracks normally. Subcutaneous tissue is closed with 2.0 vicryl and subcuticular with running 4.0 Monocryl. The incision is cleaned and dried and steri-strips and a bulky sterile dressing are applied. The limb is placed into a knee immobilizer and the patient is awakened and transported to recovery in stable condition.      Please note that a surgical assistant was a medical necessity for this procedure in order to perform it in a safe and expeditious manner. Surgical assistant was necessary to retract the ligaments and vital neurovascular structures to prevent injury to them and also necessary for proper positioning of the limb to allow for anatomic placement of the prosthesis.   Dione Plover Cedarius Kersh, MD    05/15/2018, 8:18 AM

## 2018-05-15 NOTE — Care Plan (Signed)
Ortho Bundle Case Management Note  Patient Details  Name: Wayne Roberson MRN: [id number] Date of Birth: Jan 01, 1946  R TKA scheduled on 05-15-2018 DCP: Home with spouse.  2 story home with 2 ste. DME:  RW ordered through [facility].  Has elevated toilets, doesn't need a 3-in-1. PT:  [facility]                   DME Arranged:  Walker rolling DME Agency:  [employer]  HH Arranged:  NA HH Agency:  NA  Additional Comments: Please contact me with any questions of if this plan should need to change.  Marianne Sofia, RN,CCM [facility]  [phone] 05/15/2018, 8:57 AM

## 2018-05-15 NOTE — Evaluation (Signed)
Physical Therapy Evaluation Patient Details Name: Wayne Roberson MRN: [id number] DOB: Apr 06, 1945 Today's Date: 05/15/2018   History of Present Illness  s/p R TKA  Clinical Impression  Pt is s/p TKA resulting in the deficits listed below (see PT Problem List).  PT doing very well, amb ~40' with RW. Pain controlled at time of eval. Cautioned pt not to overdo as his wife states he has tendency to do too much. Will  Continue PT, pt is hopeful to d/c tomorrow  Pt will benefit from skilled PT to increase their independence and safety with mobility to allow discharge to the venue listed below.      Follow Up Recommendations Follow surgeon's recommendation for DC plan and follow-up therapies(VERA)    Equipment Recommendations  Rolling walker with 5" wheels    Recommendations for Other Services       Precautions / Restrictions Precautions Precautions: Fall;Knee Precaution Comments: independent SLRs today, KI not utilized Required Braces or Orthoses: Knee Immobilizer - Right Knee Immobilizer - Right: Discontinue once straight leg raise with < 10 degree lag Restrictions Weight Bearing Restrictions: No Other Position/Activity Restrictions: WBAT      Mobility  Bed Mobility Overal bed mobility: Needs Assistance Bed Mobility: Supine to Sit     Supine to sit: Min guard     General bed mobility comments: for safety  Transfers Overall transfer level: Needs assistance Equipment used: Rolling walker (2 wheeled) Transfers: Sit to/from Stand Sit to Stand: Min assist;Min guard         General transfer comment: cues for hand placement  Ambulation/Gait Ambulation/Gait assistance: Min guard Gait Distance (Feet): 40 Feet Assistive device: Rolling walker (2 wheeled) Gait Pattern/deviations: Step-to pattern;Decreased stance time - right     General Gait Details: cues for sequence and RW safety  Stairs            Wheelchair Mobility    Modified Rankin (Stroke Patients  Only)       Balance Overall balance assessment: Needs assistance   Sitting balance-Leahy Scale: Good       Standing balance-Leahy Scale: Poor Standing balance comment: reliant on UEs                             Pertinent Vitals/Pain Pain Assessment: 0-10 Pain Score: 3  Pain Location: right knee Pain Descriptors / Indicators: Aching Pain Intervention(s): Limited activity within patient's tolerance;Monitored during session    Home Living Family/patient expects to be discharged to:: Private residence Living Arrangements: Spouse/significant other Available Help at Discharge: Family Type of Home: House Home Access: Stairs to enter   [employer] of Steps: 2 stairs in garage Home Layout: Multi-level;Able to live on main level with bedroom/bathroom Home Equipment: Gilford Rile - 2 wheels      Prior Function Level of Independence: Independent               Hand Dominance        Extremity/Trunk Assessment   Upper Extremity Assessment Upper Extremity Assessment: Defer to OT evaluation    Lower Extremity Assessment Lower Extremity Assessment: RLE deficits/detail RLE Deficits / Details: knee extension and hip flexion 3/5; knee flexion to 55* AAROM; ankle WFL       Communication   Communication: No difficulties  Cognition Arousal/Alertness: Awake/alert Behavior During Therapy: WFL for tasks assessed/performed Overall Cognitive Status: Within Functional Limits for tasks assessed  General Comments      Exercises Total Joint Exercises Ankle Circles/Pumps: AROM;10 reps;Both Quad Sets: AROM;10 reps;Both Heel Slides: AROM;5 reps;Right Straight Leg Raises: AROM;5 reps;Right   Assessment/Plan    PT Assessment Patient needs continued PT services  PT Problem List Decreased strength;Decreased range of motion;Decreased activity tolerance;Decreased balance;Pain;Decreased mobility;Decreased  knowledge of use of DME;Decreased knowledge of precautions       PT Treatment Interventions Gait training;DME instruction;Functional mobility training;Therapeutic activities;Stair training;Therapeutic exercise;Balance training;Patient/family education    PT Goals (Current goals can be found in the Care Plan section)  Acute Rehab PT Goals Patient Stated Goal: home Tuesday PT Goal Formulation: With patient Time For Goal Achievement: 05/22/18 Potential to Achieve Goals: Good    Frequency 7X/week   Barriers to discharge        Co-evaluation               AM-PAC PT "6 Clicks" Mobility  Outcome Measure Help needed turning from your back to your side while in a flat bed without using bedrails?: A Little Help needed moving from lying on your back to sitting on the side of a flat bed without using bedrails?: A Little Help needed moving to and from a bed to a chair (including a wheelchair)?: A Little Help needed standing up from a chair using your arms (e.g., wheelchair or bedside chair)?: A Little Help needed to walk in hospital room?: A Little Help needed climbing 3-5 steps with a railing? : A Lot 6 Click Score: 17    End of Session Equipment Utilized During Treatment: Gait belt Activity Tolerance: Patient tolerated treatment well Patient left: in chair;with chair alarm set;with family/visitor present   PT Visit Diagnosis: Difficulty in walking, not elsewhere classified (R26.2)    Time: 8527-7824 PT Time Calculation (min) (ACUTE ONLY): 26 min   Charges:   PT Evaluation $PT Eval Low Complexity: 1 Low PT Treatments $Gait Training: 8-22 mins        Kenyon Ana, PT  Pager: [phone] Acute Rehab Dept [facility]): [phone]   05/15/2018   [facility] 05/15/2018, 3:40 PM

## 2018-05-16 ENCOUNTER — Encounter (HOSPITAL_COMMUNITY): Payer: Self-pay | Admitting: Orthopedic Surgery

## 2018-05-16 DIAGNOSIS — M1711 Unilateral primary osteoarthritis, right knee: Secondary | ICD-10-CM | POA: Diagnosis not present

## 2018-05-16 DIAGNOSIS — I10 Essential (primary) hypertension: Secondary | ICD-10-CM | POA: Diagnosis not present

## 2018-05-16 DIAGNOSIS — Z7982 Long term (current) use of aspirin: Secondary | ICD-10-CM | POA: Diagnosis not present

## 2018-05-16 DIAGNOSIS — J449 Chronic obstructive pulmonary disease, unspecified: Secondary | ICD-10-CM | POA: Diagnosis not present

## 2018-05-16 DIAGNOSIS — Z880 Allergy status to penicillin: Secondary | ICD-10-CM | POA: Diagnosis not present

## 2018-05-16 DIAGNOSIS — Z79899 Other long term (current) drug therapy: Secondary | ICD-10-CM | POA: Diagnosis not present

## 2018-05-16 LAB — CBC
HCT: 40.6 % (ref 39.0–52.0)
Hemoglobin: 12.8 g/dL — ABNORMAL LOW (ref 13.0–17.0)
MCH: 29.5 pg (ref 26.0–34.0)
MCHC: 31.5 g/dL (ref 30.0–36.0)
MCV: 93.5 fL (ref 80.0–100.0)
PLATELETS: 182 10*3/uL (ref 150–400)
RBC: 4.34 MIL/uL (ref 4.22–5.81)
RDW: 14 % (ref 11.5–15.5)
WBC: 14.2 10*3/uL — AB (ref 4.0–10.5)
nRBC: 0 % (ref 0.0–0.2)

## 2018-05-16 LAB — BASIC METABOLIC PANEL
Anion gap: 6 (ref 5–15)
BUN: 11 mg/dL (ref 8–23)
CO2: 27 mmol/L (ref 22–32)
Calcium: 8 mg/dL — ABNORMAL LOW (ref 8.9–10.3)
Chloride: 105 mmol/L (ref 98–111)
Creatinine, Ser: 0.75 mg/dL (ref 0.61–1.24)
Glucose, Bld: 127 mg/dL — ABNORMAL HIGH (ref 70–99)
Potassium: 3.5 mmol/L (ref 3.5–5.1)
Sodium: 138 mmol/L (ref 135–145)

## 2018-05-16 MED ORDER — TRAMADOL HCL 50 MG PO TABS: 50.0000 mg | ORAL_TABLET | Freq: Four times a day (QID) | ORAL | 0 refills | Status: DC | PRN

## 2018-05-16 MED ORDER — RIVAROXABAN 10 MG PO TABS: 10.0000 mg | ORAL_TABLET | Freq: Every day | ORAL | 0 refills | Status: DC

## 2018-05-16 MED ORDER — METHOCARBAMOL 500 MG PO TABS: 500.0000 mg | ORAL_TABLET | Freq: Four times a day (QID) | ORAL | 0 refills | Status: DC | PRN

## 2018-05-16 MED ORDER — GABAPENTIN 300 MG PO CAPS: 300.0000 mg | ORAL_CAPSULE | Freq: Three times a day (TID) | ORAL | 0 refills | Status: DC

## 2018-05-16 MED ORDER — OXYCODONE HCL 5 MG PO TABS: 5.0000 mg | ORAL_TABLET | Freq: Four times a day (QID) | ORAL | 0 refills | Status: DC | PRN

## 2018-05-16 NOTE — Plan of Care (Signed)
Patient is to discharge home, in stable condition. He is waiting for delivery of rolling walker

## 2018-05-16 NOTE — Progress Notes (Signed)
   Subjective: 1 Day Post-Op Procedure(s) (LRB): TOTAL KNEE ARTHROPLASTY (Right) Patient reports pain as mild.   Patient seen in rounds by Dr. Wynelle Link. Patient is well, and has had no acute complaints or problems other than pain in the right knee. No issues overnight. Denies chest pain, SOB, or calf pain. Foley catheter removed this AM.  We will continue therapy today.   Objective: Vital signs in last 24 hours: Temp:  [97.5 F (36.4 C)-99 F (37.2 C)] 97.8 F (36.6 C) (02/18 0508) Pulse Rate:  [59-95] 64 (02/18 0508) Resp:  [12-20] 15 (02/18 0508) BP: (102-152)/(62-93) 140/77 (02/18 0508) SpO2:  [95 %-100 %] 99 % (02/18 0508) Weight:  [86.2 kg] 86.2 kg (02/17 1024)  Intake/Output from previous day:  Intake/Output Summary (Last 24 hours) at 05/16/2018 0717 Last data filed at 05/16/2018 6433 Gross per 24 hour  Intake 3883.65 ml  Output 2720 ml  Net 1163.65 ml    Labs: Recent Labs    05/16/18 0446  HGB 12.8*   Recent Labs    05/16/18 0446  WBC 14.2*  RBC 4.34  HCT 40.6  PLT 182   Recent Labs    05/16/18 0446  NA 138  K 3.5  CL 105  CO2 27  BUN 11  CREATININE 0.75  GLUCOSE 127*  CALCIUM 8.0*   Exam: General - Patient is Alert and Oriented Extremity - Neurologically intact Neurovascular intact Sensation intact distally Dorsiflexion/Plantar flexion intact Dressing - dressing C/D/I Motor Function - intact, moving foot and toes well on exam.   Past Medical History:  Diagnosis Date  . Arthritis    knees  . COPD (chronic obstructive pulmonary disease) (HCC)    NEWLY DX , NOW RX'D INHALER PRN , NOT HAD TO USE YET   . Deep vein thrombosis (DVT) of calf muscle vein 3 YEARS AGO   LEFT CALF , UNSURE HOW IT WAS TREATED   . Dyspnea    exercise  . Hypertension   . Prostate cancer ([place])   . Tinnitus   . Trace cataracts     Assessment/Plan: 1 Day Post-Op Procedure(s) (LRB): TOTAL KNEE ARTHROPLASTY (Right) Principal Problem:   DJD (degenerative joint  disease) of knee Active Problems:   OA (osteoarthritis) of knee  Estimated body mass index is 27.27 kg/m as calculated from the following:   Height as of this encounter: 5\' 10"  (1.778 m).   Weight as of this encounter: 86.2 kg. Advance diet Up with therapy D/C IV fluids  Anticipated LOS equal to or greater than 2 midnights due to - Age 75 and older with one or more of the following:  - Obesity  - Expected need for hospital services (PT, OT, Nursing) required for safe  discharge  - Anticipated need for postoperative skilled nursing care or inpatient rehab  - Active co-morbidities: DVT/VTE OR   - Unanticipated findings during/Post Surgery: None  - Patient is a high risk of re-admission due to: None    DVT Prophylaxis - Xarelto Weight bearing as tolerated. D/C O2 and pulse ox and try on room air. Hemovac pulled without difficulty, will continue therapy today.  Plan is to go Home after hospital stay. Plan for discharge to home later today if progresses with therapy and meeting his goals. Scheduled for virtual physical therapy. Follow-up in the office in 2 weeks.   Theresa Duty, PA-C Orthopedic Surgery 05/16/2018, 7:17 AM

## 2018-05-16 NOTE — Care Management Note (Signed)
Case Management Note  Patient Details  Name: Wayne Roberson MRN: [id number] Date of Birth: 06-03-1945  Subjective/Objective:                  discharged  Action/Plan: Has equipment at home hhc-virtual pt  Expected Discharge Date:  05/16/18               Expected Discharge Plan:  Home/Self Care  In-House Referral:     Discharge planning Services  CM Consult  Post Acute Care Choice:    Choice offered to:     DME Arranged:  Walker rolling DME Agency:  [employer]  HH Arranged:  NA HH Agency:  NA  Status of Service:  Completed, signed off  If discussed at [place] of Stay Meetings, dates discussed:    Additional Comments:  Leeroy Cha, RN 05/16/2018, 11:03 AM

## 2018-05-16 NOTE — Progress Notes (Signed)
Physical Therapy Treatment Patient Details Name: Wayne Roberson MRN: [id number] DOB: 1945-12-17 Today's Date: 05/16/2018    History of Present Illness s/p R TKA    PT Comments    Pt progressing well; reviewed stairs, HEP, gait and amb safety;  Pt is ready for d/c. All questions and concerns addressed reagrding mobility. Wife present for session.  Follow Up Recommendations  Follow surgeon's recommendation for DC plan and follow-up therapies(VERA)     Equipment Recommendations  Rolling walker with 5" wheels    Recommendations for Other Services       Precautions / Restrictions Precautions Precautions: Fall;Knee Required Braces or Orthoses: Knee Immobilizer - Right Knee Immobilizer - Right: Discontinue once straight leg raise with < 10 degree lag Restrictions Weight Bearing Restrictions: No Other Position/Activity Restrictions: WBAT    Mobility  Bed Mobility Overal bed mobility: Modified Independent                Transfers Overall transfer level: Needs assistance Equipment used: Rolling walker (2 wheeled) Transfers: Sit to/from Stand Sit to Stand: Supervision;Min guard         General transfer comment: cues for hand placement  Ambulation/Gait Ambulation/Gait assistance: Min guard;Supervision Gait Distance (Feet): 80 Feet Assistive device: Rolling walker (2 wheeled) Gait Pattern/deviations: Step-to pattern;Decreased stance time - right     General Gait Details: cues for sequence and RW safety   Stairs Stairs: Yes Stairs assistance: Min guard Stair Management: One rail Right;Forwards(HHA on L to simulate cane) Number of Stairs: 3 General stair comments: cues for sequence and safety   Wheelchair Mobility    Modified Rankin (Stroke Patients Only)       Balance             Standing balance-Leahy Scale: Fair                              Cognition Arousal/Alertness: Awake/alert Behavior During Therapy: WFL for tasks  assessed/performed Overall Cognitive Status: Within Functional Limits for tasks assessed                                        Exercises Total Joint Exercises Ankle Circles/Pumps: AROM;10 reps;Both Quad Sets: AROM;10 reps;Both Towel Squeeze: AROM;Both;10 reps Short Arc Quad: AROM;Right;10 reps Heel Slides: AAROM;Right;10 reps Hip ABduction/ADduction: AAROM;Right;10 reps Straight Leg Raises: AROM;AAROM;Right;10 reps    General Comments        Pertinent Vitals/Pain Pain Assessment: 0-10 Pain Score: 4  Pain Location: right knee Pain Descriptors / Indicators: Aching Pain Intervention(s): Limited activity within patient's tolerance;Monitored during session;RN gave pain meds during session;Ice applied;Repositioned    Home Living                      Prior Function            PT Goals (current goals can now be found in the care plan section) Acute Rehab PT Goals Patient Stated Goal: home Tuesday PT Goal Formulation: With patient Time For Goal Achievement: 05/22/18 Potential to Achieve Goals: Good Progress towards PT goals: Progressing toward goals    Frequency    7X/week      PT Plan Current plan remains appropriate    Co-evaluation              AM-PAC PT "6 Clicks" Mobility   Outcome Measure  Help needed turning from your back to your side while in a flat bed without using bedrails?: A Little Help needed moving from lying on your back to sitting on the side of a flat bed without using bedrails?: A Little Help needed moving to and from a bed to a chair (including a wheelchair)?: A Little Help needed standing up from a chair using your arms (e.g., wheelchair or bedside chair)?: A Little Help needed to walk in hospital room?: A Little Help needed climbing 3-5 steps with a railing? : A Little 6 Click Score: 18    End of Session Equipment Utilized During Treatment: Gait belt Activity Tolerance: Patient tolerated treatment  well Patient left: in chair;with call bell/phone within reach;with chair alarm set;with family/visitor present Nurse Communication: Mobility status;Other (comment)(ready for d/c) PT Visit Diagnosis: Difficulty in walking, not elsewhere classified (R26.2)     Time: 1000-1028 PT Time Calculation (min) (ACUTE ONLY): 28 min  Charges:  $Gait Training: 8-22 mins $Therapeutic Exercise: 8-22 mins                     Kenyon Ana, PT  Pager: [phone] Acute Rehab Dept [facility]): [phone]   05/16/2018    [facility] 05/16/2018, 10:35 AM

## 2018-05-16 NOTE — Care Management CC44 (Signed)
Condition Code 44 Documentation Completed  Patient Details  Name: Wayne Roberson MRN: [id number] Date of Birth: [birth year]   Condition Code 44 given:  Yes Patient signature on Condition Code 44 notice:  Yes Documentation of 2 MD's agreement:  Yes Code 44 added to claim:  Yes    Leeroy Cha, RN 05/16/2018, 11:57 AM

## 2018-05-16 NOTE — Care Management Obs Status (Signed)
MEDICARE OBSERVATION STATUS NOTIFICATION   Patient Details  Name: Wayne Roberson MRN: [id number] Date of Birth: [birth year]   Medicare Observation Status Notification Given:  Yes    Leeroy Cha, RN 05/16/2018, 10:51 AM

## 2018-05-17 NOTE — Discharge Summary (Signed)
Physician Discharge Summary   Patient ID: [place] Roberson MRN: [id number] DOB/AGE: 75/21/1947 75 y.o.  Admit date: 05/15/2018 Discharge date: 05/16/2018  Primary Diagnosis: Osteoarthritis, right knee   Admission Diagnoses:  Past Medical History:  Diagnosis Date  . Arthritis    knees  . COPD (chronic obstructive pulmonary disease) (HCC)    NEWLY DX , NOW RX'D INHALER PRN , NOT HAD TO USE YET   . Deep vein thrombosis (DVT) of calf muscle vein 3 YEARS AGO   LEFT CALF , UNSURE HOW IT WAS TREATED   . Dyspnea    exercise  . Hypertension   . Prostate cancer ([place] Roberson)   . Tinnitus   . Trace cataracts    Discharge Diagnoses:   Principal Problem:   DJD (degenerative joint disease) of knee Active Problems:   OA (osteoarthritis) of knee  Estimated body mass index is 27.27 kg/m as calculated from the following:   Height as of this encounter: 5\' 10"  (1.778 m).   Weight as of this encounter: 86.2 kg.  Procedure:  Procedure(s) (LRB): TOTAL KNEE ARTHROPLASTY (Right)   Consults: None  HPI: [place] Roberson is a 73 y.o. year old male with end stage OA of his right knee with progressively worsening pain and dysfunction. He has constant pain, with activity and at rest and significant functional deficits with difficulties even with ADLs. He has had extensive non-op management including analgesics, injections of cortisone and viscosupplements, and home exercise program, but remains in significant pain with significant dysfunction. Radiographs show bone on bone arthritis medial and patellofemoral. He presents now for right Total Knee Arthroplasty.    Laboratory Data: Admission on 05/15/2018, Discharged on 05/16/2018  Component Date Value Ref Range Status  . WBC 05/16/2018 14.2* 4.0 - 10.5 K/uL Final  . RBC 05/16/2018 4.34  4.22 - 5.81 MIL/uL Final  . Hemoglobin 05/16/2018 12.8* 13.0 - 17.0 g/dL Final  . HCT 05/16/2018 40.6  39.0 - 52.0 % Final  . MCV 05/16/2018 93.5  80.0 - 100.0 fL Final  .  MCH 05/16/2018 29.5  26.0 - 34.0 pg Final  . MCHC 05/16/2018 31.5  30.0 - 36.0 g/dL Final  . RDW 05/16/2018 14.0  11.5 - 15.5 % Final  . Platelets 05/16/2018 182  150 - 400 K/uL Final  . nRBC 05/16/2018 0.0  0.0 - 0.2 % Final   Performed at [facility], [place] [address] [place], [place] [address]  . Sodium 05/16/2018 138  135 - 145 mmol/L Final  . Potassium 05/16/2018 3.5  3.5 - 5.1 mmol/L Final  . Chloride 05/16/2018 105  98 - 111 mmol/L Final  . CO2 05/16/2018 27  22 - 32 mmol/L Final  . Glucose, Bld 05/16/2018 127* 70 - 99 mg/dL Final  . BUN 05/16/2018 11  8 - 23 mg/dL Final  . Creatinine, Ser 05/16/2018 0.75  0.61 - 1.24 mg/dL Final  . Calcium 05/16/2018 8.0* 8.9 - 10.3 mg/dL Final  . GFR calc non Af Amer 05/16/2018 >60  >60 mL/min Final  . GFR calc Af Amer 05/16/2018 >60  >60 mL/min Final  . Anion gap 05/16/2018 6  5 - 15 Final   Performed at [facility], [place] [address] [place], [place] [address]  Hospital Outpatient Visit on 05/12/2018  Component Date Value Ref Range Status  . aPTT 05/12/2018 26  24 - 36 seconds Final   Performed at [facility], [place] [address], [place],  [address]  .  WBC 05/12/2018 7.2  4.0 - 10.5 K/uL Final  . RBC 05/12/2018 5.37  4.22 - 5.81 MIL/uL Final  . Hemoglobin 05/12/2018 15.9  13.0 - 17.0 g/dL Final  . HCT 05/12/2018 49.9  39.0 - 52.0 % Final  . MCV 05/12/2018 92.9  80.0 - 100.0 fL Final  . MCH 05/12/2018 29.6  26.0 - 34.0 pg Final  . MCHC 05/12/2018 31.9  30.0 - 36.0 g/dL Final  . RDW 05/12/2018 14.5  11.5 - 15.5 % Final  . Platelets 05/12/2018 231  150 - 400 K/uL Final  . nRBC 05/12/2018 0.0  0.0 - 0.2 % Final   Performed at [facility], [place] [address] [place], [place] [address]  . Sodium 05/12/2018 140  135 - 145 mmol/L Final  . Potassium 05/12/2018 4.2  3.5 - 5.1 mmol/L Final  . Chloride 05/12/2018 102  98 - 111 mmol/L Final  . CO2 05/12/2018 28  22 - 32  mmol/L Final  . Glucose, Bld 05/12/2018 112* 70 - 99 mg/dL Final  . BUN 05/12/2018 16  8 - 23 mg/dL Final  . Creatinine, Ser 05/12/2018 0.73  0.61 - 1.24 mg/dL Final  . Calcium 05/12/2018 9.1  8.9 - 10.3 mg/dL Final  . Total Protein 05/12/2018 7.2  6.5 - 8.1 g/dL Final  . Albumin 05/12/2018 4.0  3.5 - 5.0 g/dL Final  . AST 05/12/2018 28  15 - 41 U/L Final  . ALT 05/12/2018 25  0 - 44 U/L Final  . Alkaline Phosphatase 05/12/2018 58  38 - 126 U/L Final  . Total Bilirubin 05/12/2018 0.8  0.3 - 1.2 mg/dL Final  . GFR calc non Af Amer 05/12/2018 >60  >60 mL/min Final  . GFR calc Af Amer 05/12/2018 >60  >60 mL/min Final  . Anion gap 05/12/2018 10  5 - 15 Final   Performed at [facility], [place] [address], [place], [place] [address]  . Prothrombin Time 05/12/2018 12.6  11.4 - 15.2 seconds Final  . INR 05/12/2018 0.95   Final   Performed at [place] [address] [place], [place] [address]  . ABO/RH(D) 05/12/2018 A POS   Final  . Antibody Screen 05/12/2018 NEG   Final  . Sample Expiration 05/12/2018 05/18/2018   Final  . Extend sample reason 05/12/2018    Final                   Value:NO TRANSFUSIONS OR PREGNANCY IN THE PAST 3 MONTHS Performed at [facility], [place] [address] [place], [place] [address]   . MRSA, PCR 05/12/2018 NEGATIVE  NEGATIVE Final  . Staphylococcus aureus 05/12/2018 POSITIVE* NEGATIVE Final   Comment: (NOTE) The Xpert SA Assay (FDA approved for NASAL specimens in patients 62 years of age and older), is one component of a comprehensive surveillance program. It is not intended to diagnose infection nor to guide or monitor treatment. Performed at [facility], [place] [address] [place], [place] [address]   . ABO/RH(D) 05/12/2018    Final                   Value:A POS Performed at [facility], [place] [address] [place], [place] [address]      X-Rays:No results  found.  EKG: Orders placed or performed in visit on 03/03/18  . EKG 12-Lead     Hospital Course: [place] Roberson is a 73 y.o. who was admitted to  [facility]. They were brought to the operating room on 05/15/2018 and underwent Procedure(s): TOTAL KNEE ARTHROPLASTY.  Patient tolerated the procedure well and was later transferred to the recovery room and then to the orthopaedic floor for postoperative care. They were given PO and IV analgesics for pain control following their surgery. They were given 24 hours of postoperative antibiotics of  Anti-infectives (From admission, onward)   Start     Dose/Rate Route Frequency Ordered Stop   05/15/18 1330  ceFAZolin (ANCEF) IVPB 2g/100 mL premix     2 g 200 mL/hr over 30 Minutes Intravenous Every 6 hours 05/15/18 1030 05/15/18 2102   05/15/18 0600  ceFAZolin (ANCEF) IVPB 2g/100 mL premix     2 g 200 mL/hr over 30 Minutes Intravenous On call to O.R. 05/15/18 [id number] 05/15/18 0720     and started on DVT prophylaxis in the form of Xarelto.   PT and OT were ordered for total joint protocol. Discharge planning consulted to help with postop disposition and equipment needs.  Patient had a good night on the evening of surgery. They started to get up OOB with therapy on POD #0. Pt was seen during rounds and was ready to go home pending progress with therapy. Hemovac drain was pulled without difficulty. He worked with therapy on POD #1 and was meeting his goals. Pt was discharged to home later that day in stable condition.  Diet: Regular diet Activity: WBAT Follow-up: in 2 weeks Disposition: Home with virtual physical therapy Discharged Condition: stable   Discharge Instructions    Call MD / Call 911   Complete by:  As directed    If you experience chest pain or shortness of breath, CALL 911 and be transported to the hospital emergency room.  If you develope a fever above 101 F, pus (white drainage) or increased drainage or redness at the wound, or  calf pain, call your surgeon's office.   Change dressing   Complete by:  As directed    Change dressing on Wednesday, then change the dressing daily with sterile 4 x 4 inch gauze dressing and apply TED hose.   Constipation Prevention   Complete by:  As directed    Drink plenty of fluids.  Prune juice may be helpful.  You may use a stool softener, such as Colace (over the counter) 100 mg twice a day.  Use MiraLax (over the counter) for constipation as needed.   Diet - low sodium heart healthy   Complete by:  As directed    Discharge instructions   Complete by:  As directed    Dr. [name] Total Joint Specialist [facility] [address], [place], [place] [address] [phone]  TOTAL KNEE REPLACEMENT POSTOPERATIVE DIRECTIONS  Knee Rehabilitation, Guidelines Following Surgery  Results after knee surgery are often greatly improved when you follow the exercise, range of motion and muscle strengthening exercises prescribed by your doctor. Safety measures are also important to protect the knee from further injury. Any time any of these exercises cause you to have increased pain or swelling in your knee joint, decrease the amount until you are comfortable again and slowly increase them. If you have problems or questions, call your caregiver or physical therapist for advice.   HOME CARE INSTRUCTIONS  Remove items at home which could result in a fall. This includes throw rugs or furniture in walking pathways.  ICE to the affected knee every three hours for 30 minutes at a time and  then as needed for pain and swelling.  Continue to use ice on the knee for pain and swelling from surgery. You may notice swelling that will progress down to the foot and ankle.  This is normal after surgery.  Elevate the leg when you are not up walking on it.   Continue to use the breathing machine which will help keep your temperature down.  It is common for your temperature to cycle up and down  following surgery, especially at night when you are not up moving around and exerting yourself.  The breathing machine keeps your lungs expanded and your temperature down. Do not place pillow under knee, focus on keeping the knee straight while resting   DIET You may resume your previous home diet once your are discharged from the hospital.  DRESSING / WOUND CARE / SHOWERING You may shower 3 days after surgery, but keep the wounds dry during showering.  You may use an occlusive plastic wrap (Press'n Seal for example), NO SOAKING/SUBMERGING IN THE BATHTUB.  If the bandage gets wet, change with a clean dry gauze.  If the incision gets wet, pat the wound dry with a clean towel. You may start showering once you are discharged home but do not submerge the incision under water. Just pat the incision dry and apply a dry gauze dressing on daily. Change the surgical dressing daily and reapply a dry dressing each time.  ACTIVITY Walk with your walker as instructed. Use walker as long as suggested by your caregivers. Avoid periods of inactivity such as sitting longer than an hour when not asleep. This helps prevent blood clots.  You may resume a sexual relationship in one month or when given the OK by your doctor.  You may return to work once you are cleared by your doctor.  Do not drive a car for 6 weeks or until released by you surgeon.  Do not drive while taking narcotics.  WEIGHT BEARING Weight bearing as tolerated with assist device (walker, cane, etc) as directed, use it as long as suggested by your surgeon or therapist, typically at least 4-6 weeks.  POSTOPERATIVE CONSTIPATION PROTOCOL Constipation - defined medically as fewer than three stools per week and severe constipation as less than one stool per week.  One of the most common issues patients have following surgery is constipation.  Even if you have a regular bowel pattern at home, your normal regimen is likely to be disrupted due to  multiple reasons following surgery.  Combination of anesthesia, postoperative narcotics, change in appetite and fluid intake all can affect your bowels.  In order to avoid complications following surgery, here are some recommendations in order to help you during your recovery period.  Colace (docusate) - Pick up an over-the-counter form of Colace or another stool softener and take twice a day as long as you are requiring postoperative pain medications.  Take with a full glass of water daily.  If you experience loose stools or diarrhea, hold the colace until you stool forms back up.  If your symptoms do not get better within 1 week or if they get worse, check with your doctor.  Dulcolax (bisacodyl) - Pick up over-the-counter and take as directed by the product packaging as needed to assist with the movement of your bowels.  Take with a full glass of water.  Use this product as needed if not relieved by Colace only.   MiraLax (polyethylene glycol) - Pick up over-the-counter to have on hand.  MiraLax is a solution that will increase the amount of water in your bowels to assist with bowel movements.  Take as directed and can mix with a glass of water, juice, soda, coffee, or tea.  Take if you go more than two days without a movement. Do not use MiraLax more than once per day. Call your doctor if you are still constipated or irregular after using this medication for 7 days in a row.  If you continue to have problems with postoperative constipation, please contact the office for further assistance and recommendations.  If you experience "the worst abdominal pain ever" or develop nausea or vomiting, please contact the office immediatly for further recommendations for treatment.  ITCHING  If you experience itching with your medications, try taking only a single pain pill, or even half a pain pill at a time.  You can also use Benadryl over the counter for itching or also to help with sleep.   TED HOSE  STOCKINGS Wear the elastic stockings on both legs for three weeks following surgery during the day but you may remove then at night for sleeping.  MEDICATIONS See your medication summary on the "After Visit Summary" that the nursing staff will review with you prior to discharge.  You may have some home medications which will be placed on hold until you complete the course of blood thinner medication.  It is important for you to complete the blood thinner medication as prescribed by your surgeon.  Continue your approved medications as instructed at time of discharge.  PRECAUTIONS If you experience chest pain or shortness of breath - call 911 immediately for transfer to the hospital emergency department.  If you develop a fever greater that 101 F, purulent drainage from wound, increased redness or drainage from wound, foul odor from the wound/dressing, or calf pain - CONTACT YOUR SURGEON.                                                   FOLLOW-UP APPOINTMENTS Make sure you keep all of your appointments after your operation with your surgeon and caregivers. You should call the office at the above phone number and make an appointment for approximately two weeks after the date of your surgery or on the date instructed by your surgeon outlined in the "After Visit Summary".   RANGE OF MOTION AND STRENGTHENING EXERCISES  Rehabilitation of the knee is important following a knee injury or an operation. After just a few days of immobilization, the muscles of the thigh which control the knee become weakened and shrink (atrophy). Knee exercises are designed to build up the tone and strength of the thigh muscles and to improve knee motion. Often times heat used for twenty to thirty minutes before working out will loosen up your tissues and help with improving the range of motion but do not use heat for the first two weeks following surgery. These exercises can be done on a training (exercise) mat, on the floor, on  a table or on a bed. Use what ever works the best and is most comfortable for you Knee exercises include:  Leg Lifts - While your knee is still immobilized in a splint or cast, you can do straight leg raises. Lift the leg to 60 degrees, hold for 3 sec, and slowly lower the leg. Repeat 10-20 times  2-3 times daily. Perform this exercise against resistance later as your knee gets better.  Quad and Hamstring Sets - Tighten up the muscle on the front of the thigh (Quad) and hold for 5-10 sec. Repeat this 10-20 times hourly. Hamstring sets are done by pushing the foot backward against an object and holding for 5-10 sec. Repeat as with quad sets.  Leg Slides: Lying on your back, slowly slide your foot toward your buttocks, bending your knee up off the floor (only go as far as is comfortable). Then slowly slide your foot back down until your leg is flat on the floor again. Angel Wings: Lying on your back spread your legs to the side as far apart as you can without causing discomfort.  A rehabilitation program following serious knee injuries can speed recovery and prevent re-injury in the future due to weakened muscles. Contact your doctor or a physical therapist for more information on knee rehabilitation.   IF YOU ARE TRANSFERRED TO A SKILLED REHAB FACILITY If the patient is transferred to a skilled rehab facility following release from the hospital, a list of the current medications will be sent to the facility for the patient to continue.  When discharged from the skilled rehab facility, please have the facility set up the patient's [place] prior to being released. Also, the skilled facility will be responsible for providing the patient with their medications at time of release from the facility to include their pain medication, the muscle relaxants, and their blood thinner medication. If the patient is still at the rehab facility at time of the two week follow up appointment, the skilled  rehab facility will also need to assist the patient in arranging follow up appointment in our office and any transportation needs.  MAKE SURE YOU:  Understand these instructions.  Get help right away if you are not doing well or get worse.    Pick up stool softner and laxative for home use following surgery while on pain medications. Do not submerge incision under water. Please use good hand washing techniques while changing dressing each day. May shower starting three days after surgery. Please use a clean towel to pat the incision dry following showers. Continue to use ice for pain and swelling after surgery. Do not use any lotions or creams on the incision until instructed by your surgeon.   Do not put a pillow under the knee. Place it under the heel.   Complete by:  As directed    Driving restrictions   Complete by:  As directed    No driving for two weeks   TED hose   Complete by:  As directed    Use stockings (TED hose) for three weeks on both leg(s).  You may remove them at night for sleeping.   Weight bearing as tolerated   Complete by:  As directed      Allergies as of 05/16/2018      Reactions   Penicillins    Did it involve swelling of the face/tongue/throat, SOB, or low BP? Unknown Did it involve sudden or severe rash/hives, skin peeling, or any reaction on the inside of your mouth or nose? Unknown Did you need to seek medical attention at a hospital or doctor's office? No When did it last happen?Childhood allergy If all above answers are "NO", may proceed with cephalosporin use.      Medication List    STOP taking these medications   aspirin EC 81 MG tablet  multivitamin with minerals tablet   naproxen sodium 220 MG tablet Commonly known as:  ALEVE   TRIPLE FLEX PO   vitamin C 1000 MG tablet     TAKE these medications   albuterol 108 (90 Base) MCG/ACT inhaler Commonly known as:  PROVENTIL HFA;VENTOLIN HFA Inhale 2 puffs into the lungs every 6  (six) hours as needed for wheezing or shortness of breath.   fluocinonide 0.05 % external solution Commonly known as:  LIDEX Apply 1 application topically daily as needed (rash).   fluticasone 50 MCG/ACT nasal spray Commonly known as:  FLONASE Place 2 sprays into both nostrils daily as needed for allergies or rhinitis.   gabapentin 300 MG capsule Commonly known as:  NEURONTIN Take 1 capsule (300 mg total) by mouth 3 (three) times daily. Take a 300 mg capsule three times a day for two weeks following surgery.Then take a 300 mg capsule two times a day for two weeks. Then take a 300 mg capsule once a day for two weeks. Then discontinue.   hydrochlorothiazide 12.5 MG capsule Commonly known as:  MICROZIDE Take 1 capsule (12.5 mg total) by mouth daily.   LORazepam 0.5 MG tablet Commonly known as:  ATIVAN Take 0.5 mg by mouth daily as needed for sleep.   losartan 50 MG tablet Commonly known as:  COZAAR Take 1 tablet (50 mg total) by mouth daily.   methocarbamol 500 MG tablet Commonly known as:  ROBAXIN Take 1 tablet (500 mg total) by mouth every 6 (six) hours as needed for muscle spasms.   oxyCODONE 5 MG immediate release tablet Commonly known as:  Oxy IR/ROXICODONE Take 1-2 tablets (5-10 mg total) by mouth every 6 (six) hours as needed for severe pain.   rivaroxaban 10 MG Tabs tablet Commonly known as:  XARELTO Take 1 tablet (10 mg total) by mouth daily with breakfast for 20 days. Then resume one 81 mg aspirin once a day.   traMADol 50 MG tablet Commonly known as:  ULTRAM Take 1-2 tablets (50-100 mg total) by mouth every 6 (six) hours as needed for moderate pain.            Discharge Care Instructions  (From admission, onward)         Start     Ordered   05/16/18 0000  Weight bearing as tolerated     05/16/18 0721   05/16/18 0000  Change dressing    Comments:  Change dressing on Wednesday, then change the dressing daily with sterile 4 x 4 inch gauze dressing and  apply TED hose.   05/16/18 1638         Follow-up Information    [name], MD. Go on 05/30/2018.   Specialty:  Orthopedic Surgery Why:  You are scheduled for a post-operative appointment on 05-30-2018 at 1:30 pm. Contact information: [address] [place] [address] [phone]           Signed: Theresa Duty, PA-C Orthopedic Surgery 05/17/2018, 9:27 AM

## 2018-06-20 DIAGNOSIS — Z471 Aftercare following joint replacement surgery: Secondary | ICD-10-CM | POA: Diagnosis not present

## 2018-06-20 DIAGNOSIS — Z96651 Presence of right artificial knee joint: Secondary | ICD-10-CM | POA: Diagnosis not present

## 2018-09-01 DIAGNOSIS — C61 Malignant neoplasm of prostate: Secondary | ICD-10-CM | POA: Diagnosis not present

## 2018-09-04 DIAGNOSIS — H40013 Open angle with borderline findings, low risk, bilateral: Secondary | ICD-10-CM | POA: Diagnosis not present

## 2018-09-04 DIAGNOSIS — H43393 Other vitreous opacities, bilateral: Secondary | ICD-10-CM | POA: Diagnosis not present

## 2018-09-04 DIAGNOSIS — H35033 Hypertensive retinopathy, bilateral: Secondary | ICD-10-CM | POA: Diagnosis not present

## 2018-09-04 DIAGNOSIS — H35013 Changes in retinal vascular appearance, bilateral: Secondary | ICD-10-CM | POA: Diagnosis not present

## 2018-09-11 DIAGNOSIS — I10 Essential (primary) hypertension: Secondary | ICD-10-CM | POA: Diagnosis not present

## 2018-09-11 DIAGNOSIS — Z86010 Personal history of colonic polyps: Secondary | ICD-10-CM | POA: Diagnosis not present

## 2018-09-11 DIAGNOSIS — G4700 Insomnia, unspecified: Secondary | ICD-10-CM | POA: Diagnosis not present

## 2018-09-14 DIAGNOSIS — R351 Nocturia: Secondary | ICD-10-CM | POA: Diagnosis not present

## 2018-09-14 DIAGNOSIS — C61 Malignant neoplasm of prostate: Secondary | ICD-10-CM | POA: Diagnosis not present

## 2018-09-15 DIAGNOSIS — M25551 Pain in right hip: Secondary | ICD-10-CM | POA: Diagnosis not present

## 2018-12-11 DIAGNOSIS — D124 Benign neoplasm of descending colon: Secondary | ICD-10-CM | POA: Diagnosis not present

## 2018-12-11 DIAGNOSIS — D123 Benign neoplasm of transverse colon: Secondary | ICD-10-CM | POA: Diagnosis not present

## 2018-12-11 DIAGNOSIS — K5730 Diverticulosis of large intestine without perforation or abscess without bleeding: Secondary | ICD-10-CM | POA: Diagnosis not present

## 2018-12-11 DIAGNOSIS — K635 Polyp of colon: Secondary | ICD-10-CM | POA: Diagnosis not present

## 2018-12-11 DIAGNOSIS — D125 Benign neoplasm of sigmoid colon: Secondary | ICD-10-CM | POA: Diagnosis not present

## 2018-12-11 DIAGNOSIS — Z86010 Personal history of colonic polyps: Secondary | ICD-10-CM | POA: Diagnosis not present

## 2018-12-11 DIAGNOSIS — D122 Benign neoplasm of ascending colon: Secondary | ICD-10-CM | POA: Diagnosis not present

## 2018-12-11 DIAGNOSIS — Z800 Family history of malignant neoplasm of digestive organs: Secondary | ICD-10-CM | POA: Diagnosis not present

## 2018-12-11 DIAGNOSIS — D128 Benign neoplasm of rectum: Secondary | ICD-10-CM | POA: Diagnosis not present

## 2018-12-13 DIAGNOSIS — K635 Polyp of colon: Secondary | ICD-10-CM | POA: Diagnosis not present

## 2018-12-13 DIAGNOSIS — D125 Benign neoplasm of sigmoid colon: Secondary | ICD-10-CM | POA: Diagnosis not present

## 2018-12-13 DIAGNOSIS — D122 Benign neoplasm of ascending colon: Secondary | ICD-10-CM | POA: Diagnosis not present

## 2018-12-13 DIAGNOSIS — D128 Benign neoplasm of rectum: Secondary | ICD-10-CM | POA: Diagnosis not present

## 2018-12-13 DIAGNOSIS — D124 Benign neoplasm of descending colon: Secondary | ICD-10-CM | POA: Diagnosis not present

## 2018-12-13 DIAGNOSIS — D123 Benign neoplasm of transverse colon: Secondary | ICD-10-CM | POA: Diagnosis not present

## 2018-12-23 DIAGNOSIS — Z23 Encounter for immunization: Secondary | ICD-10-CM | POA: Diagnosis not present

## 2019-01-26 DIAGNOSIS — M25511 Pain in right shoulder: Secondary | ICD-10-CM | POA: Diagnosis not present

## 2019-02-14 DIAGNOSIS — L821 Other seborrheic keratosis: Secondary | ICD-10-CM | POA: Diagnosis not present

## 2019-02-14 DIAGNOSIS — D1801 Hemangioma of skin and subcutaneous tissue: Secondary | ICD-10-CM | POA: Diagnosis not present

## 2019-02-14 DIAGNOSIS — L570 Actinic keratosis: Secondary | ICD-10-CM | POA: Diagnosis not present

## 2019-02-14 DIAGNOSIS — L812 Freckles: Secondary | ICD-10-CM | POA: Diagnosis not present

## 2019-03-05 DIAGNOSIS — H04123 Dry eye syndrome of bilateral lacrimal glands: Secondary | ICD-10-CM | POA: Diagnosis not present

## 2019-03-05 DIAGNOSIS — H40013 Open angle with borderline findings, low risk, bilateral: Secondary | ICD-10-CM | POA: Diagnosis not present

## 2019-04-07 ENCOUNTER — Ambulatory Visit: Payer: Medicare Other | Attending: Internal Medicine

## 2019-04-07 DIAGNOSIS — Z23 Encounter for immunization: Secondary | ICD-10-CM | POA: Diagnosis not present

## 2019-04-07 NOTE — Progress Notes (Signed)
   Covid-19 Vaccination Clinic  Name:  [place] Roberson    MRN: [id number] DOB: [birth year]  04/07/2019  Mr. [place] Roberson was observed post Covid-19 immunization for 15 minutes without incidence. He was provided with Vaccine Information Sheet and instruction to access the V-Safe system.   Mr. [place] Roberson was instructed to call 911 with any severe reactions post vaccine: [name] Difficulty breathing  . Swelling of your face and throat  . A fast heartbeat  . A bad rash all over your body  . Dizziness and weakness    Immunizations Administered    Name Date Dose VIS Date Route   Pfizer COVID-19 Vaccine 04/07/2019  2:14 PM 0.3 mL 03/09/2019 Intramuscular   Manufacturer: [employer]   Lot: [id number]   [place]: [id number]

## 2019-04-12 DIAGNOSIS — Z1389 Encounter for screening for other disorder: Secondary | ICD-10-CM | POA: Diagnosis not present

## 2019-04-12 DIAGNOSIS — I10 Essential (primary) hypertension: Secondary | ICD-10-CM | POA: Diagnosis not present

## 2019-04-12 DIAGNOSIS — Z8546 Personal history of malignant neoplasm of prostate: Secondary | ICD-10-CM | POA: Diagnosis not present

## 2019-04-12 DIAGNOSIS — Z86010 Personal history of colonic polyps: Secondary | ICD-10-CM | POA: Diagnosis not present

## 2019-04-12 DIAGNOSIS — Z0000 Encounter for general adult medical examination without abnormal findings: Secondary | ICD-10-CM | POA: Diagnosis not present

## 2019-04-28 ENCOUNTER — Ambulatory Visit: Payer: Medicare Other | Attending: Internal Medicine

## 2019-04-28 ENCOUNTER — Ambulatory Visit: Payer: Medicare Other

## 2019-04-28 DIAGNOSIS — Z23 Encounter for immunization: Secondary | ICD-10-CM | POA: Insufficient documentation

## 2019-04-28 NOTE — Progress Notes (Signed)
   Covid-19 Vaccination Clinic  Name:  MONTE NORDHOFF    MRN: [id number] DOB: Jan 29, 1946  04/28/2019  Mr. Strosnider was observed post Covid-19 immunization for 15 minutes without incidence. He was provided with Vaccine Information Sheet and instruction to access the V-Safe system.   Mr. Dostal was instructed to call 911 with any severe reactions post vaccine: [name] Difficulty breathing  . Swelling of your face and throat  . A fast heartbeat  . A bad rash all over your body  . Dizziness and weakness    Immunizations Administered    Name Date Dose VIS Date Route   Pfizer COVID-19 Vaccine 04/28/2019 11:37 AM 0.3 mL 03/09/2019 Intramuscular   Manufacturer: [place]   Lot: [id number]   [place]: [id number]

## 2019-05-17 DIAGNOSIS — Z96651 Presence of right artificial knee joint: Secondary | ICD-10-CM | POA: Diagnosis not present

## 2019-05-17 DIAGNOSIS — Z471 Aftercare following joint replacement surgery: Secondary | ICD-10-CM | POA: Diagnosis not present

## 2019-05-24 DIAGNOSIS — M545 Low back pain: Secondary | ICD-10-CM | POA: Diagnosis not present

## 2019-05-24 DIAGNOSIS — M5416 Radiculopathy, lumbar region: Secondary | ICD-10-CM | POA: Diagnosis not present

## 2019-05-24 DIAGNOSIS — M25551 Pain in right hip: Secondary | ICD-10-CM | POA: Diagnosis not present

## 2019-05-28 ENCOUNTER — Other Ambulatory Visit: Payer: Self-pay | Admitting: Student

## 2019-05-28 DIAGNOSIS — M545 Low back pain, unspecified: Secondary | ICD-10-CM

## 2019-06-11 DIAGNOSIS — H26493 Other secondary cataract, bilateral: Secondary | ICD-10-CM | POA: Diagnosis not present

## 2019-06-11 DIAGNOSIS — H26492 Other secondary cataract, left eye: Secondary | ICD-10-CM | POA: Diagnosis not present

## 2019-06-11 DIAGNOSIS — H35033 Hypertensive retinopathy, bilateral: Secondary | ICD-10-CM | POA: Diagnosis not present

## 2019-06-11 DIAGNOSIS — H40013 Open angle with borderline findings, low risk, bilateral: Secondary | ICD-10-CM | POA: Diagnosis not present

## 2019-06-11 DIAGNOSIS — H35013 Changes in retinal vascular appearance, bilateral: Secondary | ICD-10-CM | POA: Diagnosis not present

## 2019-06-25 ENCOUNTER — Other Ambulatory Visit: Payer: Self-pay

## 2019-06-25 ENCOUNTER — Ambulatory Visit
Admission: RE | Admit: 2019-06-25 | Discharge: 2019-06-25 | Disposition: A | Discharge status: Home / Self Care | Payer: Medicare Other | Source: Ambulatory Visit | Attending: Student | Admitting: Student

## 2019-06-25 DIAGNOSIS — M545 Low back pain, unspecified: Secondary | ICD-10-CM

## 2019-06-25 DIAGNOSIS — M48061 Spinal stenosis, lumbar region without neurogenic claudication: Secondary | ICD-10-CM | POA: Diagnosis not present

## 2019-07-03 DIAGNOSIS — M4180 Other forms of scoliosis, site unspecified: Secondary | ICD-10-CM | POA: Diagnosis not present

## 2019-07-03 DIAGNOSIS — M48061 Spinal stenosis, lumbar region without neurogenic claudication: Secondary | ICD-10-CM | POA: Diagnosis not present

## 2019-07-03 DIAGNOSIS — M545 Low back pain: Secondary | ICD-10-CM | POA: Diagnosis not present

## 2019-07-13 DIAGNOSIS — M545 Low back pain: Secondary | ICD-10-CM | POA: Diagnosis not present

## 2019-07-18 DIAGNOSIS — M545 Low back pain: Secondary | ICD-10-CM | POA: Diagnosis not present

## 2019-07-25 DIAGNOSIS — M545 Low back pain: Secondary | ICD-10-CM | POA: Diagnosis not present

## 2019-09-11 DIAGNOSIS — C61 Malignant neoplasm of prostate: Secondary | ICD-10-CM | POA: Diagnosis not present

## 2019-10-08 ENCOUNTER — Ambulatory Visit
Admission: RE | Admit: 2019-10-08 | Discharge: 2019-10-08 | Disposition: A | Discharge status: Home / Self Care | Payer: Medicare Other | Source: Ambulatory Visit | Attending: Physician Assistant | Admitting: Physician Assistant

## 2019-10-08 ENCOUNTER — Other Ambulatory Visit: Payer: Self-pay | Admitting: Physician Assistant

## 2019-10-08 DIAGNOSIS — R509 Fever, unspecified: Secondary | ICD-10-CM | POA: Diagnosis not present

## 2019-10-08 DIAGNOSIS — R61 Generalized hyperhidrosis: Secondary | ICD-10-CM | POA: Diagnosis not present

## 2019-10-08 DIAGNOSIS — Z87891 Personal history of nicotine dependence: Secondary | ICD-10-CM | POA: Diagnosis not present

## 2019-12-17 DIAGNOSIS — H04123 Dry eye syndrome of bilateral lacrimal glands: Secondary | ICD-10-CM | POA: Diagnosis not present

## 2019-12-17 DIAGNOSIS — H40013 Open angle with borderline findings, low risk, bilateral: Secondary | ICD-10-CM | POA: Diagnosis not present

## 2019-12-17 DIAGNOSIS — H35033 Hypertensive retinopathy, bilateral: Secondary | ICD-10-CM | POA: Diagnosis not present

## 2019-12-17 DIAGNOSIS — H26491 Other secondary cataract, right eye: Secondary | ICD-10-CM | POA: Diagnosis not present

## 2020-01-13 DIAGNOSIS — Z23 Encounter for immunization: Secondary | ICD-10-CM | POA: Diagnosis not present

## 2020-02-11 IMAGING — CT CT HEART MORP W/ CTA COR W/ SCORE W/ CA W/CM &/OR W/O CM
4 of 7 series · 8 of 20 positions shown, 9 images · IV contrast (APPLIED)
Comparison: Chest radiograph 12/03/2015.

Addendum:
EXAM:
OVER-READ INTERPRETATION  CT CHEST

The following report is an over-read performed by radiologist Dr.
over-read does not include interpretation of cardiac or coronary
anatomy or pathology. The coronary CTA interpretation by the
cardiologist is attached.
CLINICAL DATA: 72-year-old male with h/o hypertension, FH of early
CAD and fatigue.
Cardiac/Coronary  CT
TECHNIQUE: The patient was scanned on a [employer] Force scanner.

[Series 6: best diast 77 % · axial · 0.39mm/px · z∈[-396,-344]mm · 2 of 392 slices shown, 3 images]
[im 131/392  vessel]
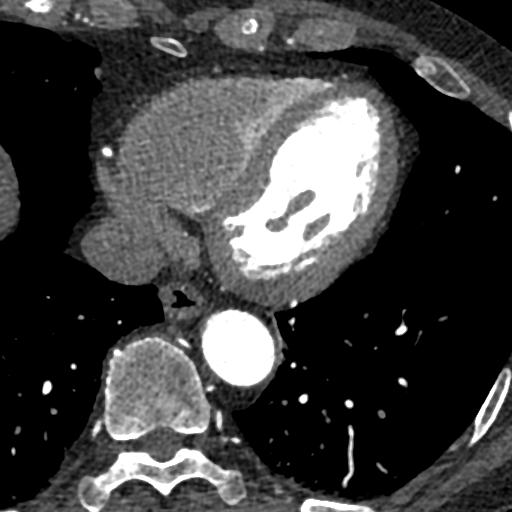
[im 131/392  lung]
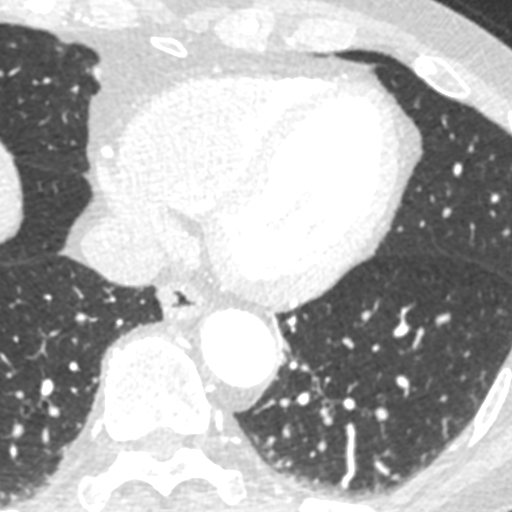
[im 261/392  vessel]
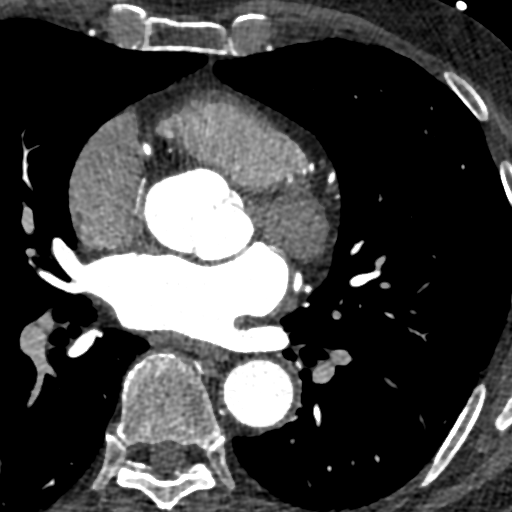

[Series 7: best syst 40 % · axial · 0.39mm/px · z∈[-396,-344]mm · 2 of 392 slices shown]
[im 131/392  vessel]
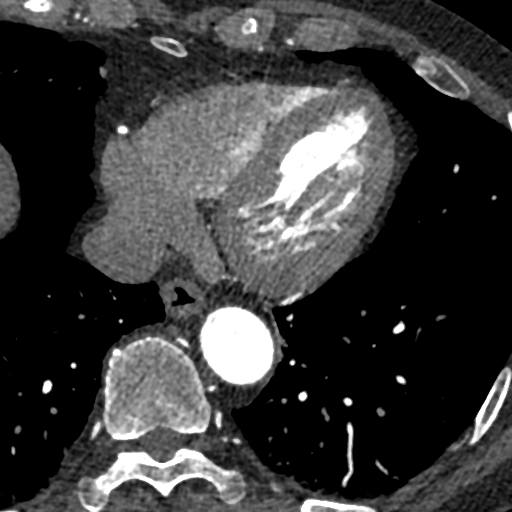
[im 261/392  vessel]
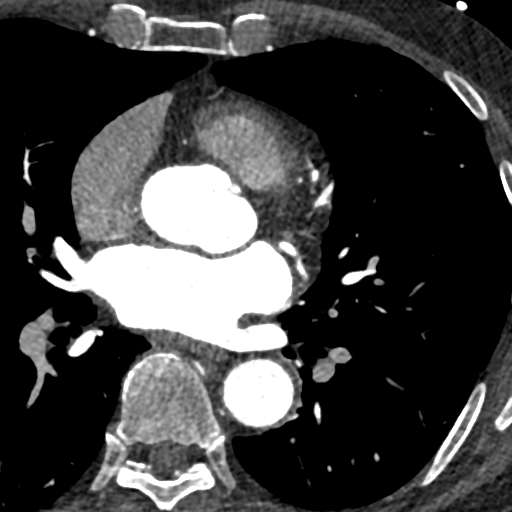

[Series 8: ts diast sharp 77 % · axial · 0.39mm/px · z∈[-396,-344]mm · 2 of 392 slices shown]
[im 131/392  lung]
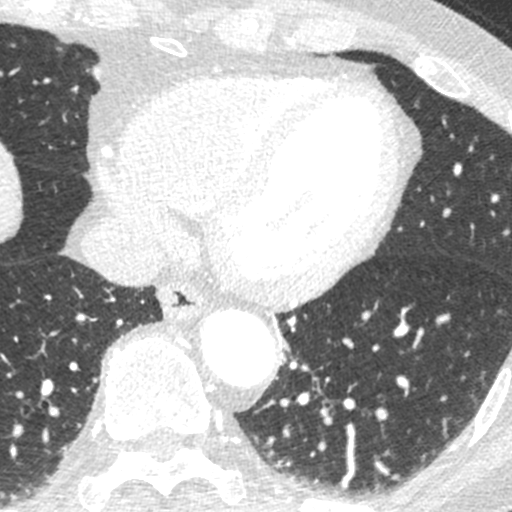
[im 261/392  lung]
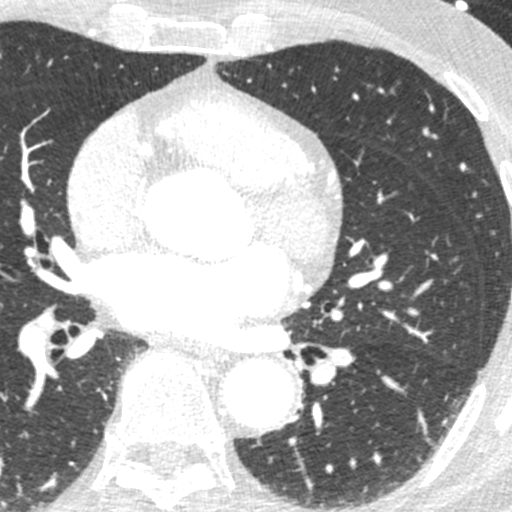

[Series 9: ts syst sharp 40 % · axial · 0.39mm/px · z∈[-396,-344]mm · 2 of 392 slices shown]
[im 131/392  lung]
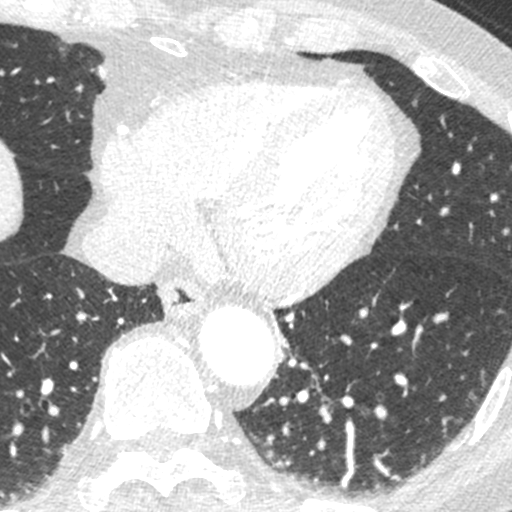
[im 261/392  lung]
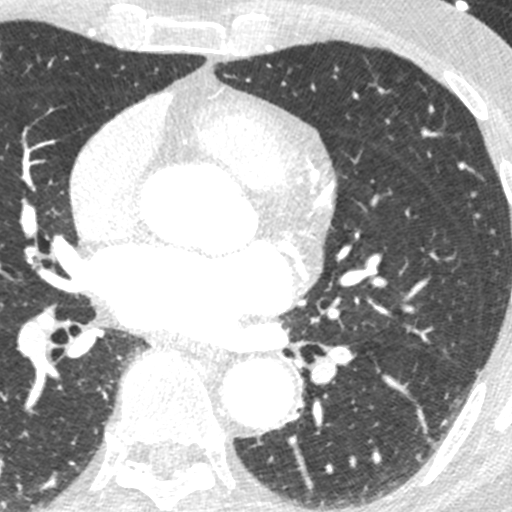

[8 of 20 positions shown; findings below may reference images not displayed]

FINDINGS: Vascular: Tortuous thoracic aorta. Normal aortic caliber. No central
pulmonary embolism, on this non-dedicated study.

Mediastinum/Nodes: No imaged thoracic adenopathy.

Lungs/Pleura: No imaged pleural fluid. Accessory left upper lobe
fissure. Mild centrilobular emphysema.

Upper Abdomen: Tiny low-density liver lesions are likely cysts and
bile duct hamartomas. Normal imaged portions of the spleen, stomach.

Musculoskeletal: No acute osseous abnormality.
IMPRESSION: 1.  No acute findings in the imaged extracardiac chest.
2.  Emphysema ([id number]).
FINDINGS: A 120 kV prospective scan was triggered in the descending thoracic
aorta at 111 HU's. Axial non-contrast 3 mm slices were carried out
through the heart. The data set was analyzed on a dedicated work
station and scored using the Agatson method. Gantry rotation speed
was 250 msecs and collimation was .6 mm. No beta blockade and 0.8 mg
of sl NTG was given. The 3D data set was reconstructed in 5%
intervals of the 67-82 % of the R-R cycle. Diastolic phases were
analyzed on a dedicated work station using MPR, MIP and VRT modes.
The patient received 80 cc of contrast.

Aorta: Normal size. Minimal atheroma, no calcifications. No
dissection.

Aortic Valve:  Trileaflet.  No calcifications.

Coronary Arteries:  Normal coronary origin.  Right dominance.

RCA is a large dominant artery that gives rise to PDA and PLA. There
is no plaque.

Left main is a large artery that gives rise to LAD and LCX arteries.
Left main has no plaque.

LAD is a large vessel that gives rise to one large diagonal artery
and has no plaque.

LCX is a non-dominant artery that gives rise to two large OM
branches. There is no plaque.

Other findings:

Normal pulmonary vein drainage into the left atrium.

Normal let atrial appendage without a thrombus.

Normal size of the pulmonary artery.
IMPRESSION: 1. Coronary calcium score of 0. This was 0 percentile for age and
sex matched control.

2. Normal coronary origin with right dominance.

3. No evidence for CAD.

*** End of Addendum ***

## 2020-02-14 DIAGNOSIS — D1801 Hemangioma of skin and subcutaneous tissue: Secondary | ICD-10-CM | POA: Diagnosis not present

## 2020-02-14 DIAGNOSIS — L812 Freckles: Secondary | ICD-10-CM | POA: Diagnosis not present

## 2020-02-14 DIAGNOSIS — L821 Other seborrheic keratosis: Secondary | ICD-10-CM | POA: Diagnosis not present

## 2020-02-14 DIAGNOSIS — I788 Other diseases of capillaries: Secondary | ICD-10-CM | POA: Diagnosis not present

## 2020-02-14 DIAGNOSIS — L570 Actinic keratosis: Secondary | ICD-10-CM | POA: Diagnosis not present

## 2020-04-15 DIAGNOSIS — Z8546 Personal history of malignant neoplasm of prostate: Secondary | ICD-10-CM | POA: Diagnosis not present

## 2020-04-15 DIAGNOSIS — I499 Cardiac arrhythmia, unspecified: Secondary | ICD-10-CM | POA: Diagnosis not present

## 2020-04-15 DIAGNOSIS — Z1389 Encounter for screening for other disorder: Secondary | ICD-10-CM | POA: Diagnosis not present

## 2020-04-15 DIAGNOSIS — Z0000 Encounter for general adult medical examination without abnormal findings: Secondary | ICD-10-CM | POA: Diagnosis not present

## 2020-04-15 DIAGNOSIS — I10 Essential (primary) hypertension: Secondary | ICD-10-CM | POA: Diagnosis not present

## 2020-04-15 DIAGNOSIS — D72829 Elevated white blood cell count, unspecified: Secondary | ICD-10-CM | POA: Diagnosis not present

## 2020-04-15 DIAGNOSIS — I480 Paroxysmal atrial fibrillation: Secondary | ICD-10-CM | POA: Diagnosis not present

## 2020-04-15 DIAGNOSIS — G4700 Insomnia, unspecified: Secondary | ICD-10-CM | POA: Diagnosis not present

## 2020-04-17 ENCOUNTER — Other Ambulatory Visit: Payer: Self-pay

## 2020-04-17 ENCOUNTER — Ambulatory Visit (INDEPENDENT_AMBULATORY_CARE_PROVIDER_SITE_OTHER): Payer: Medicare Other | Admitting: Cardiovascular Disease

## 2020-04-17 ENCOUNTER — Encounter: Payer: Self-pay | Admitting: Cardiovascular Disease

## 2020-04-17 VITALS — BP 110/82 | HR 77 | Ht 70.5 in | Wt 191.8 lb

## 2020-04-17 DIAGNOSIS — I480 Paroxysmal atrial fibrillation: Secondary | ICD-10-CM

## 2020-04-17 DIAGNOSIS — I4891 Unspecified atrial fibrillation: Secondary | ICD-10-CM | POA: Diagnosis not present

## 2020-04-17 DIAGNOSIS — I10 Essential (primary) hypertension: Secondary | ICD-10-CM | POA: Diagnosis not present

## 2020-04-17 DIAGNOSIS — D72829 Elevated white blood cell count, unspecified: Secondary | ICD-10-CM | POA: Diagnosis not present

## 2020-04-17 NOTE — Patient Instructions (Addendum)
Medication Instructions:  Your physician recommends that you continue on your current medications as directed. Please refer to the Current Medication list given to you today.  *If you need a refill on your cardiac medications before your next appointment, please call your pharmacy*   Lab Work: None If you have labs (blood work) drawn today and your tests are completely normal, you will receive your results only by: [name] MyChart Message (if you have MyChart) OR . A paper copy in the mail If you have any lab test that is abnormal or we need to change your treatment, we will call you to review the results.   Testing/Procedures: Your physician has requested that you have an echocardiogram. Echocardiography is a painless test that uses sound waves to create images of your heart. It provides your doctor with information about the size and shape of your heart and how well your heart's chambers and valves are working. This procedure takes approximately one hour. There are no restrictions for this procedure.   Follow-Up:  Your physician recommends that you schedule a follow-up appointment next available with our Pharmacist team to discuss anticoagulants.    At [facility], you and your health needs are our priority.  As part of our continuing mission to provide you with exceptional heart care, we have created designated Provider Care Teams.  These Care Teams include your primary Cardiologist (physician) and Advanced Practice Providers (APPs -  Physician Assistants and Nurse Practitioners) who all work together to provide you with the care you need, when you need it.  We recommend signing up for the patient portal called "MyChart".  Sign up information is provided on this After Visit Summary.  MyChart is used to connect with patients for Virtual Visits (Telemedicine).  Patients are able to view lab/test results, encounter notes, upcoming appointments, etc.  Non-urgent messages can be sent to your  provider as well.   To learn more about what you can do with MyChart, go to [email or web address].    Your next appointment:   2-3 month(s)  The format for your next appointment:   In Person  Provider:   You may see [name], MD or one of the following Advanced Practice Providers on your designated Care Team:    Cecilie Kicks, NP  Kathyrn Drown, NP    Other Instructions    Kardia monitor by [facility]

## 2020-04-17 NOTE — Progress Notes (Signed)
Cardiology Office Note:    Date:  04/17/2020   ID:  Wayne Roberson, DOB 01/15/1946, MRN [id number]  PCP:  [name], MD  [facility] Cardiologist:  [name], MD  [place] Electrophysiologist:  None   Referring MD: [name], MD   Chief Complaint  Patient presents with  . Shortness of Breath     Jan. 20, 2022:    Wayne Roberson is a 75 y.o. male with a hx of dyspnea.   Hx of HTN.   Seen as a work in visit for his atrial fib by Dr. [name].    He saw Dr. Marlou Porch in 2019.   Has mild COPD. Had a  GXT which was normal Echo : Normal left ventricular systolic function. He has grade 1 diastolic dysfunction. Trivial mitral regurgitation. Coronary CT angio revealed a coronary calcium score of 0. He had no evidence for coronary artery disease.  He presented to [name] office several days ago for his routine yearly physical. He was incidentally found to have rapid atrial fibrillation. He was started on Eliquis at that time. He returned to see [name] today and he was found to have atrial flutter at a rate of 146. Has rare episodes of orthostatic hypotension .   Works hard in his yard.  CHA2DS2-VASc score is 3 (age and hypertension).    Past Medical History:  Diagnosis Date  . Arthritis    knees  . COPD (chronic obstructive pulmonary disease) (HCC)    NEWLY DX , NOW RX'D INHALER PRN , NOT HAD TO USE YET   . Deep vein thrombosis (DVT) of calf muscle vein (HCC) 3 YEARS AGO   LEFT CALF , UNSURE HOW IT WAS TREATED   . Dyspnea    exercise  . Hypertension   . Prostate cancer ([place])   . Tinnitus   . Trace cataracts     Past Surgical History:  Procedure Laterality Date  . CATARACT EXTRACTION, BILATERAL  2018  . PROSTATE BIOPSY    . RADIOACTIVE SEED IMPLANT N/A 03/01/2014   Procedure: RADIOACTIVE SEED IMPLANT;  Surgeon: [name], MD;  Location: [facility];  Service: Urology;  Laterality: N/A;  . TOTAL KNEE ARTHROPLASTY Right  05/15/2018   Procedure: TOTAL KNEE ARTHROPLASTY;  Surgeon: [name], MD;  Location: [facility];  Service: Orthopedics;  Laterality: Right;  67min  . VENTRAL HERNIA REPAIR      Current Medications: Current Meds  Medication Sig  . albuterol (PROVENTIL HFA;VENTOLIN HFA) 108 (90 Base) MCG/ACT inhaler Inhale 2 puffs into the lungs every 6 (six) hours as needed for wheezing or shortness of breath.  . fluocinonide (LIDEX) 0.05 % external solution Apply 1 application topically daily as needed (rash).   . fluticasone (FLONASE) 50 MCG/ACT nasal spray Place 2 sprays into both nostrils daily as needed for allergies or rhinitis.  [name] LORazepam (ATIVAN) 0.5 MG tablet Take 0.5 mg by mouth daily as needed for sleep.   . metoprolol tartrate (LOPRESSOR) 25 MG tablet Take 25 mg by mouth 2 (two) times daily.  [name] telmisartan-hydrochlorothiazide (MICARDIS HCT) 80-12.5 MG tablet Take 1 tablet by mouth daily.  . [DISCONTINUED] gabapentin (NEURONTIN) 300 MG capsule Take 1 capsule (300 mg total) by mouth 3 (three) times daily. Take a 300 mg capsule three times a day for two weeks following surgery.Then take a 300 mg capsule two times a day for two weeks. Then take a 300 mg capsule once a day for two  weeks. Then discontinue.  . [DISCONTINUED] hydrochlorothiazide (MICROZIDE) 12.5 MG capsule Take 1 capsule (12.5 mg total) by mouth daily.  . [DISCONTINUED] losartan (COZAAR) 50 MG tablet Take 1 tablet (50 mg total) by mouth daily.  . [DISCONTINUED] methocarbamol (ROBAXIN) 500 MG tablet Take 1 tablet (500 mg total) by mouth every 6 (six) hours as needed for muscle spasms.  . [DISCONTINUED] oxyCODONE (OXY IR/ROXICODONE) 5 MG immediate release tablet Take 1-2 tablets (5-10 mg total) by mouth every 6 (six) hours as needed for severe pain.  . [DISCONTINUED] traMADol (ULTRAM) 50 MG tablet Take 1-2 tablets (50-100 mg total) by mouth every 6 (six) hours as needed for moderate pain.     Allergies:   Penicillins   Social History    Socioeconomic History  . Marital status: Married    Spouse name: Not on file  . Number of children: Not on file  . Years of education: Not on file  . Highest education level: Not on file  Occupational History  . Not on file  Tobacco Use  . Smoking status: Former Smoker    Packs/day: 2.00    Years: 42.00    Pack years: 84.00    Types: Cigarettes    Quit date: 03/29/2004    Years since quitting: 16.0  . Smokeless tobacco: Never Used  Vaping Use  . Vaping Use: Never used  Substance and Sexual Activity  . Alcohol use: Yes    Alcohol/week: 12.0 standard drinks    Types: 10 Glasses of wine, 2 Standard drinks or equivalent per week    Comment: 2 glasses of wine evening of 05/14/2018  . Drug use: No  . Sexual activity: Never  Other Topics Concern  . Not on file  Social History Narrative  . Not on file   Social Determinants of Health   Financial Resource Strain: Not on file  Food Insecurity: Not on file  Transportation Needs: Not on file  Physical Activity: Not on file  Stress: Not on file  Social Connections: Not on file     Family History: The patient's family history includes Heart failure in his father and mother. There is no history of Cancer.  ROS:   Please see the history of present illness.     All other systems reviewed and are negative.  EKGs/Labs/Other Studies Reviewed:    The following studies were reviewed today:    EKG:    1.  EKG from April 15, 2020 at [name] office reveals atrial fibrillation with a heart rate of 131.    2.  EKG April 17, 2020 at 10:39 AM at [name] office reveals atrial flutter at a rate of 146 3.  April 17, 2020:   At 1351 at [facility] reveals normal sinus rhythm at 77 beats a minute.  No ST or T wave changes.  Recent Labs: No results found for requested labs within last 8760 hours.  Recent Lipid Panel No results found for: CHOL, TRIG, HDL, CHOLHDL, VLDL, LDLCALC, LDLDIRECT   Risk  Assessment/Calculations:    CHA2DS2-VASc Score = 3      :[id number] This indicates a 3.2% annual risk of stroke. The patient's score is based upon:   Age , HTN    CHF History: No HTN History: Yes Diabetes History: No Stroke History: No Vascular Disease History: No Age Score: 2 Gender Score: 0     Physical Exam:    VS:  BP 110/82   Pulse 77   Ht 5'  10.5" (1.791 m)   Wt 191 lb 12.8 oz (87 kg)   SpO2 95%   BMI 27.13 kg/m     Wt Readings from Last 3 Encounters:  04/17/20 191 lb 12.8 oz (87 kg)  05/15/18 190 lb 0.6 oz (86.2 kg)  05/12/18 190 lb (86.2 kg)     GEN:  Well nourished, well developed in no acute distress HEENT: Normal NECK: No JVD; No carotid bruits LYMPHATICS: No lymphadenopathy CARDIAC: RRR, no murmurs, rubs, gallops RESPIRATORY:  Clear to auscultation without rales, wheezing or rhonchi  ABDOMEN: Soft, non-tender, non-distended MUSCULOSKELETAL:  No edema; No deformity  SKIN: Warm and dry NEUROLOGIC:  Alert and oriented x 3 PSYCHIATRIC:  Normal affect   ASSESSMENT:    1. PAF (paroxysmal atrial fibrillation) (HCC)    PLAN:    In order of problems listed above:  1. Paroxysmal atrial fib :    He was in atrial fibrillation with rapid ventricular response when he initially saw [name] several days ago.  [name] saw him back today and he was in atrial flutter at a rate of 146.  Today when I saw him he has already converted back to sinus rhythm.  Is on eliqus  He may want to be on warfarin   We had a long discussion about paroxysmal atrial fibrillation.  He does not have sleep apnea.  He does drink alcohol and may have had a few extra drinks this past weekend.  He does not have any other risk factors that I can see.  We will get an echocardiogram to assess his LV function and valvular function.  I have advised him to drink alcohol only in moderation. He will continue Eliquis.  He is not sure that he wants to stay on Eliquis.  We will have him meet  with our pharmacist to discuss anticoagulation options. We will set him up to see Dr. Marlou Porch in 2 to 3 months for further discussion. He has downloaded the [employer]. And will get the Kardia device to help monitor the Afib.    Medication Adjustments/Labs and Tests Ordered: Current medicines are reviewed at length with the patient today.  Concerns regarding medicines are outlined above.  Orders Placed This Encounter  Procedures  . EKG 12-Lead  . ECHOCARDIOGRAM COMPLETE   No orders of the defined types were placed in this encounter.   Patient Instructions  Kardia monitor by Suezanne Jacquet, Mertie Moores, MD  04/17/2020 2:38 PM    [place]

## 2020-04-24 ENCOUNTER — Other Ambulatory Visit: Payer: Self-pay

## 2020-04-24 ENCOUNTER — Ambulatory Visit (INDEPENDENT_AMBULATORY_CARE_PROVIDER_SITE_OTHER): Payer: Medicare Other | Admitting: Pharmacist

## 2020-04-24 VITALS — BP 138/84 | HR 60

## 2020-04-24 DIAGNOSIS — I480 Paroxysmal atrial fibrillation: Secondary | ICD-10-CM | POA: Diagnosis not present

## 2020-04-24 DIAGNOSIS — I10 Essential (primary) hypertension: Secondary | ICD-10-CM | POA: Diagnosis not present

## 2020-04-24 MED ORDER — TELMISARTAN 80 MG PO TABS: 80.0000 mg | ORAL_TABLET | Freq: Every day | ORAL | 3 refills | Status: DC

## 2020-04-24 MED ORDER — APIXABAN 5 MG PO TABS: 5.0000 mg | ORAL_TABLET | Freq: Two times a day (BID) | ORAL | 0 refills | Status: DC

## 2020-04-24 NOTE — Progress Notes (Signed)
Patient ID: Wayne Roberson                 DOB: 07/27/1945                      MRN: [id number]     HPI: Wayne Roberson is a 75 y.o. male referred by Dr. Acie Fredrickson to pharmacy clinic to discuss anticoagulation. PMH is significant for afib, HTN, dyspnea, and mild COPD. Coronary CT angio revealed a coronary calcium score of 0. Pt first diagnosed with afib with RVR at his PCP office earlier this month. At follow up visit with PCP a few days later, he was in atrial flutter at a rate of 146. Seen by Dr Acie Fredrickson same day on 1/20 and pt had already converted back to sinus rhythm. Pt scheduled for echo and downloaded the [employer]. CHADS2VASc score is 21 (age x2, HTN) = 4.7% annual risk of stroke.   Pt presents today in good spirits with his wife. Long discussion regarding warfarin vs DOACs in regards to safety, efficacy, and monitoring. Pt received Eliquis samples from his PCP, looked up the cost and saw rx would be $600. He has [employer] Part D which unfortunately has a $445 deductible, subsequent copays are $107/3 month supply (cost savings compared to $43/1 month supply). This is the first branded medication he's been prescribed so he was unaware of his deductible. Typically shops around for best insurance pricing.  Also reports feeling a bit fatigued, occasionally dizzy. Checks his BP at home using bicep cuff he's had for about a year. Readings have been 111/65 and 107/66, consistent with reading at last visit with Dr Acie Fredrickson. A bit higher in office today but pt hasn't taken his telmisartan-HCTZ yet today. Uses GoodRx card to make copay ~$18 as otherwise combo pill is $43 on his formulary. Both telmisartan and HCTZ as separate pills are free at a Tier 1 on his plan.  Wt Readings from Last 3 Encounters:  04/17/20 191 lb 12.8 oz (87 kg)  05/15/18 190 lb 0.6 oz (86.2 kg)  05/12/18 190 lb (86.2 kg)   BP Readings from Last 3 Encounters:  04/17/20 110/82  05/16/18 137/78  05/12/18 134/87   Pulse Readings from  Last 3 Encounters:  04/17/20 77  05/16/18 80  05/12/18 80    Renal function: CrCl cannot be calculated (Patient's most recent lab result is older than the maximum 21 days allowed.).  Past Medical History:  Diagnosis Date  . Arthritis    knees  . COPD (chronic obstructive pulmonary disease) (HCC)    NEWLY DX , NOW RX'D INHALER PRN , NOT HAD TO USE YET   . Deep vein thrombosis (DVT) of calf muscle vein (HCC) 3 YEARS AGO   LEFT CALF , UNSURE HOW IT WAS TREATED   . Dyspnea    exercise  . Hypertension   . Prostate cancer ([place])   . Tinnitus   . Trace cataracts     Current Outpatient Medications on File Prior to Visit  Medication Sig Dispense Refill  . albuterol (PROVENTIL HFA;VENTOLIN HFA) 108 (90 Base) MCG/ACT inhaler Inhale 2 puffs into the lungs every 6 (six) hours as needed for wheezing or shortness of breath. 1 Inhaler 6  . fluocinonide (LIDEX) 0.05 % external solution Apply 1 application topically daily as needed (rash).     . fluticasone (FLONASE) 50 MCG/ACT nasal spray Place 2 sprays into both nostrils daily as needed for allergies or rhinitis.    [name]  LORazepam (ATIVAN) 0.5 MG tablet Take 0.5 mg by mouth daily as needed for sleep.     . metoprolol tartrate (LOPRESSOR) 25 MG tablet Take 25 mg by mouth 2 (two) times daily.    [name] telmisartan-hydrochlorothiazide (MICARDIS HCT) 80-12.5 MG tablet Take 1 tablet by mouth daily.     No current facility-administered medications on file prior to visit.    Allergies  Allergen Reactions  . Penicillins     Did it involve swelling of the face/tongue/throat, SOB, or low BP? Unknown Did it involve sudden or severe rash/hives, skin peeling, or any reaction on the inside of your mouth or nose? Unknown Did you need to seek medical attention at a hospital or doctor's office? No When did it last happen?Childhood allergy If all above answers are "NO", may proceed with cephalosporin use.      Assessment/Plan:  1. Anticoagulation - Had  long discussion with pt and his wife comparing safety, efficacy, monitoring, and cost of DOACs vs warfarin, as well as afib disease state overview. He is agreeable to continuing on Eliquis which I am in agreement with given superior efficacy and safety profile over warfarin. Unfortunately he does have a $445 deductible which will need to be met. Provided pt with 1 month of samples to assist, as well as activated 1 month free card to help. At next visit, will change rx to 3 month supply for cost savings.  2. Hypertension - BP has been running low at home with systolic BP 803-212 accompanied by fatigue. Goal BP < 130/1mHg, a bit higher in office today secondary to pt not taking AM dose of telmisartan yet today. Will change telmisartan-HCTZ 80-12.563mto just telmisartan 8026maily (this is also $0 on his formulary compared to the combo pill which was $43 and he was using GoodRx coupon instead), especially since he may see additional BP lowering with recent initiation of metoprolol for rate control for his afib and has already been experiencing symptomatic lows. He will continue to monitor his BP at home. Scheduled f/u in HTN clinic in 1 month and advised pt to bring in home cuff and readings. Did not address diet/exercise today due to lack of time, will address at next visit.  1 hour spent with pt and his wife today.  Alazne Quant E. Jenkins Risdon, PharmD, BCACP, CPPSanta [id number]man[address] [id number]: [phone]: [phone]/2022 7:55 AM

## 2020-04-24 NOTE — Patient Instructions (Addendum)
Stop taking telmisartan-HCTZ 80-12.5mg  and start taking telmisartan 80mg  once daily  Continue taking your other medications  Monitor your blood pressure at home, your goal is < 130/57mmHg  Bring your cuff and readings to your next visit in 4 weeks

## 2020-05-01 DIAGNOSIS — H40013 Open angle with borderline findings, low risk, bilateral: Secondary | ICD-10-CM | POA: Diagnosis not present

## 2020-05-02 ENCOUNTER — Other Ambulatory Visit: Payer: Self-pay

## 2020-05-02 ENCOUNTER — Ambulatory Visit (HOSPITAL_COMMUNITY): Payer: Medicare Other | Attending: Internal Medicine

## 2020-05-02 DIAGNOSIS — I480 Paroxysmal atrial fibrillation: Secondary | ICD-10-CM | POA: Diagnosis not present

## 2020-05-02 LAB — ECHOCARDIOGRAM COMPLETE
Area-P 1/2: 2.48 cm2
S' Lateral: 3.9 cm

## 2020-05-21 NOTE — Patient Instructions (Addendum)
Take your twice daily medications (metoprolol and Eliquis) about 12 hours apart.   Continue to monitor your blood pressure at home, your readings are looking excellent and are at goal < 130/66mmHg. If you notice your pressure increase above this goal consistently, work to cut back on salt and caffeinated coffee in your diet  Call Richard Ritchey, Pharmacist with any concerns about your blood thinner or your blood pressure

## 2020-05-21 NOTE — Progress Notes (Signed)
Patient ID: Wayne Roberson                 DOB: 09-23-1945                      MRN: [id number]     HPI: Wayne Roberson is a 75 y.o. male referred by Dr. Acie Fredrickson to pharmacy clinic who presents today for anticoagulation and HTN follow up. PMH is significant for afib, HTN, dyspnea, and mild COPD. Coronary CT angio revealed a coronary calcium score of 0. Pt first diagnosed with afib with RVR at his PCP office earlier this month. At follow up visit with PCP a few days later, he was in atrial flutter at a rate of 146 and was provided with Eliquis samples and referred to cardiology. Pt was seen by me 1 month ago. Extensive conversation was had with pt and his wife regarding anticoagulant options and pt was agreeable to take Eliquis (does have high deductible of $445). His HCTZ 12.5mg  was also stopped secondary to hypotension accompanied by fatigue at home.  Pt presents today in good spirits with his wife. Reports tolerating his medications well and denies adverse effects. Has been monitoring his BP at home daily at various times throughout the day using a bicep cuff he's had for about 2 years. BP readings have been excellent and all at goal. Tolerating Eliquis well. Has not taken his AM medications yet today. Brought in his clinic cuff - reading was 139/83, close to clinic reading of 142/78.  Current HTN meds: telmisartan 80mg  daily (10am), metoprolol tartrate 25mg  BID (afib) (10am, 8pm)  Previously tried: HCTZ 12.5mg  daily - low BP   BP goal: < 130/2mmHg  Family history: mother and father with CHF  Social history: former smoker 2 PPD, quit in 2006. Drinks alcohol, denies illicit drug use.  Diet: Eats about 2 meals per day. Likes bacon or sausage and eggs, cereal, toast, granola, bagels etc in the AM. Likes [place] about once a week. Dinner is at home - likes chicken, seafood, vegetables, avoids fried food. Does add salt to his food. 2-3 cups of caffeinated coffee each day.  Exercise: 30 minute  walks 2-3 times per week  BP readings: BP calculates average of 3 readings. Checks at various times of the day. Uses home bicep cuff he's had for about 2 years. Readings: 111/66, 131/76, 119/70, 120/69, 132/74, 123/75, 121/68, 111/71, 131/76, 129/72, 105/69 (cuff stated irregular heart beat), 117/75, 117/72, 117/67, 121/71, 129/74, 125/79. HR 66 - 79, he purchased a pulse ox.  Labs: 04/17/20 [facility]): SCr 0.86, K 4.3, Na 139  Wt Readings from Last 3 Encounters:  04/17/20 191 lb 12.8 oz (87 kg)  05/15/18 190 lb 0.6 oz (86.2 kg)  05/12/18 190 lb (86.2 kg)   BP Readings from Last 3 Encounters:  04/24/20 138/84  04/17/20 110/82  05/16/18 137/78   Pulse Readings from Last 3 Encounters:  04/24/20 60  04/17/20 77  05/16/18 80    Renal function: CrCl cannot be calculated (Patient's most recent lab result is older than the maximum 21 days allowed.).  Past Medical History:  Diagnosis Date  . Arthritis    knees  . COPD (chronic obstructive pulmonary disease) (HCC)    NEWLY DX , NOW RX'D INHALER PRN , NOT HAD TO USE YET   . Deep vein thrombosis (DVT) of calf muscle vein (HCC) 3 YEARS AGO   LEFT CALF , UNSURE HOW IT WAS TREATED   .  Dyspnea    exercise  . Hypertension   . Prostate cancer ([place])   . Tinnitus   . Trace cataracts     Current Outpatient Medications on File Prior to Visit  Medication Sig Dispense Refill  . albuterol (PROVENTIL HFA;VENTOLIN HFA) 108 (90 Base) MCG/ACT inhaler Inhale 2 puffs into the lungs every 6 (six) hours as needed for wheezing or shortness of breath. 1 Inhaler 6  . apixaban (ELIQUIS) 5 MG TABS tablet Take 1 tablet (5 mg total) by mouth 2 (two) times daily. 60 tablet 0  . fluocinonide (LIDEX) 0.05 % external solution Apply 1 application topically daily as needed (rash).     . fluticasone (FLONASE) 50 MCG/ACT nasal spray Place 2 sprays into both nostrils daily as needed for allergies or rhinitis.    [name] LORazepam (ATIVAN) 0.5 MG tablet Take 0.5 mg by mouth  daily as needed for sleep.     . metoprolol tartrate (LOPRESSOR) 25 MG tablet Take 25 mg by mouth 2 (two) times daily.    [name] telmisartan (MICARDIS) 80 MG tablet Take 1 tablet (80 mg total) by mouth daily. 90 tablet 3   No current facility-administered medications on file prior to visit.    Allergies  Allergen Reactions  . Penicillins     Did it involve swelling of the face/tongue/throat, SOB, or low BP? Unknown Did it involve sudden or severe rash/hives, skin peeling, or any reaction on the inside of your mouth or nose? Unknown Did you need to seek medical attention at a hospital or doctor's office? No When did it last happen?Childhood allergy If all above answers are "NO", may proceed with cephalosporin use.      Assessment/Plan:  1. Hypertension - BP excellent and controlled at goal <130/49mmHg with all home readings, BP cuff accuracy verified in clinic today. Clinic reading slightly elevated secondary to pt not taking AM BP medication yet today. Will continue telmisartan 80mg  daily and metoprolol tartrate 25mg  BID (for afib). Pt encouraged to limit sodium intake and decrease daily caffeine intake. F/u in HTN clinic as needed.  2. Anticoagulation - Pt tolerating Eliquis well, provided with 2 weeks of samples today as pt has a $450 deductible to meet. Discussed looking for alternative part D plans for next year with lower/no deductible to assist with cost.  Aima Mcwhirt E. Norris Brumbach, PharmD, BCACP, [place] [address] [place], [place] [address] Phone: [phone]; Fax: [phone] 05/21/2020 1:32 PM

## 2020-05-22 ENCOUNTER — Ambulatory Visit (INDEPENDENT_AMBULATORY_CARE_PROVIDER_SITE_OTHER): Payer: Medicare Other | Admitting: Pharmacist

## 2020-05-22 ENCOUNTER — Other Ambulatory Visit: Payer: Self-pay

## 2020-05-22 VITALS — BP 142/78 | HR 63

## 2020-05-22 DIAGNOSIS — I480 Paroxysmal atrial fibrillation: Secondary | ICD-10-CM | POA: Diagnosis not present

## 2020-05-22 DIAGNOSIS — I10 Essential (primary) hypertension: Secondary | ICD-10-CM | POA: Diagnosis not present

## 2020-05-22 MED ORDER — METOPROLOL TARTRATE 25 MG PO TABS: 25.0000 mg | ORAL_TABLET | Freq: Two times a day (BID) | ORAL | 3 refills | Status: DC

## 2020-05-22 MED ORDER — APIXABAN 5 MG PO TABS: 5.0000 mg | ORAL_TABLET | Freq: Two times a day (BID) | ORAL | 1 refills | Status: DC

## 2020-05-22 MED ORDER — TELMISARTAN 80 MG PO TABS: 80.0000 mg | ORAL_TABLET | Freq: Every day | ORAL | 3 refills | Status: DC

## 2020-05-23 ENCOUNTER — Ambulatory Visit: Payer: Medicare Other

## 2020-06-24 ENCOUNTER — Ambulatory Visit (INDEPENDENT_AMBULATORY_CARE_PROVIDER_SITE_OTHER): Payer: Medicare Other

## 2020-06-24 ENCOUNTER — Ambulatory Visit (INDEPENDENT_AMBULATORY_CARE_PROVIDER_SITE_OTHER): Payer: Medicare Other | Admitting: Cardiology

## 2020-06-24 ENCOUNTER — Encounter: Payer: Self-pay | Admitting: *Deleted

## 2020-06-24 ENCOUNTER — Other Ambulatory Visit: Payer: Self-pay

## 2020-06-24 ENCOUNTER — Encounter: Payer: Self-pay | Admitting: Cardiology

## 2020-06-24 VITALS — BP 100/70 | HR 58 | Ht 70.5 in | Wt 190.0 lb

## 2020-06-24 DIAGNOSIS — I480 Paroxysmal atrial fibrillation: Secondary | ICD-10-CM

## 2020-06-24 DIAGNOSIS — I10 Essential (primary) hypertension: Secondary | ICD-10-CM

## 2020-06-24 NOTE — Progress Notes (Signed)
Cardiology Office Note:    Date:  06/24/2020   ID:  Wayne Roberson, DOB [birth year], MRN [id number]  PCP:  [name], [place]  Cardiologist:  [name], MD  Advanced Practice Provider:  No care team member to display Electrophysiologist:  None       Referring MD: [name], MD    History of Present Illness:    Wayne Roberson is a 75 y.o. male relation follow-up.  Hypertension, anticoagulation.  Appreciate pharmacy team assistance.  Blood pressure under good control with current medications.  Continuing telmisartan and metoprolol.  Eliquis.  Cost has been challenging.  Mild COPD  Calcium score 0  ECHO normal EF.   Overall feels well.  He is unsure whether or not he is in atrial fibrillation ever.  No chest pain no shortness of breath.  Past Medical History:  Diagnosis Date  . Arthritis    knees  . COPD (chronic obstructive pulmonary disease) (HCC)    NEWLY DX , NOW RX'D INHALER PRN , NOT HAD TO USE YET   . Deep vein thrombosis (DVT) of calf muscle vein (HCC) 3 YEARS AGO   LEFT CALF , UNSURE HOW IT WAS TREATED   . Dyspnea    exercise  . Hypertension   . Prostate cancer ([place])   . Tinnitus   . Trace cataracts     Past Surgical History:  Procedure Laterality Date  . CATARACT EXTRACTION, BILATERAL  2018  . PROSTATE BIOPSY    . RADIOACTIVE SEED IMPLANT N/A 03/01/2014   Procedure: RADIOACTIVE SEED IMPLANT;  Surgeon: [name], MD;  [facility];  Service: Urology;  Laterality: N/A;  . TOTAL KNEE ARTHROPLASTY Right 05/15/2018   Procedure: TOTAL KNEE ARTHROPLASTY;  Surgeon: [name], MD;  Location: [facility];  Service: Orthopedics;  Laterality: Right;  58min  . VENTRAL HERNIA REPAIR      Current Medications: Current Meds  Medication Sig  . albuterol (PROVENTIL HFA;VENTOLIN HFA) 108 (90 Base) MCG/ACT inhaler Inhale 2 puffs into the lungs every 6 (six) hours as needed for wheezing or shortness of  breath.  [name] apixaban (ELIQUIS) 5 MG TABS tablet Take 1 tablet (5 mg total) by mouth 2 (two) times daily.  . fluocinonide (LIDEX) 0.05 % external solution Apply 1 application topically daily as needed (rash).   . fluticasone (FLONASE) 50 MCG/ACT nasal spray Place 2 sprays into both nostrils daily as needed for allergies or rhinitis.  [name] LORazepam (ATIVAN) 0.5 MG tablet Take 0.5 mg by mouth daily as needed for sleep.   . metoprolol tartrate (LOPRESSOR) 25 MG tablet Take 1 tablet (25 mg total) by mouth 2 (two) times daily.  [name] telmisartan-hydrochlorothiazide (MICARDIS HCT) 80-12.5 MG tablet Take 1 tablet by mouth daily.     Allergies:   Penicillins   Social History   Socioeconomic History  . Marital status: Married    Spouse name: Not on file  . Number of children: Not on file  . Years of education: Not on file  . Highest education level: Not on file  Occupational History  . Not on file  Tobacco Use  . Smoking status: Former Smoker    Packs/day: 2.00    Years: 42.00    Pack years: 84.00    Types: Cigarettes    Quit date: 03/29/2004    Years since quitting: 16.2  . Smokeless tobacco: Never Used  Vaping Use  . Vaping Use: Never used  Substance and Sexual Activity  . Alcohol use: Yes    Alcohol/week: 12.0 standard drinks    Types: 10 Glasses of wine, 2 Standard drinks or equivalent per week    Comment: 2 glasses of wine evening of 05/14/2018  . Drug use: No  . Sexual activity: Never  Other Topics Concern  . Not on file  Social History Narrative  . Not on file   Social Determinants of Health   Financial Resource Strain: Not on file  Food Insecurity: Not on file  Transportation Needs: Not on file  Physical Activity: Not on file  Stress: Not on file  Social Connections: Not on file     Family History: The patient's family history includes Heart failure in his father and mother. There is no history of Cancer.  ROS:   Please see the history of present illness.     All other  systems reviewed and are negative.  EKGs/Labs/Other Studies Reviewed:    The following studies were reviewed today:  ECHO  05/02/20   1. Left ventricular ejection fraction, by estimation, is 60 to 65%. The  left ventricle has normal function. The left ventricle has no regional  wall motion abnormalities. Left ventricular diastolic parameters are  consistent with Grade I diastolic  dysfunction (impaired relaxation). The average left ventricular global  longitudinal strain is -20.1 %. The global longitudinal strain is normal.  2. Right ventricular systolic function is normal. The right ventricular  size is normal.  3. The mitral valve is normal in structure. Trivial mitral valve  regurgitation.  4. The aortic valve is tricuspid. There is mild thickening of the aortic  valve. Aortic valve regurgitation is not visualized. No aortic stenosis is  present.  5. The inferior vena cava is normal in size with greater than 50%  respiratory variability, suggesting right atrial pressure of 3 mmHg.   Comparison(s): A prior study was performed on 08/10/2016. No significant  change from prior study. Prior images reviewed side by side.    Recent Labs: No results found for requested labs within last 8760 hours.  Recent Lipid Panel No results found for: CHOL, TRIG, HDL, CHOLHDL, VLDL, LDLCALC, LDLDIRECT   Risk Assessment/Calculations:      Physical Exam:    VS:  BP 100/70 (BP Location: Left Arm, Patient Position: Sitting, Cuff Size: Normal)   Pulse (!) 58   Ht 5' 10.5" (1.791 m)   Wt 190 lb (86.2 kg)   SpO2 95%   BMI 26.88 kg/m     Wt Readings from Last 3 Encounters:  06/24/20 190 lb (86.2 kg)  04/17/20 191 lb 12.8 oz (87 kg)  05/15/18 190 lb 0.6 oz (86.2 kg)     GEN:  Well nourished, well developed in no acute distress HEENT: Normal NECK: No JVD; No carotid bruits LYMPHATICS: No lymphadenopathy CARDIAC: RRR, no murmurs, rubs, gallops RESPIRATORY:  Clear to auscultation  without rales, wheezing or rhonchi  ABDOMEN: Soft, non-tender, non-distended MUSCULOSKELETAL:  No edema; No deformity  SKIN: Warm and dry NEUROLOGIC:  Alert and oriented x 3 PSYCHIATRIC:  Normal affect   ASSESSMENT:    1. Paroxysmal atrial fibrillation (HCC)   2. Primary hypertension    PLAN:    In order of problems listed above:  PAF  - NSR.  We will check a Zio patch monitor to understand burden of atrial fibrillation. -Low threshold for EP referral to discuss potential ablation.  HTN  -Much better control currently.  Doing well.  Continue with  current medications.  Chronic anticoagulation -Continue with Eliquis.  [employer].     Medication Adjustments/Labs and Tests Ordered: Current medicines are reviewed at length with the patient today.  Concerns regarding medicines are outlined above.  Orders Placed This Encounter  Procedures  . LONG TERM MONITOR (3-14 DAYS)   No orders of the defined types were placed in this encounter.   Patient Instructions  Medication Instructions:  The current medical regimen is effective;  continue present plan and medications.  *If you need a refill on your cardiac medications before your next appointment, please call your pharmacy*  Testing/Procedures: [place] Monitor Instructions   Your physician has requested you wear your ZIO patch monitor 14 days.   This is a single patch monitor.  [employer] supplies one patch monitor per enrollment.  Additional stickers are not available.   Please do not apply patch if you will be having a Nuclear Stress Test, Echocardiogram, Cardiac CT, MRI, or Chest Xray during the time frame you would be wearing the monitor. The patch cannot be worn during these tests.  You cannot remove and re-apply the ZIO XT patch monitor.   Your ZIO patch monitor will be sent USPS Priority mail from [facility] directly to your home address. The monitor may also be mailed to a PO BOX if home delivery is not  available.   It may take 3-5 days to receive your monitor after you have been enrolled.   Once you have received you monitor, please review enclosed instructions.  Your monitor has already been registered assigning a specific monitor serial # to you.   Applying the monitor   Shave hair from upper left chest.   Hold abrader disc by orange tab.  Rub abrader in 40 strokes over left upper chest as indicated in your monitor instructions.   Clean area with 4 enclosed alcohol pads .  Use all pads to assure are is cleaned thoroughly.  Let dry.   Apply patch as indicated in monitor instructions.  Patch will be place under collarbone on left side of chest with arrow pointing upward.   Rub patch adhesive wings for 2 minutes.Remove white label marked "1".  Remove white label marked "2".  Rub patch adhesive wings for 2 additional minutes.   While looking in a mirror, press and release button in center of patch.  A small green light will flash 3-4 times .  This will be your only indicator the monitor has been turned on.     Do not shower for the first 24 hours.  You may shower after the first 24 hours.   Press button if you feel a symptom. You will hear a small click.  Record Date, Time and Symptom in the Patient Log Book.   When you are ready to remove patch, follow instructions on last 2 pages of Patient Log Book.  Stick patch monitor onto last page of Patient Log Book.   Place Patient Log Book in [place] box.  Use locking tab on box and tape box closed securely.  The [employer] and [employer] has [employer] on it.  Please place in mailbox as soon as possible.  Your physician should have your test results approximately 7 days after the monitor has been mailed back to [facility].   Call [place] at [phone] if you have questions regarding your ZIO XT patch monitor.  Call them immediately if you see an orange light blinking on your monitor.  If your monitor falls off in less  than 4 days contact our Monitor department at [phone].  If your monitor becomes loose or falls off after 4 days call [employer] at [phone] for suggestions on securing your monitor.   Follow-Up: At [facility], you and your health needs are our priority.  As part of our continuing mission to provide you with exceptional heart care, we have created designated Provider Care Teams.  These Care Teams include your primary Cardiologist (physician) and Advanced Practice Providers (APPs -  Physician Assistants and Nurse Practitioners) who all work together to provide you with the care you need, when you need it.  We recommend signing up for the patient portal called "MyChart".  Sign up information is provided on this After Visit Summary.  MyChart is used to connect with patients for Virtual Visits (Telemedicine).  Patients are able to view lab/test results, encounter notes, upcoming appointments, etc.  Non-urgent messages can be sent to your provider as well.   To learn more about what you can do with MyChart, go to [email or web address].    Your next appointment:   6 month(s)  The format for your next appointment:   In Person  Provider:   [name], MD   Thank you for choosing [facility]!!         Signed, [name], MD  06/24/2020 4:40 PM    [place]

## 2020-06-24 NOTE — Patient Instructions (Signed)
Medication Instructions:  The current medical regimen is effective;  continue present plan and medications.  *If you need a refill on your cardiac medications before your next appointment, please call your pharmacy*  Testing/Procedures: [place] Monitor Instructions   Your physician has requested you wear your ZIO patch monitor 14 days.   This is a single patch monitor.  [employer] supplies one patch monitor per enrollment.  Additional stickers are not available.   Please do not apply patch if you will be having a Nuclear Stress Test, Echocardiogram, Cardiac CT, MRI, or Chest Xray during the time frame you would be wearing the monitor. The patch cannot be worn during these tests.  You cannot remove and re-apply the ZIO XT patch monitor.   Your ZIO patch monitor will be sent USPS Priority mail from [facility] directly to your home address. The monitor may also be mailed to a PO BOX if home delivery is not available.   It may take 3-5 days to receive your monitor after you have been enrolled.   Once you have received you monitor, please review enclosed instructions.  Your monitor has already been registered assigning a specific monitor serial # to you.   Applying the monitor   Shave hair from upper left chest.   Hold abrader disc by orange tab.  Rub abrader in 40 strokes over left upper chest as indicated in your monitor instructions.   Clean area with 4 enclosed alcohol pads .  Use all pads to assure are is cleaned thoroughly.  Let dry.   Apply patch as indicated in monitor instructions.  Patch will be place under collarbone on left side of chest with arrow pointing upward.   Rub patch adhesive wings for 2 minutes.Remove white label marked "1".  Remove white label marked "2".  Rub patch adhesive wings for 2 additional minutes.   While looking in a mirror, press and release button in center of patch.  A small green light will flash 3-4 times .  This will be your only  indicator the monitor has been turned on.     Do not shower for the first 24 hours.  You may shower after the first 24 hours.   Press button if you feel a symptom. You will hear a small click.  Record Date, Time and Symptom in the Patient Log Book.   When you are ready to remove patch, follow instructions on last 2 pages of Patient Log Book.  Stick patch monitor onto last page of Patient Log Book.   Place Patient Log Book in [place] box.  Use locking tab on box and tape box closed securely.  The [employer] has [employer] on it.  Please place in mailbox as soon as possible.  Your physician should have your test results approximately 7 days after the monitor has been mailed back to [facility].   Call [place] at [phone] if you have questions regarding your ZIO XT patch monitor.  Call them immediately if you see an orange light blinking on your monitor.   If your monitor falls off in less than 4 days contact our Monitor department at [phone].  If your monitor becomes loose or falls off after 4 days call [employer] at [phone] for suggestions on securing your monitor.   Follow-Up: At [facility], you and your health needs are our priority.  As part of our continuing mission to provide you with exceptional heart care, we have  created designated Provider Care Teams.  These Care Teams include your primary Cardiologist (physician) and Advanced Practice Providers (APPs -  Physician Assistants and Nurse Practitioners) who all work together to provide you with the care you need, when you need it.  We recommend signing up for the patient portal called "MyChart".  Sign up information is provided on this After Visit Summary.  MyChart is used to connect with patients for Virtual Visits (Telemedicine).  Patients are able to view lab/test results, encounter notes, upcoming appointments, etc.  Non-urgent messages can be sent to your provider as well.   To learn  more about what you can do with MyChart, go to [email or web address].    Your next appointment:   6 month(s)  The format for your next appointment:   In Person  Provider:   [name], MD   Thank you for choosing [facility]!!

## 2020-06-24 NOTE — Progress Notes (Signed)
Patient ID: Wayne Roberson, male   DOB: [birth year], 75 y.o.   MRN: [id number] Patient enrolled for [employer] to ship a 14 day ZIO XT long term holter monitor to his home.

## 2020-06-27 DIAGNOSIS — I480 Paroxysmal atrial fibrillation: Secondary | ICD-10-CM

## 2020-07-16 DIAGNOSIS — I480 Paroxysmal atrial fibrillation: Secondary | ICD-10-CM | POA: Diagnosis not present

## 2020-08-05 ENCOUNTER — Telehealth: Payer: Self-pay | Admitting: *Deleted

## 2020-08-05 NOTE — Telephone Encounter (Signed)
Pt is scheduled for Thursday 5/12 at 10:20 am.

## 2020-08-05 NOTE — Telephone Encounter (Signed)
Pt sent in a MyChart request with multiple questions.  Dr Marlou Porch suggested he be scheduled to discuss his concerns.  Left message for pt and sent a message to him via MyChart.  Dr Marlou Porch can work him in on Thursday around 10:20 am.

## 2020-08-07 ENCOUNTER — Encounter: Payer: Self-pay | Admitting: *Deleted

## 2020-08-07 ENCOUNTER — Encounter: Payer: Self-pay | Admitting: Cardiology

## 2020-08-07 ENCOUNTER — Other Ambulatory Visit: Payer: Self-pay

## 2020-08-07 ENCOUNTER — Ambulatory Visit (INDEPENDENT_AMBULATORY_CARE_PROVIDER_SITE_OTHER): Payer: Medicare Other | Admitting: Cardiology

## 2020-08-07 VITALS — BP 110/68 | HR 64 | Ht 71.0 in | Wt 192.0 lb

## 2020-08-07 DIAGNOSIS — I10 Essential (primary) hypertension: Secondary | ICD-10-CM | POA: Diagnosis not present

## 2020-08-07 DIAGNOSIS — R079 Chest pain, unspecified: Secondary | ICD-10-CM | POA: Diagnosis not present

## 2020-08-07 DIAGNOSIS — R072 Precordial pain: Secondary | ICD-10-CM

## 2020-08-07 MED ORDER — METOPROLOL TARTRATE 50 MG PO TABS: 50.0000 mg | ORAL_TABLET | Freq: Two times a day (BID) | ORAL | 3 refills | Status: DC

## 2020-08-07 NOTE — Progress Notes (Addendum)
Cardiology Office Note:    Date:  08/07/2020   ID:  Wayne Roberson, DOB March 11, 1946, MRN [id number]  PCP:  [name], MD   [facility] Providers Cardiologist:  [name], MD     Referring MD: [name], MD     History of Present Illness:    Wayne Roberson is a 75 y.o. male here for follow up PAF.    Appreciate pharmacy team assistance.  Blood pressure under good control with current medications.  Continuing telmisartan and metoprolol.  Eliquis.  Cost has been challenging.   Sinus rhythm with average heart rate of 65 bpm  There were symptoms of chest pressure, all associated with benign sinus rhythm  One 4 beats run of consecutive PVC's, non sustained and asymptomatic  41 SVT episodes longest 32 seconds with avg rate of 118 bpm, fastest 5 beats at 210 bpm  Rare PAC's  Occasional PVC's (1.3%)  No atrial fibrillation, no pauses  Min heart rate during sleep, transiently 39 bpm, benign   The results of, and your comments on, the long- term monitor report leaves me with some questions.  Does the "no atrial fibrillation detected" mean I don't have AFIB?  Or does it just mean that it did not happen to occur during the 2 weeks when I wore the heart monitor?  Both you and Dr. Acie Fredrickson concurred that I did have paradoxical AFIB, based on the EKGs which were done prior to my last office visit with you. So, if I don't have AFIB, what do I have?  Some other type of arrythmia?  I still am having heart episodes multiple times per week, when I have detectable pain in my chest, and also some nausea, which sometimes causes me to need to lie down on the bed for a while until the episode subsides. This past Sunday, when I had an episode,  I put the oximeter that I have on my finger to see what was registered.  The wave form it showed for my heartbeat was erratic and wouldn't register my oxygen level or heartbeat.  So, I put on my blood pressure monitor and it registered 105/74 with the  IHB indicator on.  So, I believe there is definitely something going on with my heart.  Another question I have is that if I don't have AFIB, but maybe some other form of arrythmia, do I need to change my medication and no longer be on Eliquis?  What do these PVC's and SVT episodes shown on the monitor indicate?  Is that a problem that needs to be addressed?  Mild COPD  Calcium score 0  ECHO normal EF.    He presented to [name] office several days ago for his routine yearly physical. He was incidentally found to have rapid atrial fibrillation. He was started on Eliquis at that time. He returned to see [name] today and he was found to have atrial flutter at a rate of 146.   Past Medical History:  Diagnosis Date  . Arthritis    knees  . COPD (chronic obstructive pulmonary disease) (HCC)    NEWLY DX , NOW RX'D INHALER PRN , NOT HAD TO USE YET   . Deep vein thrombosis (DVT) of calf muscle vein (HCC) 3 YEARS AGO   LEFT CALF , UNSURE HOW IT WAS TREATED   . Dyspnea    exercise  . Hypertension   . Prostate cancer ([place])   . Tinnitus   . Trace cataracts  Past Surgical History:  Procedure Laterality Date  . CATARACT EXTRACTION, BILATERAL  2018  . PROSTATE BIOPSY    . RADIOACTIVE SEED IMPLANT N/A 03/01/2014   Procedure: RADIOACTIVE SEED IMPLANT;  Surgeon: [name], MD;  Location: [facility];  Service: Urology;  Laterality: N/A;  . TOTAL KNEE ARTHROPLASTY Right 05/15/2018   Procedure: TOTAL KNEE ARTHROPLASTY;  Surgeon: [name], MD;  Location: [facility];  Service: Orthopedics;  Laterality: Right;  50min  . VENTRAL HERNIA REPAIR      Current Medications: Current Meds  Medication Sig  . albuterol (PROVENTIL HFA;VENTOLIN HFA) 108 (90 Base) MCG/ACT inhaler Inhale 2 puffs into the lungs every 6 (six) hours as needed for wheezing or shortness of breath.  [name] apixaban (ELIQUIS) 5 MG TABS tablet Take 1 tablet (5 mg total) by mouth 2 (two) times daily.  .  fluocinonide (LIDEX) 0.05 % external solution Apply 1 application topically daily as needed (rash).   . fluticasone (FLONASE) 50 MCG/ACT nasal spray Place 2 sprays into both nostrils daily as needed for allergies or rhinitis.  [name] LORazepam (ATIVAN) 0.5 MG tablet Take 0.5 mg by mouth daily as needed for sleep.   . metoprolol tartrate (LOPRESSOR) 50 MG tablet Take 1 tablet (50 mg total) by mouth 2 (two) times daily.  [name] telmisartan (MICARDIS) 80 MG tablet Take 80 mg by mouth daily.  . [DISCONTINUED] metoprolol tartrate (LOPRESSOR) 25 MG tablet Take 1 tablet (25 mg total) by mouth 2 (two) times daily.     Allergies:   Penicillins   Social History   Socioeconomic History  . Marital status: Married    Spouse name: Not on file  . Number of children: Not on file  . Years of education: Not on file  . Highest education level: Not on file  Occupational History  . Not on file  Tobacco Use  . Smoking status: Former Smoker    Packs/day: 2.00    Years: 42.00    Pack years: 84.00    Types: Cigarettes    Quit date: 03/29/2004    Years since quitting: 16.3  . Smokeless tobacco: Never Used  Vaping Use  . Vaping Use: Never used  Substance and Sexual Activity  . Alcohol use: Yes    Alcohol/week: 12.0 standard drinks    Types: 10 Glasses of wine, 2 Standard drinks or equivalent per week    Comment: 2 glasses of wine evening of 05/14/2018  . Drug use: No  . Sexual activity: Never  Other Topics Concern  . Not on file  Social History Narrative  . Not on file   Social Determinants of Health   Financial Resource Strain: Not on file  Food Insecurity: Not on file  Transportation Needs: Not on file  Physical Activity: Not on file  Stress: Not on file  Social Connections: Not on file     Family History: The patient's family history includes Heart failure in his father and mother. There is no history of Cancer.  ROS:   Please see the history of present illness.     All other systems reviewed  and are negative.  EKGs/Labs/Other Studies Reviewed:    Recent Labs: No results found for requested labs within last 8760 hours.  Recent Lipid Panel No results found for: CHOL, TRIG, HDL, CHOLHDL, VLDL, LDLCALC, LDLDIRECT   Physical Exam:    VS:  BP 110/68   Pulse 64   Ht 5\' 11"  (1.803 m)   Wt 192 lb (87.1  kg)   SpO2 97%   BMI 26.78 kg/m     Wt Readings from Last 3 Encounters:  08/07/20 192 lb (87.1 kg)  06/24/20 190 lb (86.2 kg)  04/17/20 191 lb 12.8 oz (87 kg)     GEN:  Well nourished, well developed in no acute distress HEENT: Normal NECK: No JVD; No carotid bruits LYMPHATICS: No lymphadenopathy CARDIAC: RRR, no murmurs, rubs, gallops RESPIRATORY:  Clear to auscultation without rales, wheezing or rhonchi  ABDOMEN: Soft, non-tender, non-distended MUSCULOSKELETAL:  No edema; No deformity  SKIN: Warm and dry NEUROLOGIC:  Alert and oriented x 3 PSYCHIATRIC:  Normal affect   ASSESSMENT:    1. Chest pain of uncertain etiology   2. Precordial pain   3. Primary hypertension    PLAN:    In order of problems listed above:   PAF --seen previously at [name] office.  --Zio reviewed with several questions answered as above --Does have SVT bursts. Will increase metoprolol to 50 BID. Sending Rx.  --EP if symptoms do not improve --Wife went to [place] years ago for SVT ablation.   HTN  -Much better control currently.  Doing well.  Continue with current med mgt. Watch with increased metoprolol.   Chronic anticoagulation -Continue with Eliquis 5mg  BID. No bleeding. Expressed the importance.   Chest pain/pressure - We will check a pharmacologic stress test to ensure no signs of ischemia.    Medication Adjustments/Labs and Tests Ordered: Current medicines are reviewed at length with the patient today.  Concerns regarding medicines are outlined above.  Orders Placed This Encounter  Procedures  . MYOCARDIAL PERFUSION IMAGING   Meds ordered this  encounter  Medications  . metoprolol tartrate (LOPRESSOR) 50 MG tablet    Sig: Take 1 tablet (50 mg total) by mouth 2 (two) times daily.    Dispense:  180 tablet    Refill:  3    Please d/c any other rx for metoprolol tartrate    Patient Instructions  Medication Instructions:  Please increase Metoprolol Tartrate to 25 mg twice a day. Continue all other medications as listed.  *If you need a refill on your cardiac medications before your next appointment, please call your pharmacy*  Testing/Procedures: Your physician has requested that you have a lexiscan myoview. For further information please visit [email or web address]. Please follow instruction sheet, as given.  Follow-Up: At [facility], you and your health needs are our priority.  As part of our continuing mission to provide you with exceptional heart care, we have created designated Provider Care Teams.  These Care Teams include your primary Cardiologist (physician) and Advanced Practice Providers (APPs -  Physician Assistants and Nurse Practitioners) who all work together to provide you with the care you need, when you need it.  We recommend signing up for the patient portal called "MyChart".  Sign up information is provided on this After Visit Summary.  MyChart is used to connect with patients for Virtual Visits (Telemedicine).  Patients are able to view lab/test results, encounter notes, upcoming appointments, etc.  Non-urgent messages can be sent to your provider as well.   To learn more about what you can do with MyChart, go to [email or web address].    Your next appointment:   6 month(s)  The format for your next appointment:   In Person  Provider:   [name], MD   Thank you for choosing [facility]!!        Signed, [name], MD  08/07/2020 11:31 AM  [facility]

## 2020-08-07 NOTE — Addendum Note (Signed)
Addended by: [name] C on: 08/07/2020 11:39 AM   Modules accepted: Orders

## 2020-08-07 NOTE — Patient Instructions (Signed)
Medication Instructions:  Please increase Metoprolol Tartrate to 25 mg twice a day. Continue all other medications as listed.  *If you need a refill on your cardiac medications before your next appointment, please call your pharmacy*  Testing/Procedures: Your physician has requested that you have a lexiscan myoview. For further information please visit [email or web address]. Please follow instruction sheet, as given.  Follow-Up: At [facility], you and your health needs are our priority.  As part of our continuing mission to provide you with exceptional heart care, we have created designated Provider Care Teams.  These Care Teams include your primary Cardiologist (physician) and Advanced Practice Providers (APPs -  Physician Assistants and Nurse Practitioners) who all work together to provide you with the care you need, when you need it.  We recommend signing up for the patient portal called "MyChart".  Sign up information is provided on this After Visit Summary.  MyChart is used to connect with patients for Virtual Visits (Telemedicine).  Patients are able to view lab/test results, encounter notes, upcoming appointments, etc.  Non-urgent messages can be sent to your provider as well.   To learn more about what you can do with MyChart, go to [email or web address].    Your next appointment:   6 month(s)  The format for your next appointment:   In Person  Provider:   [name], MD   Thank you for choosing [facility]!!

## 2020-08-12 ENCOUNTER — Telehealth (HOSPITAL_COMMUNITY): Payer: Self-pay

## 2020-08-12 NOTE — Telephone Encounter (Signed)
Spoke with the patient's wife, detailed instructions given. She stated that he would be here for his test. Asked to call back with any questions. S.Lynise Porr EMTP  ?

## 2020-08-14 ENCOUNTER — Ambulatory Visit (HOSPITAL_COMMUNITY): Payer: Medicare Other | Attending: Cardiology

## 2020-08-14 ENCOUNTER — Other Ambulatory Visit: Payer: Self-pay

## 2020-08-14 DIAGNOSIS — R079 Chest pain, unspecified: Secondary | ICD-10-CM | POA: Insufficient documentation

## 2020-08-14 DIAGNOSIS — R072 Precordial pain: Secondary | ICD-10-CM | POA: Insufficient documentation

## 2020-08-14 LAB — MYOCARDIAL PERFUSION IMAGING
LV dias vol: 111 mL (ref 62–150)
LV sys vol: 46 mL
Peak HR: 66 {beats}/min
Rest HR: 50 {beats}/min
SDS: 0
SRS: 0
SSS: 0
TID: 1.03

## 2020-08-14 MED ORDER — TECHNETIUM TC 99M TETROFOSMIN IV KIT
10.1000 | PACK | Freq: Once | INTRAVENOUS | Status: AC | PRN
  Administered 2020-08-14: 10.1 via INTRAVENOUS
  Filled 2020-08-14: qty 11

## 2020-08-14 MED ORDER — REGADENOSON 0.4 MG/5ML IV SOLN: 0.4000 mg | Freq: Once | INTRAVENOUS | Status: AC

## 2020-08-14 MED ORDER — TECHNETIUM TC 99M TETROFOSMIN IV KIT
30.7000 | PACK | Freq: Once | INTRAVENOUS | Status: AC | PRN
  Filled 2020-08-14: qty 31

## 2020-09-08 ENCOUNTER — Other Ambulatory Visit: Payer: Self-pay | Admitting: Cardiovascular Disease

## 2020-09-08 DIAGNOSIS — Z23 Encounter for immunization: Secondary | ICD-10-CM | POA: Diagnosis not present

## 2020-09-09 NOTE — Telephone Encounter (Signed)
Pt last saw Dr Marlou Porch 08/07/20, last labs 04/17/20 Creat 0.86, age 75, weight 87.1kg, based on specified criteria pt is on appropriate dosage of Eliquis 5mg  BID.  Will refill rx.

## 2020-09-10 DIAGNOSIS — C61 Malignant neoplasm of prostate: Secondary | ICD-10-CM | POA: Diagnosis not present

## 2020-09-17 DIAGNOSIS — R350 Frequency of micturition: Secondary | ICD-10-CM | POA: Diagnosis not present

## 2020-09-17 DIAGNOSIS — C61 Malignant neoplasm of prostate: Secondary | ICD-10-CM | POA: Diagnosis not present

## 2020-11-21 ENCOUNTER — Other Ambulatory Visit: Payer: Self-pay

## 2020-11-21 MED ORDER — METOPROLOL TARTRATE 50 MG PO TABS: 50.0000 mg | ORAL_TABLET | Freq: Two times a day (BID) | ORAL | 1 refills | Status: DC

## 2020-11-24 ENCOUNTER — Other Ambulatory Visit: Payer: Self-pay | Admitting: *Deleted

## 2020-11-27 DIAGNOSIS — H04123 Dry eye syndrome of bilateral lacrimal glands: Secondary | ICD-10-CM | POA: Diagnosis not present

## 2020-11-27 DIAGNOSIS — H53142 Visual discomfort, left eye: Secondary | ICD-10-CM | POA: Diagnosis not present

## 2020-11-27 DIAGNOSIS — H40013 Open angle with borderline findings, low risk, bilateral: Secondary | ICD-10-CM | POA: Diagnosis not present

## 2020-11-27 DIAGNOSIS — H35033 Hypertensive retinopathy, bilateral: Secondary | ICD-10-CM | POA: Diagnosis not present

## 2020-11-27 DIAGNOSIS — H35013 Changes in retinal vascular appearance, bilateral: Secondary | ICD-10-CM | POA: Diagnosis not present

## 2020-12-26 ENCOUNTER — Ambulatory Visit (INDEPENDENT_AMBULATORY_CARE_PROVIDER_SITE_OTHER): Payer: Medicare Other | Admitting: Cardiology

## 2020-12-26 ENCOUNTER — Ambulatory Visit: Payer: Medicare Other | Admitting: Cardiology

## 2020-12-26 ENCOUNTER — Encounter: Payer: Self-pay | Admitting: Cardiology

## 2020-12-26 ENCOUNTER — Other Ambulatory Visit: Payer: Self-pay

## 2020-12-26 VITALS — BP 120/70 | HR 62 | Ht 66.0 in | Wt 198.0 lb

## 2020-12-26 DIAGNOSIS — Z7901 Long term (current) use of anticoagulants: Secondary | ICD-10-CM | POA: Diagnosis not present

## 2020-12-26 DIAGNOSIS — I480 Paroxysmal atrial fibrillation: Secondary | ICD-10-CM | POA: Diagnosis not present

## 2020-12-26 NOTE — Assessment & Plan Note (Signed)
Eliquis 5mg  PO BID. Refills as needed. Nosebleeds noted.  I have seen Wayne Roberson is a 75 y.o. male in the office today. The patient is felt to be a poor candidate for long-term anticoagulation because of  Severe nosebleeds.  Their CHADS-2-Vasc Score and unadjusted Ischemic Stroke Rate (% per year) is equal to 3.2 % stroke rate/year from a score of 3, necessitating a strategy of stroke prevention with either long-term oral anticoagulation or left atrial appendage occlusion therapy. We have discussed their bleeding risk in the context of their comorbid medical problems, as well as the rationale for referral for evaluation of Watchman left atrial appendage occlusion therapy. While the patient is at high long-term bleeding risk, they may be appropriate for short-term anticoagulation. Based on this individual patient's stroke and bleeding risk, a shared decision has been made to refer the patient for consideration of Watchman left atrial appendage closure utilizing the [employer] of Cardiology shared decision tool.

## 2020-12-26 NOTE — Assessment & Plan Note (Addendum)
See ECG 04/15/2020 in [facility] --seen previously at [name] office. He presented to [name] office for his routine yearly physical. He was incidentally found to have rapid atrial fibrillation. He was started on Eliquis at that time. He returned to see [name] and he was found to have atrial flutter at a rate of 146.--Zio reviewed with several questions answered as above --Does have SVT bursts. Previously increased metoprolol to 50 BID. Still having breakthrough --Sending to Dr. Quentin Ore for ablation discussion as well as Watchman discussion (has severe nosebleeds) --Wife went to [place] years ago for SVT ablation.

## 2020-12-26 NOTE — Progress Notes (Addendum)
Cardiology Office Note:    Date:  12/26/2020   ID:  Wayne Roberson, DOB June 20, 1945, MRN [id number]  PCP:  [name], MD   [facility] Providers Cardiologist:  [name], MD     Referring MD: [name], MD    History of Present Illness:    Wayne Roberson is a 75 y.o. male here for follow up of PAF. See ECG in Epic 04/15/20   Appreciate pharmacy team assistance.  Blood pressure under good control with current medications.  Continuing telmisartan and metoprolol.  Eliquis.  Cost has been challenging.   Sinus rhythm with average heart rate of 65 bpm  There were symptoms of chest pressure, all associated with benign sinus rhythm  One 4 beats run of consecutive PVC's, non sustained and asymptomatic  41 SVT episodes longest 32 seconds with avg rate of 118 bpm, fastest 5 beats at 210 bpm  Rare PAC's  Occasional PVC's (1.3%)  No atrial fibrillation, no pauses  Min heart rate during sleep, transiently 39 bpm, benign     The results of, and your comments on, the long- term monitor report leaves me with some questions.  Does the "no atrial fibrillation detected" mean I don't have AFIB?  Or does it just mean that it did not happen to occur during the 2 weeks when I wore the heart monitor?  Both you and Dr. Acie Fredrickson concurred that I did have paradoxical AFIB, based on the EKGs which were done prior to my last office visit with you. So, if I don't have AFIB, what do I have?  Some other type of arrythmia?  I still am having heart episodes multiple times per week, when I have detectable pain in my chest, and also some nausea, which sometimes causes me to need to lie down on the bed for a while until the episode subsides. This past Sunday, when I had an episode,  I put the oximeter that I have on my finger to see what was registered.  The wave form it showed for my heartbeat was erratic and wouldn't register my oxygen level or heartbeat.  So, I put on my blood pressure monitor and it  registered 105/74 with the IHB indicator on.  So, I believe there is definitely something going on with my heart.  Another question I have is that if I don't have AFIB, but maybe some other form of arrythmia, do I need to change my medication and no longer be on Eliquis?  What do these PVC's and SVT episodes shown on the monitor indicate?  Is that a problem that needs to be addressed?  Mild COPD   Calcium score 0   ECHO normal EF.    He presented to [name] office for his routine yearly physical. He was incidentally found to have rapid atrial fibrillation. He was started on Eliquis at that time. He returned to see [name] and he was found to have atrial flutter at a rate of 146.  Today: He is accompanied by a family member. Generally he has been feeling good.   However, he reports random, intermittent episodes of sharp left chest pain associated with elevated heart rates as high as 140. During some episodes he has also noticed very low blood pressure readings such as 103/50, with generalized weakness. He notes that his heart monitor device shows either "IFB" or "Afib" during these episodes.  Lately, he has been experiencing epistaxis more frequently, and more severely with darker-colored blood.  He denies any shortness of breath. No lightheadedness, headaches, syncope, orthopnea, or PND. Also has no lower extremity edema.  Past Medical History:  Diagnosis Date   Arthritis    knees   COPD (chronic obstructive pulmonary disease) ([place])    NEWLY DX , NOW RX'D INHALER PRN , NOT HAD TO USE YET    Deep vein thrombosis (DVT) of calf muscle vein (HCC) 3 YEARS AGO   LEFT CALF , UNSURE HOW IT WAS TREATED    Dyspnea    exercise   Hypertension    Prostate cancer ([place])    Tinnitus    Trace cataracts     Past Surgical History:  Procedure Laterality Date   CATARACT EXTRACTION, BILATERAL  2018   PROSTATE BIOPSY     RADIOACTIVE SEED IMPLANT N/A 03/01/2014   Procedure: RADIOACTIVE  SEED IMPLANT;  Surgeon: [name], MD;  Location: [facility];  Service: Urology;  Laterality: N/A;   TOTAL KNEE ARTHROPLASTY Right 05/15/2018   Procedure: TOTAL KNEE ARTHROPLASTY;  Surgeon: [name], MD;  Location: [facility];  Service: Orthopedics;  Laterality: Right;  39min   VENTRAL HERNIA REPAIR      Current Medications: Current Meds  Medication Sig   albuterol (PROVENTIL HFA;VENTOLIN HFA) 108 (90 Base) MCG/ACT inhaler Inhale 2 puffs into the lungs every 6 (six) hours as needed for wheezing or shortness of breath.   ELIQUIS 5 MG TABS tablet TAKE 1 TABLET TWICE A DAY   fluocinonide (LIDEX) 0.05 % external solution Apply 1 application topically daily as needed (rash).    fluticasone (FLONASE) 50 MCG/ACT nasal spray Place 2 sprays into both nostrils daily as needed for allergies or rhinitis.   LORazepam (ATIVAN) 0.5 MG tablet Take 0.5 mg by mouth daily as needed for sleep.    metoprolol tartrate (LOPRESSOR) 50 MG tablet Take 1 tablet (50 mg total) by mouth 2 (two) times daily.   telmisartan (MICARDIS) 80 MG tablet Take 80 mg by mouth daily.     Allergies:   Penicillins   Social History   Socioeconomic History   Marital status: Married    Spouse name: Not on file   Number of children: Not on file   Years of education: Not on file   Highest education level: Not on file  Occupational History   Not on file  Tobacco Use   Smoking status: Former    Packs/day: 2.00    Years: 42.00    Pack years: 84.00    Types: Cigarettes    Quit date: 03/29/2004    Years since quitting: 16.7   Smokeless tobacco: Never  Vaping Use   Vaping Use: Never used  Substance and Sexual Activity   Alcohol use: Yes    Alcohol/week: 12.0 standard drinks    Types: 10 Glasses of wine, 2 Standard drinks or equivalent per week    Comment: 2 glasses of wine evening of 05/14/2018   Drug use: No   Sexual activity: Never  Other Topics Concern   Not on file  Social History Narrative   Not on  file   Social Determinants of Health   Financial Resource Strain: Not on file  Food Insecurity: Not on file  Transportation Needs: Not on file  Physical Activity: Not on file  Stress: Not on file  Social Connections: Not on file     Family History: The patient's family history includes Heart failure in his father and mother. There is no history of Cancer.  ROS:  Please see the history of present illness.    (+) Sharp, left chest pain (+) Palpitations (+) Generalized weakness (+) Epistaxis All other systems reviewed and are negative.  EKGs/Labs/Other Studies Reviewed:    ECG    Lexiscan Myoview 08/14/2020: Nuclear stress EF: 58%. The left ventricular ejection fraction is normal (55-65%). There was no ST segment deviation noted during stress. The study is normal. This is a low risk study.  Monitor 07/16/2020: Sinus rhyhtm with average heart rate of 65 bpm There were symptoms of chest pressure, all associated with benign sinus rhythm One 4 beats run of consecutive PVC's, non sustained and asymptomatic 41 SVT episodes longest 32 seconds with avg rate of 118 bpm, fastest 5 beats at 210 bpm Rare PAC's Occasional PVC's (1.3%) No atrial fibrillation, no pauses Min heart rate during sleep, transiently 39 bpm, benign     Patch Wear Time:  13 days and 23 hours (2022-04-01T14:55:10-398 to 2022-04-15T14:11:09-398)   Patient had a min HR of 39 bpm, max HR of 210 bpm, and avg HR of 65 bpm. Predominant underlying rhythm was Sinus Rhythm. 1 run of Ventricular Tachycardia occurred lasting 4 beats with a max rate of 162 bpm (avg 150 bpm). 41 Supraventricular Tachycardia  runs occurred, the run with the fastest interval lasting 5 beats with a max rate of 210 bpm, the longest lasting 32.8 secs with an avg rate of 118 bpm. Isolated SVEs were rare (<1.0%), SVE Couplets were rare (<1.0%), and SVE Triplets were rare (<1.0%).  Isolated VEs were occasional (1.3%, 17091), VE Couplets were rare  (<1.0%, 128), and VE Triplets were rare (<1.0%, 6). Ventricular Bigeminy and Trigeminy were present.   Echo 05/02/2020: 1. Left ventricular ejection fraction, by estimation, is 60 to 65%. The  left ventricle has normal function. The left ventricle has no regional  wall motion abnormalities. Left ventricular diastolic parameters are  consistent with Grade I diastolic  dysfunction (impaired relaxation). The average left ventricular global  longitudinal strain is -20.1 %. The global longitudinal strain is normal.   2. Right ventricular systolic function is normal. The right ventricular  size is normal.   3. The mitral valve is normal in structure. Trivial mitral valve  regurgitation.   4. The aortic valve is tricuspid. There is mild thickening of the aortic  valve. Aortic valve regurgitation is not visualized. No aortic stenosis is  present.   5. The inferior vena cava is normal in size with greater than 50%  respiratory variability, suggesting right atrial pressure of 3 mmHg.   Comparison(s): A prior study was performed on 08/10/2016. No significant  change from prior study. Prior images reviewed side by side.   Cardiac CT W/Score 02/24/2018: FINDINGS: A 120 kV prospective scan was triggered in the descending thoracic aorta at 111 HU's. Axial non-contrast 3 mm slices were carried out through the heart. The data set was analyzed on a dedicated work station and scored using the [place]. Gantry rotation speed was 250 msecs and collimation was .6 mm. No beta blockade and 0.8 mg of sl NTG was given. The 3D data set was reconstructed in 5% intervals of the 67-82 % of the R-R cycle. Diastolic phases were analyzed on a dedicated work station using MPR, MIP and VRT modes. The patient received 80 cc of contrast.   Aorta: Normal size. Minimal atheroma, no calcifications. No dissection.   Aortic Valve:  Trileaflet.  No calcifications.   Coronary Arteries:  Normal coronary origin.  Right  dominance.  RCA is a large dominant artery that gives rise to PDA and PLA. There is no plaque.   Left main is a large artery that gives rise to LAD and LCX arteries. Left main has no plaque.   LAD is a large vessel that gives rise to one large diagonal artery and has no plaque.   LCX is a non-dominant artery that gives rise to two large OM branches. There is no plaque.   Other findings:   Normal pulmonary vein drainage into the left atrium.   Normal let atrial appendage without a thrombus.   Normal size of the pulmonary artery.   IMPRESSION: 1. Coronary calcium score of 0. This was 0 percentile for age and sex matched control.   2. Normal coronary origin with right dominance.   3. No evidence for CAD.  TEE 08/10/2016: - Left ventricle: The cavity size was normal. Wall thickness was    normal. Systolic function was normal. The estimated ejection    fraction was in the range of 55% to 60%. Wall motion was normal;    there were no regional wall motion abnormalities. Doppler    parameters are consistent with abnormal left ventricular    relaxation (grade 1 diastolic dysfunction).   Impressions:  - Normal LV systolic function; mild diastolic dysfunction.   ETT 08/10/2016: Blood pressure demonstrated a hypertensive response to exercise. No T wave inversion was noted during stress. There was no ST segment deviation noted during stress. Blood pressure demonstrated a hypertensive response to exercise Overall, the patient's exercise capacity was normal. Duke Treadmill Score: low risk Arrhythmias during stress: rare PVCs. Arrhythmias during recovery: rare PVCs   Negative stress test without evidence of ischemia at given workload. Rare PVC's noted with exercise and in recovery. Hypertensive response to exercise.  Recent Labs: No results found for requested labs within last 8760 hours.   Recent Lipid Panel No results found for: CHOL, TRIG, HDL, CHOLHDL, VLDL, LDLCALC,  LDLDIRECT   Physical Exam:    VS:  BP 120/70 (BP Location: Left Arm, Patient Position: Sitting, Cuff Size: Normal)   Pulse 62   Ht 5\' 6"  (1.676 m)   Wt 198 lb (89.8 kg)   SpO2 95%   BMI 31.96 kg/m     Wt Readings from Last 3 Encounters:  12/26/20 198 lb (89.8 kg)  08/14/20 192 lb (87.1 kg)  08/07/20 192 lb (87.1 kg)    GEN: Well nourished, well developed in no acute distress HEENT: Normal NECK: No JVD; No carotid bruits LYMPHATICS: No lymphadenopathy CARDIAC: RRR, no murmurs, rubs, gallops RESPIRATORY:  Clear to auscultation without rales, wheezing or rhonchi  ABDOMEN: Soft, non-tender, non-distended MUSCULOSKELETAL:  No edema; No deformity  SKIN: Warm and dry NEUROLOGIC:  Alert and oriented x 3 PSYCHIATRIC:  Normal affect    ASSESSMENT:    1. Paroxysmal atrial fibrillation (HCC)   2. Chronic anticoagulation     PLAN:    In order of problems listed above: Atrial fibrillation ([place]) See ECG 04/15/2020 in EPIC --seen previously at [name] office. He presented to [name] office for his routine yearly physical. He was incidentally found to have rapid atrial fibrillation. He was started on Eliquis at that time. He returned to see [name] and he was found to have atrial flutter at a rate of 146.--Zio reviewed with several questions answered as above --Does have SVT bursts. Previously increased metoprolol to 50 BID. Still having breakthrough --Sending to Dr. Quentin Ore for ablation discussion as well  as Watchman discussion (has severe nosebleeds) --Wife went to [place] years ago for SVT ablation.   Chronic anticoagulation Eliquis 5mg  PO BID. Refills as needed. Nosebleeds noted.  I have seen Wayne Roberson is a 75 y.o. male in the office today. The patient is felt to be a poor candidate for long-term anticoagulation because of  Severe nosebleeds.  Their CHADS-2-Vasc Score and unadjusted Ischemic Stroke Rate (% per year) is equal to 3.2 % stroke rate/year from a  score of 3, necessitating a strategy of stroke prevention with either long-term oral anticoagulation or left atrial appendage occlusion therapy. We have discussed their bleeding risk in the context of their comorbid medical problems, as well as the rationale for referral for evaluation of Watchman left atrial appendage occlusion therapy. While the patient is at high long-term bleeding risk, they may be appropriate for short-term anticoagulation. Based on this individual patient's stroke and bleeding risk, a shared decision has been made to refer the patient for consideration of Watchman left atrial appendage closure utilizing the [employer] of Cardiology shared decision tool.   Follow-up: 1 year.  Medication Adjustments/Labs and Tests Ordered: Current medicines are reviewed at length with the patient today.  Concerns regarding medicines are outlined above.   Orders Placed This Encounter  Procedures   Ambulatory referral to Cardiac Electrophysiology    No orders of the defined types were placed in this encounter.  Patient Instructions  Medication Instructions:  The current medical regimen is effective;  continue present plan and medications.  *If you need a refill on your cardiac medications before your next appointment, please call your pharmacy*  You have been referred to Dr Lars Mage to discuss Watchman device and possible Ablation.  Follow-Up: At [facility], you and your health needs are our priority.  As part of our continuing mission to provide you with exceptional heart care, we have created designated Provider Care Teams.  These Care Teams include your primary Cardiologist (physician) and Advanced Practice Providers (APPs -  Physician Assistants and Nurse Practitioners) who all work together to provide you with the care you need, when you need it.  We recommend signing up for the patient portal called "MyChart".  Sign up information is provided on this  After Visit Summary.  MyChart is used to connect with patients for Virtual Visits (Telemedicine).  Patients are able to view lab/test results, encounter notes, upcoming appointments, etc.  Non-urgent messages can be sent to your provider as well.   To learn more about what you can do with MyChart, go to [email or web address].    Your next appointment:   1 year(s)  The format for your next appointment:   In Person  Provider:   [name], MD   Thank you for choosing [facility]!!     I,Mathew Stumpf,acting as a scribe for [name], MD.,have documented all relevant documentation on the behalf of [name], MD,as directed by  [name], MD while in the presence of [name], MD.  I, [name], MD, have reviewed all documentation for this visit. The documentation on 12/26/20 for the exam, diagnosis, procedures, and orders are all accurate and complete.   Signed, [name], MD  12/26/2020 9:50 AM    [facility]

## 2020-12-26 NOTE — Patient Instructions (Signed)
Medication Instructions:  The current medical regimen is effective;  continue present plan and medications.  *If you need a refill on your cardiac medications before your next appointment, please call your pharmacy*  You have been referred to Dr Lars Mage to discuss Watchman device and possible Ablation.  Follow-Up: At [facility], you and your health needs are our priority.  As part of our continuing mission to provide you with exceptional heart care, we have created designated Provider Care Teams.  These Care Teams include your primary Cardiologist (physician) and Advanced Practice Providers (APPs -  Physician Assistants and Nurse Practitioners) who all work together to provide you with the care you need, when you need it.  We recommend signing up for the patient portal called "MyChart".  Sign up information is provided on this After Visit Summary.  MyChart is used to connect with patients for Virtual Visits (Telemedicine).  Patients are able to view lab/test results, encounter notes, upcoming appointments, etc.  Non-urgent messages can be sent to your provider as well.   To learn more about what you can do with MyChart, go to [email or web address].    Your next appointment:   1 year(s)  The format for your next appointment:   In Person  Provider:   [name], MD   Thank you for choosing [facility]!!

## 2021-01-12 DIAGNOSIS — H3581 Retinal edema: Secondary | ICD-10-CM | POA: Diagnosis not present

## 2021-01-12 DIAGNOSIS — H35033 Hypertensive retinopathy, bilateral: Secondary | ICD-10-CM | POA: Diagnosis not present

## 2021-01-15 DIAGNOSIS — Z23 Encounter for immunization: Secondary | ICD-10-CM | POA: Diagnosis not present

## 2021-01-29 DIAGNOSIS — H353111 Nonexudative age-related macular degeneration, right eye, early dry stage: Secondary | ICD-10-CM | POA: Diagnosis not present

## 2021-01-29 DIAGNOSIS — D3132 Benign neoplasm of left choroid: Secondary | ICD-10-CM | POA: Diagnosis not present

## 2021-01-29 DIAGNOSIS — H353221 Exudative age-related macular degeneration, left eye, with active choroidal neovascularization: Secondary | ICD-10-CM | POA: Diagnosis not present

## 2021-01-29 DIAGNOSIS — Z961 Presence of intraocular lens: Secondary | ICD-10-CM | POA: Diagnosis not present

## 2021-01-30 ENCOUNTER — Ambulatory Visit (INDEPENDENT_AMBULATORY_CARE_PROVIDER_SITE_OTHER): Payer: Medicare Other | Admitting: Cardiology

## 2021-01-30 ENCOUNTER — Other Ambulatory Visit: Payer: Self-pay

## 2021-01-30 ENCOUNTER — Encounter: Payer: Self-pay | Admitting: Cardiology

## 2021-01-30 VITALS — BP 120/82 | HR 59 | Ht 70.5 in | Wt 198.0 lb

## 2021-01-30 DIAGNOSIS — I10 Essential (primary) hypertension: Secondary | ICD-10-CM

## 2021-01-30 DIAGNOSIS — R040 Epistaxis: Secondary | ICD-10-CM | POA: Diagnosis not present

## 2021-01-30 DIAGNOSIS — I480 Paroxysmal atrial fibrillation: Secondary | ICD-10-CM | POA: Diagnosis not present

## 2021-01-30 NOTE — Progress Notes (Signed)
Electrophysiology Office Note:    Date:  01/30/2021   ID:  Wayne Roberson, DOB [birth year], MRN [id number]  PCP:  [name], MD  [facility] Cardiologist:  [name], MD  [facility] Electrophysiologist:  Vickie Epley, MD   Referring MD: Jerline Pain, MD   Chief Complaint: Atrial fibrillation  History of Present Illness:    Wayne Roberson is a 75 y.o. male who presents for an evaluation of atrial fibrillation at the request of Dr. Marlou Porch. Their medical history includes COPD, hypertension, remote DVT, prostate cancer.  The patient was last seen by Dr. Marlou Porch December 26, 2020.  At that appointment he reported difficulties with the cost of Eliquis and severe nosebleeds while on anticoagulation.  He is referred to discuss atrial fibrillation ablation and watchman implant.  He is with his wife today in clinic.  They have an extensive list of questions about his atrial fibrillation and about the watchman implant.  His wife has a history of SVT that was previously ablated at the [facility] clinic by Dr. Wendi Maya.  His atrial fibrillation episodes are symptomatic.  He feels fatigued.  He is concerned about worsening with time and is interested in a rhythm control strategy.   Past Medical History:  Diagnosis Date   Arthritis    knees   COPD (chronic obstructive pulmonary disease) ([place])    NEWLY DX , NOW RX'D INHALER PRN , NOT HAD TO USE YET    Deep vein thrombosis (DVT) of calf muscle vein (HCC) 3 YEARS AGO   LEFT CALF , UNSURE HOW IT WAS TREATED    Dyspnea    exercise   Hypertension    Prostate cancer ([place])    Tinnitus    Trace cataracts     Past Surgical History:  Procedure Laterality Date   CATARACT EXTRACTION, BILATERAL  2018   PROSTATE BIOPSY     RADIOACTIVE SEED IMPLANT N/A 03/01/2014   Procedure: RADIOACTIVE SEED IMPLANT;  Surgeon: [name], MD;  Location: [facility];  Service: Urology;  Laterality: N/A;   TOTAL KNEE ARTHROPLASTY Right  05/15/2018   Procedure: TOTAL KNEE ARTHROPLASTY;  Surgeon: [name], MD;  Location: [facility];  Service: Orthopedics;  Laterality: Right;  38min   VENTRAL HERNIA REPAIR      Current Medications: Current Meds  Medication Sig   albuterol (PROVENTIL HFA;VENTOLIN HFA) 108 (90 Base) MCG/ACT inhaler Inhale 2 puffs into the lungs every 6 (six) hours as needed for wheezing or shortness of breath.   ELIQUIS 5 MG TABS tablet TAKE 1 TABLET TWICE A DAY   fluocinonide (LIDEX) 0.05 % external solution Apply 1 application topically daily as needed (rash).    fluticasone (FLONASE) 50 MCG/ACT nasal spray Place 2 sprays into both nostrils daily as needed for allergies or rhinitis.   LORazepam (ATIVAN) 0.5 MG tablet Take 0.5 mg by mouth daily as needed for sleep.    metoprolol tartrate (LOPRESSOR) 50 MG tablet Take 1 tablet (50 mg total) by mouth 2 (two) times daily.   telmisartan (MICARDIS) 80 MG tablet Take 80 mg by mouth daily.     Allergies:   Penicillins   Social History   Socioeconomic History   Marital status: Married    Spouse name: Not on file   Number of children: Not on file   Years of education: Not on file   Highest education level: Not on file  Occupational History   Not on file  Tobacco Use  Smoking status: Former    Packs/day: 2.00    Years: 42.00    Pack years: 84.00    Types: Cigarettes    Quit date: 03/29/2004    Years since quitting: 16.8   Smokeless tobacco: Never  Vaping Use   Vaping Use: Never used  Substance and Sexual Activity   Alcohol use: Yes    Alcohol/week: 12.0 standard drinks    Types: 10 Glasses of wine, 2 Standard drinks or equivalent per week    Comment: 2 glasses of wine evening of 05/14/2018   Drug use: No   Sexual activity: Never  Other Topics Concern   Not on file  Social History Narrative   Not on file   Social Determinants of Health   Financial Resource Strain: Not on file  Food Insecurity: Not on file  Transportation Needs: Not on file   Physical Activity: Not on file  Stress: Not on file  Social Connections: Not on file     Family History: The patient's family history includes Heart failure in his father and mother. There is no history of Cancer.  ROS:   Please see the history of present illness.    All other systems reviewed and are negative.  EKGs/Labs/Other Studies Reviewed:    The following studies were reviewed today:  April 15, 2020 EKG Atrial fibrillation with rapid ventricular response  April 17, 2020 EKG Narrow complex tachycardia with a ventricular rate of 150 bpm.  I suspect this is atrial flutter with 2-1 AV conduction.  Also possible is an atrial tach or other SVT.  May 02, 2020 echo Left ventricular function normal, 60% Right ventricular function normal Trivial MR No AR/AAS  Aug 14, 2020 SPECT Low risk study for ischemia  EKG:  The ekg ordered today demonstrates sinus rhythm.  QTC is 400 ms.   Recent Labs: No results found for requested labs within last 8760 hours.  Recent Lipid Panel No results found for: CHOL, TRIG, HDL, CHOLHDL, VLDL, LDLCALC, LDLDIRECT  Physical Exam:    VS:  BP 120/82   Pulse (!) 59   Ht 5' 10.5" (1.791 m)   Wt 198 lb (89.8 kg)   SpO2 98%   BMI 28.01 kg/m     Wt Readings from Last 3 Encounters:  01/30/21 198 lb (89.8 kg)  12/26/20 198 lb (89.8 kg)  08/14/20 192 lb (87.1 kg)     GEN:  Well nourished, well developed in no acute distress HEENT: Normal NECK: No JVD; No carotid bruits LYMPHATICS: No lymphadenopathy CARDIAC: RRR, no murmurs, rubs, gallops RESPIRATORY:  Clear to auscultation without rales, wheezing or rhonchi  ABDOMEN: Soft, non-tender, non-distended MUSCULOSKELETAL:  No edema; No deformity  SKIN: Warm and dry NEUROLOGIC:  Alert and oriented x 3 PSYCHIATRIC:  Normal affect       ASSESSMENT:    1. Paroxysmal atrial fibrillation (HCC)   2. Primary hypertension   3. Nosebleed    PLAN:    In order of problems listed  above:  1. Paroxysmal atrial fibrillation [facility]) Patient has symptomatic atrial fibrillation and is interested in rhythm control strategy.  We discussed the options including antiarrhythmic drug therapy and catheter ablation.  We had extensive discussion about the pros and cons of each strategy.  I discussed the ablation procedure in detail include the risk, recovery and likelihood of success.  We discussed the possibility of needing a second ablation procedure and or need for antiarrhythmic drug therapy post ablation.  We also had an extensive  discussion about the watchman procedure.  We discussed the typical indications.  I do think he is a candidate for the procedure given his history of nosebleeds and desire to avoid long-term exposure anticoagulation.  The patient's wife has concerns about the watchman device and would like to think it over and discuss with friends.  I provided some information today to the patient and his wife about the procedure for them to review.  The wife also plans to discuss with Dr. Wendi Maya when she sees him in follow-up in the next couple of months.  ----------  I have seen Wayne Roberson in the office today who is being considered for a Watchman left atrial appendage closure device. I believe they will benefit from this procedure given their history of atrial fibrillation, CHA2DS2-VASc score of 3 and unadjusted ischemic stroke rate of 3.2% per year. Unfortunately, the patient is not felt to be a long term anticoagulation candidate secondary to frequent nosebleeds and desire to avoid long-term exposure anticoagulation. The patient's chart has been reviewed and I feel that they would be a candidate for short term oral anticoagulation after Watchman implant.   It is my belief that after undergoing a LAA closure procedure, Wayne Roberson will not need long term anticoagulation which eliminates anticoagulation side effects and major bleeding risk.   Procedural risks for the  Watchman implant have been reviewed with the patient including a 0.5% risk of stroke, <1% risk of perforation and <1% risk of device embolization.    The published clinical data on the safety and effectiveness of WATCHMAN include but are not limited to the following: - Holmes DR, Mechele Claude, Sick P et al. for the PROTECT AF Investigators. Percutaneous closure of the left atrial appendage versus warfarin therapy for prevention of stroke in patients with atrial fibrillation: a randomised non-inferiority trial. Lancet 2009; 374: 534-42. Mechele Claude, Doshi SK, Abelardo Diesel D et al. on behalf of the PROTECT AF Investigators. Percutaneous Left Atrial Appendage Closure for Stroke Prophylaxis in Patients With Atrial Fibrillation 2.3-Year Follow-up of the PROTECT AF (Watchman Left Atrial Appendage System for Embolic Protection in Patients With Atrial Fibrillation) Trial. Circulation 2013; 127:720-729. - Alli O, Doshi S,  Kar S, Reddy VY, Sievert H et al. Quality of Life Assessment in the Randomized PROTECT AF (Percutaneous Closure of the Left Atrial Appendage Versus Warfarin Therapy for Prevention of Stroke in Patients With Atrial Fibrillation) Trial of Patients at Risk for Stroke With Nonvalvular Atrial Fibrillation. J Am Coll Cardiol 2013; 00:9381-8. Vertell Limber DR, Tarri Abernethy, Price M, [place], Sievert H, Doshi S, Huber K, Reddy V. Prospective randomized evaluation of the Watchman left atrial appendage Device in patients with atrial fibrillation versus long-term warfarin therapy; the PREVAIL trial. Journal of the [employer] of Cardiology, Vol. 4, No. 1, 2014, 1-11. - Kar S, Doshi SK, Sadhu A, Horton R, Osorio J et al. Primary outcome evaluation of a next-generation left atrial appendage closure device: results from the PINNACLE FLX trial. Circulation 2021;143(18)1754-1762.    After today's visit with the patient which was dedicated solely for shared decision making visit regarding LAA closure device, the  patient and his wife would like some time to think over both procedures before making a final decision.  It seems like they would like to think it over and discuss with his wife's [facility] team.  If the patient elects to proceed with either procedure, we will plan for a CT PV protocol for left atrial assessment and  for measurements of the left atrial appendage.  HAS-BLED score 3 Hypertension Yes  Abnormal renal and liver function (Dialysis, transplant, Cr >2.26 mg/dL /Cirrhosis or Bilirubin >2x Normal or AST/ALT/AP >3x Normal) No  Stroke No  Bleeding Yes  Labile INR (Unstable/high INR) No  Elderly (>65) Yes  Drugs or alcohol (? 8 drinks/week, anti-plt or NSAID) No    2. Primary hypertension Controlled.  Continue current regimen.  3. Nosebleed See above.      Total time spent with patient today 65 minutes. This includes reviewing records, evaluating the patient and coordinating care.  Medication Adjustments/Labs and Tests Ordered: Current medicines are reviewed at length with the patient today.  Concerns regarding medicines are outlined above.  No orders of the defined types were placed in this encounter.  No orders of the defined types were placed in this encounter.    Signed, Hilton Cork. Quentin Ore, MD, [facility], [facility] 01/30/2021 6:26 PM    Electrophysiology [place]

## 2021-01-30 NOTE — Patient Instructions (Addendum)
Medication Instructions:  Your physician recommends that you continue on your current medications as directed. Please refer to the Current Medication list given to you today. *If you need a refill on your cardiac medications before your next appointment, please call your pharmacy*  Lab Work: None ordered. If you have labs (blood work) drawn today and your tests are completely normal, you will receive your results only by: [place] (if you have MyChart) OR A paper copy in the mail If you have any lab test that is abnormal or we need to change your treatment, we will call you to review the results.  Testing/Procedures: None ordered.  Follow-Up: At [facility], you and your health needs are our priority.  As part of our continuing mission to provide you with exceptional heart care, we have created designated Provider Care Teams.  These Care Teams include your primary Cardiologist (physician) and Advanced Practice Providers (APPs -  Physician Assistants and Nurse Practitioners) who all work together to provide you with the care you need, when you need it.  Your next appointment:    If you would like to schedule an ablation please contact me: Sonia Baller RN  Cardiac Ablation Cardiac ablation is a procedure to destroy, or ablate, a small amount of heart tissue in very specific places. The heart has many electrical connections. Sometimes these connections are abnormal and can cause the heart to beat very fast or irregularly. Ablating some of the areas that cause problems can improve the heart's rhythm or return it to normal. Ablation may be done for people who: Have Wolff-Parkinson-White syndrome. Have fast heart rhythms (tachycardia). Have taken medicines for an abnormal heart rhythm (arrhythmia) that were not effective or caused side effects. Have a high-risk heartbeat that may be life-threatening. During the procedure, a small incision is made in the neck or the groin, and a long, thin  tube (catheter) is inserted into the incision and moved to the heart. Small devices (electrodes) on the tip of the catheter will send out electrical currents. A type of X-ray (fluoroscopy) will be used to help guide the catheter and to provide images of the heart. Tell a health care provider about: Any allergies you have. All medicines you are taking, including vitamins, herbs, eye drops, creams, and over-the-counter medicines. Any problems you or family members have had with anesthetic medicines. Any blood disorders you have. Any surgeries you have had. Any medical conditions you have, such as kidney failure. Whether you are pregnant or may be pregnant. What are the risks? Generally, this is a safe procedure. However, problems may occur, including: Infection. Bruising and bleeding at the catheter insertion site. Bleeding into the chest, especially into the sac that surrounds the heart. This is a serious complication. Stroke or blood clots. Damage to nearby structures or organs. Allergic reaction to medicines or dyes. Need for a permanent pacemaker if the normal electrical system is damaged. A pacemaker is a small computer that sends electrical signals to the heart and helps your heart beat normally. The procedure not being fully effective. This may not be recognized until months later. Repeat ablation procedures are sometimes done. What happens before the procedure? Medicines Ask your health care provider about: Changing or stopping your regular medicines. This is especially important if you are taking diabetes medicines or blood thinners. Taking medicines such as aspirin and ibuprofen. These medicines can thin your blood. Do not take these medicines unless your health care provider tells you to take them. Taking over-the-counter medicines,  vitamins, herbs, and supplements. General instructions Follow instructions from your health care provider about eating or drinking restrictions. Plan  to have someone take you home from the hospital or clinic. If you will be going home right after the procedure, plan to have someone with you for 24 hours. Ask your health care provider what steps will be taken to prevent infection. What happens during the procedure?  An IV will be inserted into one of your veins. You will be given a medicine to help you relax (sedative). The skin on your neck or groin will be numbed. An incision will be made in your neck or your groin. A needle will be inserted through the incision and into a large vein in your neck or groin. A catheter will be inserted into the needle and moved to your heart. Dye may be injected through the catheter to help your surgeon see the area of the heart that needs treatment. Electrical currents will be sent from the catheter to ablate heart tissue in desired areas. There are three types of energy that may be used to do this: Heat (radiofrequency energy). Laser energy. Extreme cold (cryoablation). When the tissue has been ablated, the catheter will be removed. Pressure will be held on the insertion area to prevent a lot of bleeding. A bandage (dressing) will be placed over the insertion area. The exact procedure may vary among health care providers and hospitals. What happens after the procedure? Your blood pressure, heart rate, breathing rate, and blood oxygen level will be monitored until you leave the hospital or clinic. Your insertion area will be monitored for bleeding. You will need to lie still for a few hours to ensure that you do not bleed from the insertion area. Do not drive for 24 hours or as long as told by your health care provider. Summary Cardiac ablation is a procedure to destroy, or ablate, a small amount of heart tissue using an electrical current. This procedure can improve the heart rhythm or return it to normal. Tell your health care provider about any medical conditions you may have and all medicines you are  taking to treat them. This is a safe procedure, but problems may occur. Problems may include infection, bruising, damage to nearby organs or structures, or allergic reactions to medicines. Follow your health care provider's instructions about eating and drinking before the procedure. You may also be told to change or stop some of your medicines. After the procedure, do not drive for 24 hours or as long as told by your health care provider. This information is not intended to replace advice given to you by your health care provider. Make sure you discuss any questions you have with your health care provider. Document Revised: 01/22/2019 Document Reviewed: 01/22/2019 Elsevier Patient Education  [place].

## 2021-02-23 DIAGNOSIS — L821 Other seborrheic keratosis: Secondary | ICD-10-CM | POA: Diagnosis not present

## 2021-02-23 DIAGNOSIS — L570 Actinic keratosis: Secondary | ICD-10-CM | POA: Diagnosis not present

## 2021-02-23 DIAGNOSIS — D3615 Benign neoplasm of peripheral nerves and autonomic nervous system of abdomen: Secondary | ICD-10-CM | POA: Diagnosis not present

## 2021-02-25 DIAGNOSIS — I480 Paroxysmal atrial fibrillation: Secondary | ICD-10-CM | POA: Diagnosis not present

## 2021-02-25 DIAGNOSIS — G4700 Insomnia, unspecified: Secondary | ICD-10-CM | POA: Diagnosis not present

## 2021-02-25 DIAGNOSIS — I10 Essential (primary) hypertension: Secondary | ICD-10-CM | POA: Diagnosis not present

## 2021-02-26 DIAGNOSIS — D3132 Benign neoplasm of left choroid: Secondary | ICD-10-CM | POA: Diagnosis not present

## 2021-02-26 DIAGNOSIS — H353221 Exudative age-related macular degeneration, left eye, with active choroidal neovascularization: Secondary | ICD-10-CM | POA: Diagnosis not present

## 2021-02-26 DIAGNOSIS — Z961 Presence of intraocular lens: Secondary | ICD-10-CM | POA: Diagnosis not present

## 2021-02-26 DIAGNOSIS — H353111 Nonexudative age-related macular degeneration, right eye, early dry stage: Secondary | ICD-10-CM | POA: Diagnosis not present

## 2021-03-11 ENCOUNTER — Other Ambulatory Visit: Payer: Self-pay | Admitting: Cardiology

## 2021-03-11 ENCOUNTER — Telehealth: Payer: Self-pay | Admitting: Cardiology

## 2021-03-11 DIAGNOSIS — R040 Epistaxis: Secondary | ICD-10-CM

## 2021-03-11 DIAGNOSIS — I4891 Unspecified atrial fibrillation: Secondary | ICD-10-CM | POA: Diagnosis not present

## 2021-03-11 DIAGNOSIS — I480 Paroxysmal atrial fibrillation: Secondary | ICD-10-CM

## 2021-03-11 NOTE — Telephone Encounter (Signed)
Spoke with Dr. Quentin Ore regarding moving forward with Watchman implant. He was seen in consultation 01/30/2021 and wished to discuss with his wife's cardiologist at [facility]. After speaking to the patient today, he and his wife have many questions about proceeding in regards to timing, along with possible ablation questions and and wish to speak with Dr. Quentin Ore further prior to scheduling pre-procedure/ Watchman CT scans. I have made an appointment 03/26/21 with Dr. Quentin Ore. Our next Watchman date will be 04/02/2021.   Kathyrn Drown NP-C Structural Heart Team  Pager: [phone]

## 2021-03-24 ENCOUNTER — Telehealth (HOSPITAL_COMMUNITY): Payer: Self-pay | Admitting: Emergency Medicine

## 2021-03-24 DIAGNOSIS — I480 Paroxysmal atrial fibrillation: Secondary | ICD-10-CM

## 2021-03-24 NOTE — Telephone Encounter (Signed)
Reaching out to patient to offer assistance regarding upcoming cardiac imaging study; pt verbalizes understanding of appt date/time, parking situation and where to check in, pre-test NPO status and medications ordered, and verified current allergies; name and call back number provided for further questions should they arise Wayne Bond RN Navigator Cardiac Imaging [facility] [phone] office [phone] cell  Reminded to get BMP prior to scan Arrival 1030a Denies iv issues  Daily meds per usual

## 2021-03-26 ENCOUNTER — Other Ambulatory Visit: Payer: Medicare Other | Admitting: *Deleted

## 2021-03-26 ENCOUNTER — Other Ambulatory Visit: Payer: Self-pay

## 2021-03-26 ENCOUNTER — Ambulatory Visit (INDEPENDENT_AMBULATORY_CARE_PROVIDER_SITE_OTHER): Payer: Medicare Other | Admitting: Cardiology

## 2021-03-26 ENCOUNTER — Encounter: Payer: Self-pay | Admitting: Cardiology

## 2021-03-26 VITALS — BP 114/86 | HR 112 | Ht 70.5 in | Wt 194.8 lb

## 2021-03-26 DIAGNOSIS — I480 Paroxysmal atrial fibrillation: Secondary | ICD-10-CM | POA: Diagnosis not present

## 2021-03-26 DIAGNOSIS — R040 Epistaxis: Secondary | ICD-10-CM

## 2021-03-26 DIAGNOSIS — I10 Essential (primary) hypertension: Secondary | ICD-10-CM | POA: Diagnosis not present

## 2021-03-26 LAB — BASIC METABOLIC PANEL
BUN/Creatinine Ratio: 15 (ref 10–24)
BUN: 13 mg/dL (ref 8–27)
CO2: 23 mmol/L (ref 20–29)
Calcium: 9.6 mg/dL (ref 8.6–10.2)
Chloride: 105 mmol/L (ref 96–106)
Creatinine, Ser: 0.87 mg/dL (ref 0.76–1.27)
Glucose: 118 mg/dL — ABNORMAL HIGH (ref 70–99)
Potassium: 5 mmol/L (ref 3.5–5.2)
Sodium: 142 mmol/L (ref 134–144)
eGFR: 89 mL/min/{1.73_m2} (ref >59)

## 2021-03-26 NOTE — Progress Notes (Signed)
Electrophysiology Office Follow up Visit Note:    Date:  03/26/2021   ID:  Wayne Roberson, DOB 11-29-1945, MRN [id number]  PCP:  [name], MD  [facility] Cardiologist:  [name], MD  [facility] Electrophysiologist:  Vickie Epley, MD    Interval History:    Wayne Roberson is a 75 y.o. male who presents for a follow up visit. They were last seen in clinic 01/30/2021. The patient has AF and was originally referred to discuss AF management and potential LAAO.   Since we met, he discussed the procedures with Dr Wendi Maya at [facility].   Today he tells me that he continues to have intermittent fatigue.  He tells me that when he is fatigued he will use a pulse ox to check his heart rate and will be elevated.  He tells me that the pulse oximeter will also display a "erratic" heart rhythm.  No syncope or presyncope.  He continues to take Eliquis for stroke prophylaxis but is interested in a long-term stroke mitigation strategy that avoids long-term exposure to anticoagulation.  He is with his wife today in clinic who I have previously met.     Past Medical History:  Diagnosis Date   Arthritis    knees   COPD (chronic obstructive pulmonary disease) ([place])    NEWLY DX , NOW RX'D INHALER PRN , NOT HAD TO USE YET    Deep vein thrombosis (DVT) of calf muscle vein (HCC) 3 YEARS AGO   LEFT CALF , UNSURE HOW IT WAS TREATED    Dyspnea    exercise   Hypertension    Prostate cancer ([place])    Tinnitus    Trace cataracts     Past Surgical History:  Procedure Laterality Date   CATARACT EXTRACTION, BILATERAL  2018   PROSTATE BIOPSY     RADIOACTIVE SEED IMPLANT N/A 03/01/2014   Procedure: RADIOACTIVE SEED IMPLANT;  Surgeon: [name], MD;  Location: [facility];  Service: Urology;  Laterality: N/A;   TOTAL KNEE ARTHROPLASTY Right 05/15/2018   Procedure: TOTAL KNEE ARTHROPLASTY;  Surgeon: [name], MD;  Location: [facility];  Service: Orthopedics;  Laterality: Right;   49mn   VENTRAL HERNIA REPAIR      Current Medications: Current Meds  Medication Sig   albuterol (PROVENTIL HFA;VENTOLIN HFA) 108 (90 Base) MCG/ACT inhaler Inhale 2 puffs into the lungs every 6 (six) hours as needed for wheezing or shortness of breath.   ELIQUIS 5 MG TABS tablet TAKE 1 TABLET TWICE A DAY   fluocinonide (LIDEX) 0.05 % external solution Apply 1 application topically daily as needed (rash).    fluticasone (FLONASE) 50 MCG/ACT nasal spray Place 2 sprays into both nostrils daily as needed for allergies or rhinitis.   LORazepam (ATIVAN) 0.5 MG tablet Take 0.5 mg by mouth daily as needed for sleep.    metoprolol tartrate (LOPRESSOR) 50 MG tablet Take 1 tablet (50 mg total) by mouth 2 (two) times daily.   telmisartan (MICARDIS) 80 MG tablet Take 80 mg by mouth daily.     Allergies:   Penicillins   Social History   Socioeconomic History   Marital status: Married    Spouse name: Not on file   Number of children: Not on file   Years of education: Not on file   Highest education level: Not on file  Occupational History   Not on file  Tobacco Use   Smoking status: Former    Packs/day: 2.00  Years: 42.00    Pack years: 84.00    Types: Cigarettes    Quit date: 03/29/2004    Years since quitting: 17.0   Smokeless tobacco: Never  Vaping Use   Vaping Use: Never used  Substance and Sexual Activity   Alcohol use: Yes    Alcohol/week: 12.0 standard drinks    Types: 10 Glasses of wine, 2 Standard drinks or equivalent per week    Comment: 2 glasses of wine evening of 05/14/2018   Drug use: No   Sexual activity: Never  Other Topics Concern   Not on file  Social History Narrative   Not on file   Social Determinants of Health   Financial Resource Strain: Not on file  Food Insecurity: Not on file  Transportation Needs: Not on file  Physical Activity: Not on file  Stress: Not on file  Social Connections: Not on file     Family History: The patient's family history  includes Heart failure in his father and mother. There is no history of Cancer.  ROS:   Please see the history of present illness.    All other systems reviewed and are negative.  EKGs/Labs/Other Studies Reviewed:    The following studies were reviewed today:   EKG:  The ekg ordered today demonstrates atrial fibrillation with rapid ventricular rate.  Ventricular rate is 112 bpm.  Recent Labs: No results found for requested labs within last 8760 hours.  Recent Lipid Panel No results found for: CHOL, TRIG, HDL, CHOLHDL, VLDL, LDLCALC, LDLDIRECT  Physical Exam:    VS:  BP 114/86    Pulse (!) 112    Ht 5' 10.5" (1.791 m)    Wt 194 lb 12.8 oz (88.4 kg)    SpO2 93%    BMI 27.56 kg/m     Wt Readings from Last 3 Encounters:  03/26/21 194 lb 12.8 oz (88.4 kg)  01/30/21 198 lb (89.8 kg)  12/26/20 198 lb (89.8 kg)     GEN:  Well nourished, well developed in no acute distress HEENT: Normal NECK: No JVD; No carotid bruits LYMPHATICS: No lymphadenopathy CARDIAC: Irregularly irregular, tachycardic, no murmurs, rubs, gallops RESPIRATORY:  Clear to auscultation without rales, wheezing or rhonchi  ABDOMEN: Soft, non-tender, non-distended MUSCULOSKELETAL:  No edema; No deformity  SKIN: Warm and dry NEUROLOGIC:  Alert and oriented x 3 PSYCHIATRIC:  Normal affect        ASSESSMENT:    1. Paroxysmal atrial fibrillation (HCC)   2. Nosebleed   3. Primary hypertension    PLAN:    In order of problems listed above:  #Paroxysmal atrial fibrillation Spent a significant mount of time during today's appointment discussing his diagnosis of paroxysmal atrial fibrillation.  Today, he is much clear that his episodes of atrial fibrillation are linked to his symptoms of fatigue.  He is interested in a rhythm control strategy.  We discussed antiarrhythmic drugs and catheter ablation during today's appointment.  He is interested in pursuing catheter ablation which I think is very reasonable.  I  discussed the procedure in detail including the risk, recovery and likelihood of success.  We discussed the potential need for future antiarrhythmic drug therapy or repeat ablation.  We discussed the need to separate the ablation and left atrial appendage occlusion procedures Bitely 6 to 8 weeks.  Given his history of nosebleeds and desire to avoid long-term exposure anticoagulation, we discussed left atrial appendage occlusion again during today's visit.  I discussed the procedure in detail including the  risk, recovery and likelihood of success.  I discussed the need for short-term anticoagulation.  He would like to proceed with scheduling.   -------------------  I have seen Wayne Roberson in the office today who is being considered for a Watchman left atrial appendage closure device. I believe they will benefit from this procedure given their history of atrial fibrillation, CHA2DS2-VASc score of 3 and unadjusted ischemic stroke rate of 3.2% per year. Unfortunately, the patient is not felt to be a long term anticoagulation candidate secondary to frequent nosebleeds and desire to avoid long-term exposure anticoagulation. The patient's chart has been reviewed and I feel that they would be a candidate for short term oral anticoagulation after Watchman implant.    It is my belief that after undergoing a LAA closure procedure, Wayne Roberson will not need long term anticoagulation which eliminates anticoagulation side effects and major bleeding risk.    Procedural risks for the Watchman implant have been reviewed with the patient including a 0.5% risk of stroke, <1% risk of perforation and <1% risk of device embolization.      The published clinical data on the safety and effectiveness of WATCHMAN include but are not limited to the following: - Holmes DR, Mechele Claude, Sick P et al. for the PROTECT AF Investigators. Percutaneous closure of the left atrial appendage versus warfarin therapy for prevention of  stroke in patients with atrial fibrillation: a randomised non-inferiority trial. Lancet 2009; 374: 534-42. Mechele Claude, Doshi SK, Abelardo Diesel D et al. on behalf of the PROTECT AF Investigators. Percutaneous Left Atrial Appendage Closure for Stroke Prophylaxis in Patients With Atrial Fibrillation 2.3-Year Follow-up of the PROTECT AF (Watchman Left Atrial Appendage System for Embolic Protection in Patients With Atrial Fibrillation) Trial. Circulation 2013; 127:720-729. - Alli O, Doshi S,  Kar S, Reddy VY, Sievert H et al. Quality of Life Assessment in the Randomized PROTECT AF (Percutaneous Closure of the Left Atrial Appendage Versus Warfarin Therapy for Prevention of Stroke in Patients With Atrial Fibrillation) Trial of Patients at Risk for Stroke With Nonvalvular Atrial Fibrillation. J Am Coll Cardiol 2013; 53:2992-4. Vertell Limber DR, Tarri Abernethy, Price M, [place], Sievert H, Doshi S, Huber K, Reddy V. Prospective randomized evaluation of the Watchman left atrial appendage Device in patients with atrial fibrillation versus long-term warfarin therapy; the PREVAIL trial. Journal of the [employer] of Cardiology, Vol. 4, No. 1, 2014, 1-11. - Kar S, Doshi SK, Sadhu A, Horton R, Osorio J et al. Primary outcome evaluation of a next-generation left atrial appendage closure device: results from the PINNACLE FLX trial. Circulation 2021;143(18)1754-1762.      After today's visit with the patient which was dedicated solely for shared decision making visit regarding LAA closure device, the patient would like to proceed with scheduling.  We will need a CT scan prior to the procedure time to assess the pulmonary veins and left atrial appendage.    Total time spent with patient today 45 minutes. This includes reviewing records, evaluating the patient and coordinating care.   Medication Adjustments/Labs and Tests Ordered: Current medicines are reviewed at length with the patient today.  Concerns regarding  medicines are outlined above.  Orders Placed This Encounter  Procedures   EKG 12-Lead   No orders of the defined types were placed in this encounter.    Signed, Lars Mage, MD, [facility], [facility] 03/26/2021 1:47 PM    Electrophysiology [facility]

## 2021-03-26 NOTE — Patient Instructions (Addendum)
Medication Instructions:  Your physician recommends that you continue on your current medications as directed. Please refer to the Current Medication list given to you today. *If you need a refill on your cardiac medications before your next appointment, please call your pharmacy*  Lab Work: You will get lab work today:  BMP  If you have labs (blood work) drawn today and your tests are completely normal, you will receive your results only by: [place] (if you have MyChart) OR A paper copy in the mail If you have any lab test that is abnormal or we need to change your treatment, we will call you to review the results.  Testing/Procedures: Your physician has requested that you have cardiac CT. Cardiac computed tomography (CT) is a painless test that uses an x-ray machine to take clear, detailed pictures of your heart.    Follow-Up: At [facility], you and your health needs are our priority.  As part of our continuing mission to provide you with exceptional heart care, we have created designated Provider Care Teams.  These Care Teams include your primary Cardiologist (physician) and Advanced Practice Providers (APPs -  Physician Assistants and Nurse Practitioners) who all work together to provide you with the care you need, when you need it.  SEE instruction letter  Cardiac Ablation Cardiac ablation is a procedure to destroy, or ablate, a small amount of heart tissue in very specific places. The heart has many electrical connections. Sometimes these connections are abnormal and can cause the heart to beat very fast or irregularly. Ablating some of the areas that cause problems can improve the heart's rhythm or return it to normal. Ablation may be done for people who: Have Wolff-Parkinson-White syndrome. Have fast heart rhythms (tachycardia). Have taken medicines for an abnormal heart rhythm (arrhythmia) that were not effective or caused side effects. Have a high-risk heartbeat that  may be life-threatening. During the procedure, a small incision is made in the neck or the groin, and a long, thin tube (catheter) is inserted into the incision and moved to the heart. Small devices (electrodes) on the tip of the catheter will send out electrical currents. A type of X-ray (fluoroscopy) will be used to help guide the catheter and to provide images of the heart. Tell a health care provider about: Any allergies you have. All medicines you are taking, including vitamins, herbs, eye drops, creams, and over-the-counter medicines. Any problems you or family members have had with anesthetic medicines. Any blood disorders you have. Any surgeries you have had. Any medical conditions you have, such as kidney failure. Whether you are pregnant or may be pregnant. What are the risks? Generally, this is a safe procedure. However, problems may occur, including: Infection. Bruising and bleeding at the catheter insertion site. Bleeding into the chest, especially into the sac that surrounds the heart. This is a serious complication. Stroke or blood clots. Damage to nearby structures or organs. Allergic reaction to medicines or dyes. Need for a permanent pacemaker if the normal electrical system is damaged. A pacemaker is a small computer that sends electrical signals to the heart and helps your heart beat normally. The procedure not being fully effective. This may not be recognized until months later. Repeat ablation procedures are sometimes done. What happens before the procedure? Medicines Ask your health care provider about: Changing or stopping your regular medicines. This is especially important if you are taking diabetes medicines or blood thinners. Taking medicines such as aspirin and ibuprofen. These medicines can  thin your blood. Do not take these medicines unless your health care provider tells you to take them. Taking over-the-counter medicines, vitamins, herbs, and  supplements. General instructions Follow instructions from your health care provider about eating or drinking restrictions. Plan to have someone take you home from the hospital or clinic. If you will be going home right after the procedure, plan to have someone with you for 24 hours. Ask your health care provider what steps will be taken to prevent infection. What happens during the procedure?  An IV will be inserted into one of your veins. You will be given a medicine to help you relax (sedative). The skin on your neck or groin will be numbed. An incision will be made in your neck or your groin. A needle will be inserted through the incision and into a large vein in your neck or groin. A catheter will be inserted into the needle and moved to your heart. Dye may be injected through the catheter to help your surgeon see the area of the heart that needs treatment. Electrical currents will be sent from the catheter to ablate heart tissue in desired areas. There are three types of energy that may be used to do this: Heat (radiofrequency energy). Laser energy. Extreme cold (cryoablation). When the tissue has been ablated, the catheter will be removed. Pressure will be held on the insertion area to prevent a lot of bleeding. A bandage (dressing) will be placed over the insertion area. The exact procedure may vary among health care providers and hospitals. What happens after the procedure? Your blood pressure, heart rate, breathing rate, and blood oxygen level will be monitored until you leave the hospital or clinic. Your insertion area will be monitored for bleeding. You will need to lie still for a few hours to ensure that you do not bleed from the insertion area. Do not drive for 24 hours or as long as told by your health care provider. Summary Cardiac ablation is a procedure to destroy, or ablate, a small amount of heart tissue using an electrical current. This procedure can improve the  heart rhythm or return it to normal. Tell your health care provider about any medical conditions you may have and all medicines you are taking to treat them. This is a safe procedure, but problems may occur. Problems may include infection, bruising, damage to nearby organs or structures, or allergic reactions to medicines. Follow your health care provider's instructions about eating and drinking before the procedure. You may also be told to change or stop some of your medicines. After the procedure, do not drive for 24 hours or as long as told by your health care provider. This information is not intended to replace advice given to you by your health care provider. Make sure you discuss any questions you have with your health care provider. Document Revised: 01/22/2019 Document Reviewed: 01/22/2019 Elsevier Patient Education  [place].

## 2021-03-27 ENCOUNTER — Ambulatory Visit (HOSPITAL_COMMUNITY)
Admission: RE | Admit: 2021-03-27 | Discharge: 2021-03-27 | Disposition: A | Discharge status: Home / Self Care | Payer: Medicare Other | Source: Ambulatory Visit | Attending: Cardiology | Admitting: Cardiology

## 2021-03-27 ENCOUNTER — Encounter (HOSPITAL_COMMUNITY): Payer: Self-pay

## 2021-03-27 DIAGNOSIS — I480 Paroxysmal atrial fibrillation: Secondary | ICD-10-CM

## 2021-03-27 DIAGNOSIS — R040 Epistaxis: Secondary | ICD-10-CM

## 2021-04-02 DIAGNOSIS — H353111 Nonexudative age-related macular degeneration, right eye, early dry stage: Secondary | ICD-10-CM | POA: Diagnosis not present

## 2021-04-02 DIAGNOSIS — H353221 Exudative age-related macular degeneration, left eye, with active choroidal neovascularization: Secondary | ICD-10-CM | POA: Diagnosis not present

## 2021-04-10 ENCOUNTER — Other Ambulatory Visit: Payer: Self-pay | Admitting: Cardiology

## 2021-04-10 ENCOUNTER — Other Ambulatory Visit: Payer: Self-pay | Admitting: Cardiovascular Disease

## 2021-04-11 ENCOUNTER — Other Ambulatory Visit: Payer: Self-pay | Admitting: Cardiology

## 2021-04-13 DIAGNOSIS — Z23 Encounter for immunization: Secondary | ICD-10-CM | POA: Diagnosis not present

## 2021-04-13 NOTE — Telephone Encounter (Signed)
Eliquis 5 mg refill request received. Patient is 76 years old, weight- 88.4 kg, Crea- 0.87 on 03/26/21, Diagnosis- PAF, and last seen by Dr. Quentin Ore on 03/26/21. Dose is appropriate based on dosing criteria. Will send in refill to requested pharmacy.

## 2021-05-14 DIAGNOSIS — D3132 Benign neoplasm of left choroid: Secondary | ICD-10-CM | POA: Diagnosis not present

## 2021-05-14 DIAGNOSIS — H353111 Nonexudative age-related macular degeneration, right eye, early dry stage: Secondary | ICD-10-CM | POA: Diagnosis not present

## 2021-05-14 DIAGNOSIS — H35352 Cystoid macular degeneration, left eye: Secondary | ICD-10-CM | POA: Diagnosis not present

## 2021-05-14 DIAGNOSIS — Z961 Presence of intraocular lens: Secondary | ICD-10-CM | POA: Diagnosis not present

## 2021-05-14 DIAGNOSIS — H353121 Nonexudative age-related macular degeneration, left eye, early dry stage: Secondary | ICD-10-CM | POA: Diagnosis not present

## 2021-05-21 DIAGNOSIS — H40013 Open angle with borderline findings, low risk, bilateral: Secondary | ICD-10-CM | POA: Diagnosis not present

## 2021-05-21 DIAGNOSIS — H3581 Retinal edema: Secondary | ICD-10-CM | POA: Diagnosis not present

## 2021-06-11 DIAGNOSIS — Z86010 Personal history of colonic polyps: Secondary | ICD-10-CM | POA: Diagnosis not present

## 2021-06-11 DIAGNOSIS — I480 Paroxysmal atrial fibrillation: Secondary | ICD-10-CM | POA: Diagnosis not present

## 2021-06-11 DIAGNOSIS — I10 Essential (primary) hypertension: Secondary | ICD-10-CM | POA: Diagnosis not present

## 2021-06-11 DIAGNOSIS — G4700 Insomnia, unspecified: Secondary | ICD-10-CM | POA: Diagnosis not present

## 2021-06-11 DIAGNOSIS — Z8546 Personal history of malignant neoplasm of prostate: Secondary | ICD-10-CM | POA: Diagnosis not present

## 2021-06-11 DIAGNOSIS — Z1389 Encounter for screening for other disorder: Secondary | ICD-10-CM | POA: Diagnosis not present

## 2021-06-11 DIAGNOSIS — Z0000 Encounter for general adult medical examination without abnormal findings: Secondary | ICD-10-CM | POA: Diagnosis not present

## 2021-06-12 ENCOUNTER — Encounter: Payer: Self-pay | Admitting: Cardiology

## 2021-06-18 NOTE — Pre-Procedure Instructions (Signed)
Instructed patient on the following items: Arrival time 0530 Nothing to eat or drink after midnight No meds AM of procedure Responsible person to drive you home and stay with you for 24 hrs  Have you missed any doses of anti-coagulant Eliquis- hasn't missed any doses    

## 2021-06-18 NOTE — Anesthesia Preprocedure Evaluation (Addendum)
Anesthesia Evaluation  ?Patient identified by MRN, date of birth, ID band ?Patient awake ? ? ? ?Reviewed: ?Allergy & Precautions, NPO status , Patient's Chart, lab work & pertinent test results ? ?History of Anesthesia Complications ?Negative for: history of anesthetic complications ? ?Airway ?Mallampati: II ? ?TM Distance: >3 FB ?Neck ROM: Full ? ? ? Dental ? ?(+) Dental Advisory Given ?  ?Pulmonary ?COPD, former smoker,  ?  ?breath sounds clear to auscultation ? ? ? ? ? ? Cardiovascular ?hypertension, Pt. on medications and Pt. on home beta blockers ?(-) angina+ dysrhythmias Atrial Fibrillation  ?Rhythm:Irregular Rate:Normal ? ?07/2020 Stress: EF 58%, no ST deviation ?04/2020 ECHO: EF 60-65%, normal LVF, Grade 1 DD, trivial MR ?  ?Neuro/Psych ?negative neurological ROS ?   ? GI/Hepatic ?negative GI ROS, Neg liver ROS,   ?Endo/Other  ?negative endocrine ROS ? Renal/GU ?negative Renal ROS  ? ?Prostate cancer ? ?  ?Musculoskeletal ? ?(+) Arthritis ,  ? Abdominal ?  ?Peds ? Hematology ?eliquis   ?Anesthesia Other Findings ? ? Reproductive/Obstetrics ? ?  ? ? ? ? ? ? ? ? ? ? ? ? ? ?  ?  ? ? ? ? ? ? ?Anesthesia Physical ?Anesthesia Plan ? ?ASA: 3 ? ?Anesthesia Plan: General  ? ?Post-op Pain Management: Tylenol PO (pre-op)* and Minimal or no pain anticipated  ? ?Induction: Intravenous ? ?PONV Risk Score and Plan: 2 and Ondansetron and Dexamethasone ? ?Airway Management Planned: Oral ETT ? ?Additional Equipment: None ? ?Intra-op Plan:  ? ?Post-operative Plan: Extubation in OR ? ?Informed Consent: I have reviewed the patients History and Physical, chart, labs and discussed the procedure including the risks, benefits and alternatives for the proposed anesthesia with the patient or authorized representative who has indicated his/her understanding and acceptance.  ? ? ? ?Dental advisory given ? ?Plan Discussed with: CRNA and Surgeon ? ?Anesthesia Plan Comments:   ? ? ? ? ? ?Anesthesia Quick  Evaluation ? ?

## 2021-06-19 ENCOUNTER — Ambulatory Visit (HOSPITAL_COMMUNITY)
Admission: RE | Admit: 2021-06-19 | Discharge: 2021-06-19 | Disposition: A | Discharge status: Home / Self Care | Payer: Medicare Other | Attending: Cardiology | Admitting: Cardiology

## 2021-06-19 ENCOUNTER — Ambulatory Visit (HOSPITAL_BASED_OUTPATIENT_CLINIC_OR_DEPARTMENT_OTHER): Payer: Medicare Other | Admitting: Anesthesiology

## 2021-06-19 ENCOUNTER — Ambulatory Visit (HOSPITAL_COMMUNITY): Payer: Medicare Other | Admitting: Anesthesiology

## 2021-06-19 ENCOUNTER — Encounter (HOSPITAL_COMMUNITY): Payer: Self-pay | Admitting: Cardiology

## 2021-06-19 ENCOUNTER — Other Ambulatory Visit: Payer: Self-pay

## 2021-06-19 ENCOUNTER — Encounter (HOSPITAL_COMMUNITY)
Admission: RE | Disposition: A | Discharge status: Home / Self Care | Payer: Self-pay | Source: Home / Self Care | Attending: Cardiology

## 2021-06-19 DIAGNOSIS — Z7901 Long term (current) use of anticoagulants: Secondary | ICD-10-CM | POA: Insufficient documentation

## 2021-06-19 DIAGNOSIS — I10 Essential (primary) hypertension: Secondary | ICD-10-CM | POA: Insufficient documentation

## 2021-06-19 DIAGNOSIS — M1990 Unspecified osteoarthritis, unspecified site: Secondary | ICD-10-CM | POA: Diagnosis not present

## 2021-06-19 DIAGNOSIS — Z87891 Personal history of nicotine dependence: Secondary | ICD-10-CM | POA: Insufficient documentation

## 2021-06-19 DIAGNOSIS — R040 Epistaxis: Secondary | ICD-10-CM | POA: Insufficient documentation

## 2021-06-19 DIAGNOSIS — J449 Chronic obstructive pulmonary disease, unspecified: Secondary | ICD-10-CM | POA: Diagnosis not present

## 2021-06-19 DIAGNOSIS — I483 Typical atrial flutter: Secondary | ICD-10-CM | POA: Insufficient documentation

## 2021-06-19 DIAGNOSIS — I4819 Other persistent atrial fibrillation: Secondary | ICD-10-CM | POA: Insufficient documentation

## 2021-06-19 DIAGNOSIS — Z79899 Other long term (current) drug therapy: Secondary | ICD-10-CM | POA: Diagnosis not present

## 2021-06-19 DIAGNOSIS — I4891 Unspecified atrial fibrillation: Secondary | ICD-10-CM | POA: Diagnosis not present

## 2021-06-19 HISTORY — PX: ATRIAL FIBRILLATION ABLATION: EP1191

## 2021-06-19 LAB — CBC
HCT: 42.7 % (ref 39.0–52.0)
Hemoglobin: 14.5 g/dL (ref 13.0–17.0)
MCH: 30.6 pg (ref 26.0–34.0)
MCHC: 34 g/dL (ref 30.0–36.0)
MCV: 90.1 fL (ref 80.0–100.0)
Platelets: 190 10*3/uL (ref 150–400)
RBC: 4.74 MIL/uL (ref 4.22–5.81)
RDW: 13.9 % (ref 11.5–15.5)
WBC: 7.5 10*3/uL (ref 4.0–10.5)
nRBC: 0 % (ref 0.0–0.2)

## 2021-06-19 LAB — BASIC METABOLIC PANEL WITH GFR
Anion gap: 7 (ref 5–15)
BUN: 15 mg/dL (ref 8–23)
CO2: 24 mmol/L (ref 22–32)
Calcium: 8.7 mg/dL — ABNORMAL LOW (ref 8.9–10.3)
Chloride: 111 mmol/L (ref 98–111)
Creatinine, Ser: 0.79 mg/dL (ref 0.61–1.24)
GFR, Estimated: >60 mL/min
Glucose, Bld: 128 mg/dL — ABNORMAL HIGH (ref 70–99)
Potassium: 4.2 mmol/L (ref 3.5–5.1)
Sodium: 142 mmol/L (ref 135–145)

## 2021-06-19 LAB — POCT ACTIVATED CLOTTING TIME
Activated Clotting Time: 263 seconds
Activated Clotting Time: 317 seconds

## 2021-06-19 SURGERY — ATRIAL FIBRILLATION ABLATION
Anesthesia: General

## 2021-06-19 MED ORDER — PANTOPRAZOLE SODIUM 40 MG PO TBEC: 40.0000 mg | DELAYED_RELEASE_TABLET | Freq: Every day | ORAL | 0 refills | Status: DC

## 2021-06-19 MED ORDER — HEPARIN (PORCINE) IN NACL 1000-0.9 UT/500ML-% IV SOLN
INTRAVENOUS | Status: DC | PRN
  Administered 2021-06-19 (×4): 500 mL

## 2021-06-19 MED ORDER — ACETAMINOPHEN 500 MG PO TABS
1000.0000 mg | ORAL_TABLET | Freq: Once | ORAL | Status: AC
  Administered 2021-06-19: 1000 mg via ORAL
  Filled 2021-06-19: qty 2

## 2021-06-19 MED ORDER — HEPARIN (PORCINE) IN NACL 1000-0.9 UT/500ML-% IV SOLN
INTRAVENOUS | Status: AC
  Filled 2021-06-19: qty 500

## 2021-06-19 MED ORDER — SODIUM CHLORIDE 0.9 % IV SOLN: 250.0000 mL | INTRAVENOUS | Status: DC | PRN

## 2021-06-19 MED ORDER — SODIUM CHLORIDE 0.9 % IV SOLN: INTRAVENOUS | Status: DC

## 2021-06-19 MED ORDER — PANTOPRAZOLE SODIUM 40 MG PO TBEC
40.0000 mg | DELAYED_RELEASE_TABLET | Freq: Every day | ORAL | Status: DC
  Administered 2021-06-19: 40 mg via ORAL
  Filled 2021-06-19: qty 1

## 2021-06-19 MED ORDER — PHENYLEPHRINE HCL-NACL 20-0.9 MG/250ML-% IV SOLN
INTRAVENOUS | Status: DC | PRN
  Administered 2021-06-19: 25 ug/min via INTRAVENOUS

## 2021-06-19 MED ORDER — HEPARIN SODIUM (PORCINE) 1000 UNIT/ML IJ SOLN
INTRAMUSCULAR | Status: AC
  Filled 2021-06-19: qty 10

## 2021-06-19 MED ORDER — HEPARIN SODIUM (PORCINE) 1000 UNIT/ML IJ SOLN
INTRAMUSCULAR | Status: DC | PRN
  Administered 2021-06-19: 1000 [IU] via INTRAVENOUS

## 2021-06-19 MED ORDER — SODIUM CHLORIDE 0.9% FLUSH: 3.0000 mL | INTRAVENOUS | Status: DC | PRN

## 2021-06-19 MED ORDER — ISOPROTERENOL HCL 0.2 MG/ML IJ SOLN
INTRAMUSCULAR | Status: AC
  Filled 2021-06-19: qty 5

## 2021-06-19 MED ORDER — ONDANSETRON HCL 4 MG/2ML IJ SOLN: 4.0000 mg | Freq: Four times a day (QID) | INTRAMUSCULAR | Status: DC | PRN

## 2021-06-19 MED ORDER — ONDANSETRON HCL 4 MG/2ML IJ SOLN
INTRAMUSCULAR | Status: DC | PRN
  Administered 2021-06-19: 4 mg via INTRAVENOUS

## 2021-06-19 MED ORDER — APIXABAN 5 MG PO TABS
5.0000 mg | ORAL_TABLET | Freq: Two times a day (BID) | ORAL | Status: DC
  Administered 2021-06-19: 5 mg via ORAL
  Filled 2021-06-19: qty 1

## 2021-06-19 MED ORDER — LIDOCAINE 2% (20 MG/ML) 5 ML SYRINGE
INTRAMUSCULAR | Status: DC | PRN
Start: 2021-06-19 — End: 2021-06-19
  Administered 2021-06-19: 20 mg via INTRAVENOUS

## 2021-06-19 MED ORDER — COLCHICINE 0.6 MG PO TABS: 0.6000 mg | ORAL_TABLET | Freq: Two times a day (BID) | ORAL | 0 refills | Status: DC

## 2021-06-19 MED ORDER — ROCURONIUM BROMIDE 10 MG/ML (PF) SYRINGE
PREFILLED_SYRINGE | INTRAVENOUS | Status: DC | PRN
  Administered 2021-06-19: 70 mg via INTRAVENOUS
  Administered 2021-06-19: 30 mg via INTRAVENOUS

## 2021-06-19 MED ORDER — HEPARIN SODIUM (PORCINE) 1000 UNIT/ML IJ SOLN
INTRAMUSCULAR | Status: DC | PRN
  Administered 2021-06-19: 14000 [IU] via INTRAVENOUS
  Administered 2021-06-19: 7000 [IU] via INTRAVENOUS
  Administered 2021-06-19: 5000 [IU] via INTRAVENOUS

## 2021-06-19 MED ORDER — COLCHICINE 0.6 MG PO TABS
0.6000 mg | ORAL_TABLET | Freq: Two times a day (BID) | ORAL | Status: DC
  Administered 2021-06-19: 0.6 mg via ORAL
  Filled 2021-06-19: qty 1

## 2021-06-19 MED ORDER — FENTANYL CITRATE (PF) 100 MCG/2ML IJ SOLN
INTRAMUSCULAR | Status: DC | PRN
  Administered 2021-06-19: 100 ug via INTRAVENOUS

## 2021-06-19 MED ORDER — SODIUM CHLORIDE 0.9% FLUSH: 3.0000 mL | Freq: Two times a day (BID) | INTRAVENOUS | Status: DC

## 2021-06-19 MED ORDER — SUGAMMADEX SODIUM 200 MG/2ML IV SOLN
INTRAVENOUS | Status: DC | PRN
Start: 2021-06-19 — End: 2021-06-19
  Administered 2021-06-19: 200 mg via INTRAVENOUS

## 2021-06-19 MED ORDER — PHENYLEPHRINE 40 MCG/ML (10ML) SYRINGE FOR IV PUSH (FOR BLOOD PRESSURE SUPPORT)
PREFILLED_SYRINGE | INTRAVENOUS | Status: DC | PRN
Start: 2021-06-19 — End: 2021-06-19
  Administered 2021-06-19 (×2): 80 ug via INTRAVENOUS

## 2021-06-19 MED ORDER — DEXAMETHASONE SODIUM PHOSPHATE 10 MG/ML IJ SOLN
INTRAMUSCULAR | Status: DC | PRN
  Administered 2021-06-19: 10 mg via INTRAVENOUS

## 2021-06-19 MED ORDER — ISOPROTERENOL HCL 0.2 MG/ML IJ SOLN
INTRAVENOUS | Status: DC | PRN
  Administered 2021-06-19: 2 ug/min via INTRAVENOUS

## 2021-06-19 MED ORDER — PROPOFOL 10 MG/ML IV BOLUS
INTRAVENOUS | Status: DC | PRN
  Administered 2021-06-19: 120 mg via INTRAVENOUS

## 2021-06-19 MED ORDER — PROTAMINE SULFATE 10 MG/ML IV SOLN
INTRAVENOUS | Status: DC | PRN
Start: 2021-06-19 — End: 2021-06-19
  Administered 2021-06-19: 35 mg via INTRAVENOUS

## 2021-06-19 MED ORDER — ACETAMINOPHEN 325 MG PO TABS
650.0000 mg | ORAL_TABLET | ORAL | Status: DC | PRN
  Filled 2021-06-19: qty 2

## 2021-06-19 SURGICAL SUPPLY — 18 items
BAG SNAP BAND KOVER 36X36 (MISCELLANEOUS) ×1 IMPLANT
CATH ACUNAV REPROCESSED (CATHETERS) ×1 IMPLANT
CATH OCTARAY 2.0 F 3-3-3-3-3 (CATHETERS) ×1 IMPLANT
CATH S CIRCA THERM PROBE 10F (CATHETERS) ×1 IMPLANT
CATH SMTCH THERMOCOOL SF DF (CATHETERS) ×1 IMPLANT
CATH WEB BI DIR CSDF CRV REPRO (CATHETERS) ×1 IMPLANT
CLOSURE PERCLOSE PROSTYLE (VASCULAR PRODUCTS) ×3 IMPLANT
COVER SWIFTLINK CONNECTOR (BAG) ×2 IMPLANT
PACK EP LATEX FREE (CUSTOM PROCEDURE TRAY) ×2
PACK EP LF (CUSTOM PROCEDURE TRAY) ×1 IMPLANT
PAD DEFIB RADIO PHYSIO CONN (PAD) ×2 IMPLANT
PATCH CARTO3 (PAD) ×1 IMPLANT
SHEATH BAYLIS TRANSSEPTAL 98CM (NEEDLE) ×1 IMPLANT
SHEATH CARTO VIZIGO SM CVD (SHEATH) ×1 IMPLANT
SHEATH PINNACLE 8F 10CM (SHEATH) ×2 IMPLANT
SHEATH PINNACLE 9F 10CM (SHEATH) ×1 IMPLANT
SHEATH PROBE COVER 6X72 (BAG) ×1 IMPLANT
TUBING SMART ABLATE COOLFLOW (TUBING) ×1 IMPLANT

## 2021-06-19 NOTE — Anesthesia Postprocedure Evaluation (Signed)
Anesthesia Post Note ? ?Patient: Wayne Roberson ? ?Procedure(s) Performed: ATRIAL FIBRILLATION ABLATION ? ?  ? ?Patient location during evaluation: Phase II ?Anesthesia Type: General ?Level of consciousness: awake and alert, oriented and patient cooperative ?Pain management: pain level controlled ?Vital Signs Assessment: post-procedure vital signs reviewed and stable ?Respiratory status: spontaneous breathing, nonlabored ventilation and respiratory function stable ?Cardiovascular status: blood pressure returned to baseline and stable ?Postop Assessment: no apparent nausea or vomiting, adequate PO intake and able to ambulate ?Anesthetic complications: no ? ? ?There were no known notable events for this encounter. ? ?Last Vitals:  ?Vitals:  ? 06/19/21 1020 06/19/21 1025  ?BP: 131/65 125/74  ?Pulse: 63 63  ?Resp: 15 14  ?Temp:  36.8 ?C  ?SpO2: 96% 95%  ?  ?Last Pain:  ?Vitals:  ? 06/19/21 1025  ?TempSrc: Temporal  ?PainSc:   ? ? ?  ?  ?  ?  ?  ?  ? ?Conswella Bruney,E. Scottie Metayer ? ? ? ? ?

## 2021-06-19 NOTE — H&P (Addendum)
?Electrophysiology Office Follow up Visit Note:   ?  ?Date:  06/19/2021  ?  ?ID:  Wayne Roberson, DOB [birth year], MRN [id number] ?  ?PCP:  [name], MD           ?[facility] Cardiologist:  [name], MD  ?[facility] Electrophysiologist:  Vickie Epley, MD  ?  ?  ?Interval History:   ?  ?Wayne Roberson is a 76 y.o. male who presents for a follow up visit. They were last seen in clinic 01/30/2021. The patient has AF and was originally referred to discuss AF management and potential LAAO.  ?  ?Since we met, he discussed the procedures with Dr Wendi Maya at [facility].  ?  ?Today he tells me that he continues to have intermittent fatigue.  He tells me that when he is fatigued he will use a pulse ox to check his heart rate and will be elevated.  He tells me that the pulse oximeter will also display a "erratic" heart rhythm.  No syncope or presyncope.  He continues to take Eliquis for stroke prophylaxis but is interested in a long-term stroke mitigation strategy that avoids long-term exposure to anticoagulation. ?  ?He is with his wife today in clinic who I have previously met. ?  ?Today he is doing well.  ?  ?Objective  ?  ?  ?    ?Past Medical History:  ?Diagnosis Date  ? Arthritis    ?  knees  ? COPD (chronic obstructive pulmonary disease) ([place])    ?  NEWLY DX , NOW RX'D INHALER PRN , NOT HAD TO USE YET   ? Deep vein thrombosis (DVT) of calf muscle vein (HCC) 3 YEARS AGO  ?  LEFT CALF , UNSURE HOW IT WAS TREATED   ? Dyspnea    ?  exercise  ? Hypertension    ? Prostate cancer ([place])    ? Tinnitus    ? Trace cataracts    ?  ?  ?     ?Past Surgical History:  ?Procedure Laterality Date  ? CATARACT EXTRACTION, BILATERAL   2018  ? PROSTATE BIOPSY      ? RADIOACTIVE SEED IMPLANT N/A 03/01/2014  ?  Procedure: RADIOACTIVE SEED IMPLANT;  Surgeon: [name], MD;  Location[facility];  Service: Urology;  Laterality: N/A;  ? TOTAL KNEE ARTHROPLASTY Right 05/15/2018  ?  Procedure: TOTAL KNEE ARTHROPLASTY;   Surgeon: [name], MD;  Location: [facility];  Service: Orthopedics;  Laterality: Right;  72mn  ? VENTRAL HERNIA REPAIR      ?  ?  ?Current Medications: ?Active Medications  ?    ?Current Meds  ?Medication Sig  ? albuterol (PROVENTIL HFA;VENTOLIN HFA) 108 (90 Base) MCG/ACT inhaler Inhale 2 puffs into the lungs every 6 (six) hours as needed for wheezing or shortness of breath.  ? ELIQUIS 5 MG TABS tablet TAKE 1 TABLET TWICE A DAY  ? fluocinonide (LIDEX) 0.05 % external solution Apply 1 application topically daily as needed (rash).   ? fluticasone (FLONASE) 50 MCG/ACT nasal spray Place 2 sprays into both nostrils daily as needed for allergies or rhinitis.  ? LORazepam (ATIVAN) 0.5 MG tablet Take 0.5 mg by mouth daily as needed for sleep.   ? metoprolol tartrate (LOPRESSOR) 50 MG tablet Take 1 tablet (50 mg total) by mouth 2 (two) times daily.  ? telmisartan (MICARDIS) 80 MG tablet Take 80 mg by mouth daily.  ?  ?  ?  ?  Allergies:   Penicillins  ?  ?Social History  ?  ?     ?Socioeconomic History  ? Marital status: Married  ?    Spouse name: Not on file  ? Number of children: Not on file  ? Years of education: Not on file  ? Highest education level: Not on file  ?Occupational History  ? Not on file  ?Tobacco Use  ? Smoking status: Former  ?    Packs/day: 2.00  ?    Years: 42.00  ?    Pack years: 84.00  ?    Types: Cigarettes  ?    Quit date: 03/29/2004  ?    Years since quitting: 17.0  ? Smokeless tobacco: Never  ?Vaping Use  ? Vaping Use: Never used  ?Substance and Sexual Activity  ? Alcohol use: Yes  ?    Alcohol/week: 12.0 standard drinks  ?    Types: 10 Glasses of wine, 2 Standard drinks or equivalent per week  ?    Comment: 2 glasses of wine evening of 05/14/2018  ? Drug use: No  ? Sexual activity: Never  ?Other Topics Concern  ? Not on file  ?Social History Narrative  ? Not on file  ?  ?Social Determinants of Health  ?  ?Financial Resource Strain: Not on file  ?Food Insecurity: Not on file  ?Transportation Needs:  Not on file  ?Physical Activity: Not on file  ?Stress: Not on file  ?Social Connections: Not on file  ?  ?  ?Family History: ?The patient's family history includes Heart failure in his father and mother. There is no history of Cancer. ?  ?ROS:   ?Please see the history of present illness.    ?All other systems reviewed and are negative. ?  ?EKGs/Labs/Other Studies Reviewed:   ?  ?The following studies were reviewed today: ?  ?  ?EKG:  The ekg ordered today demonstrates atrial fibrillation with rapid ventricular rate.  Ventricular rate is 112 bpm. ?  ?Recent Labs: ?No results found for requested labs within last 8760 hours.  ?Recent Lipid Panel ?Labs (Brief)  ?No results found for: CHOL, TRIG, HDL, CHOLHDL, VLDL, LDLCALC, LDLDIRECT  ? ?  ?Physical Exam:   ?  ?VS:  BP 114/86   Pulse (!) 112   Ht 5' 10.5" (1.791 m)   Wt 194 lb 12.8 oz (88.4 kg)   SpO2 93%   BMI 27.56 kg/m?    ?  ?   ?Wt Readings from Last 3 Encounters:  ?03/26/21 194 lb 12.8 oz (88.4 kg)  ?01/30/21 198 lb (89.8 kg)  ?12/26/20 198 lb (89.8 kg)  ?  ?  ?GEN:  Well nourished, well developed in no acute distress ?HEENT: Normal ?NECK: No JVD; No carotid bruits ?LYMPHATICS: No lymphadenopathy ?CARDIAC: Irregularly irregular, tachycardic, no murmurs, rubs, gallops ?RESPIRATORY:  Clear to auscultation without rales, wheezing or rhonchi  ?ABDOMEN: Soft, non-tender, non-distended ?MUSCULOSKELETAL:  No edema; No deformity  ?SKIN: Warm and dry ?NEUROLOGIC:  Alert and oriented x 3 ?PSYCHIATRIC:  Normal affect  ?  ?  ?  ?  ?  ?Assessment ?  ?  ?ASSESSMENT:   ?  ?1. Paroxysmal atrial fibrillation (HCC)   ?2. Nosebleed   ?3. Primary hypertension   ?  ?PLAN:   ?  ?In order of problems listed above: ?  ?#Persistent atrial fibrillation/flutter ?Spent a significant mount of time during today's appointment discussing his diagnosis of paroxysmal atrial fibrillation.  Today, he is much clear  that his episodes of atrial fibrillation are linked to his symptoms of fatigue.   He is interested in a rhythm control strategy.  We discussed antiarrhythmic drugs and catheter ablation during today's appointment.  He is interested in pursuing catheter ablation which I think is very reasonable.  I discussed the procedure in detail including the risk, recovery and likelihood of success.  We discussed the potential need for future antiarrhythmic drug therapy or repeat ablation.  We discussed the need to separate the ablation and left atrial appendage occlusion procedures Bitely 6 to 8 weeks. ?  ?Given his history of nosebleeds and desire to avoid long-term exposure anticoagulation, we discussed left atrial appendage occlusion again during today's visit.  I discussed the procedure in detail including the risk, recovery and likelihood of success.  I discussed the need for short-term anticoagulation.  He would like to proceed with scheduling. ?  ?  ?   ?Signed, ?Lars Mage, MD, [facility], [facility] ?03/26/2021 1:47 PM    ?Electrophysiology ?[place] ?  ? ?----------------------------------------- ? ?I have seen, examined the patient, and reviewed the above assessment and plan.   ? ?Plan for PVI today. ? ? ?Vickie Epley, MD ?06/19/2021 ?7:19 AM ? ?

## 2021-06-19 NOTE — Anesthesia Procedure Notes (Signed)
Procedure Name: Intubation ?Date/Time: 06/19/2021 7:41 AM ?Performed by: Dorann Lodge, CRNA ?Pre-anesthesia Checklist: Patient identified, Emergency Drugs available, Suction available and Patient being monitored ?Patient Re-evaluated:Patient Re-evaluated prior to induction ?Oxygen Delivery Method: Circle System Utilized ?Preoxygenation: Pre-oxygenation with 100% oxygen ?Induction Type: IV induction ?Ventilation: Mask ventilation without difficulty ?Laryngoscope Size: Mac and 4 ?Grade View: Grade I ?Tube type: Oral ?Tube size: 7.5 mm ?Number of attempts: 1 ?Airway Equipment and Method: Stylet ?Placement Confirmation: ETT inserted through vocal cords under direct vision, positive ETCO2 and breath sounds checked- equal and bilateral ?Secured at: 23 cm ?Tube secured with: Tape ?Dental Injury: Teeth and Oropharynx as per pre-operative assessment  ? ? ? ? ?

## 2021-06-19 NOTE — Transfer of Care (Signed)
Immediate Anesthesia Transfer of Care Note ? ?Patient: Wayne Roberson ? ?Procedure(s) Performed: ATRIAL FIBRILLATION ABLATION ? ?Patient Location: Cath Lab ? ?Anesthesia Type:General ? ?Level of Consciousness: awake and alert  ? ?Airway & Oxygen Therapy: Patient Spontanous Breathing and Patient connected to nasal cannula oxygen ? ?Post-op Assessment: Report given to RN and Post -op Vital signs reviewed and stable ? ?Post vital signs: Reviewed and stable ? ?Last Vitals:  ?Vitals Value Taken Time  ?BP 122/71 06/19/21 0955  ?Temp 36.2 ?C 06/19/21 0955  ?Pulse 70 06/19/21 0955  ?Resp 18 06/19/21 0955  ?SpO2 95 % 06/19/21 0955  ?Vitals shown include unvalidated device data. ? ?Last Pain:  ?Vitals:  ? 06/19/21 0955  ?TempSrc: Temporal  ?PainSc: 2   ?   ? ?  ? ?Complications: There were no known notable events for this encounter. ?

## 2021-06-19 NOTE — Discharge Instructions (Signed)

## 2021-06-22 ENCOUNTER — Encounter (HOSPITAL_COMMUNITY): Payer: Self-pay | Admitting: Cardiology

## 2021-06-22 ENCOUNTER — Other Ambulatory Visit: Payer: Self-pay

## 2021-06-22 DIAGNOSIS — R040 Epistaxis: Secondary | ICD-10-CM

## 2021-06-22 DIAGNOSIS — I4891 Unspecified atrial fibrillation: Secondary | ICD-10-CM

## 2021-06-22 DIAGNOSIS — Z7901 Long term (current) use of anticoagulants: Secondary | ICD-10-CM

## 2021-06-24 NOTE — Telephone Encounter (Signed)
Left message to call back  

## 2021-06-24 NOTE — Telephone Encounter (Signed)
Spoke with patient for clarification since he said he could not previously do procedure on 5/25 (which is the soonest he could do it post ablation).  ?He states he still cannot do 5/25 and will keep [place] date of 09/03/2021.  ?He was grateful for call and agrees with plan.  ?

## 2021-07-17 ENCOUNTER — Ambulatory Visit (HOSPITAL_COMMUNITY)
Admission: RE | Admit: 2021-07-17 | Discharge: 2021-07-17 | Disposition: A | Discharge status: Home / Self Care | Payer: Medicare Other | Source: Ambulatory Visit | Attending: Physician Assistant | Admitting: Physician Assistant

## 2021-07-17 VITALS — BP 144/80 | HR 71 | Ht 70.0 in | Wt 197.8 lb

## 2021-07-17 DIAGNOSIS — J449 Chronic obstructive pulmonary disease, unspecified: Secondary | ICD-10-CM | POA: Diagnosis not present

## 2021-07-17 DIAGNOSIS — Z7901 Long term (current) use of anticoagulants: Secondary | ICD-10-CM | POA: Diagnosis not present

## 2021-07-17 DIAGNOSIS — Z8546 Personal history of malignant neoplasm of prostate: Secondary | ICD-10-CM | POA: Insufficient documentation

## 2021-07-17 DIAGNOSIS — I82409 Acute embolism and thrombosis of unspecified deep veins of unspecified lower extremity: Secondary | ICD-10-CM | POA: Diagnosis not present

## 2021-07-17 DIAGNOSIS — I10 Essential (primary) hypertension: Secondary | ICD-10-CM | POA: Diagnosis not present

## 2021-07-17 DIAGNOSIS — I480 Paroxysmal atrial fibrillation: Secondary | ICD-10-CM | POA: Diagnosis not present

## 2021-07-17 DIAGNOSIS — D6869 Other thrombophilia: Secondary | ICD-10-CM | POA: Insufficient documentation

## 2021-07-17 MED ORDER — METOPROLOL TARTRATE 25 MG PO TABS: 25.0000 mg | ORAL_TABLET | Freq: Two times a day (BID) | ORAL | 3 refills | Status: DC

## 2021-07-17 NOTE — Progress Notes (Addendum)
? ? ?Primary Care Physician: [name], MD ?Primary Cardiologist: Dr Marlou Porch ?Primary Electrophysiologist: Dr Quentin Ore ?Referring Physician: Dr Quentin Ore ? ? ?Wayne Roberson is a 76 y.o. male with a history of HTN, COPD, prostate cancer, remote DVT, atrial fibrillation who presents for follow up in the [facility]. Patient was evaluated by Dr Quentin Ore for afib ablation and Watchman implant. He has had issues with epistaxis on anticoagulation. Patient is on Eliquis for a CHADS2VASC score of 3. He underwent afib ablation on 06/19/21.  ? ?On follow up today, patient reports that he has continued to have fatigue and dyspnea with exertion. He is in SR today. No CP, swallowing pain, or groin issues.  ? ?Today, he denies symptoms of palpitations, chest pain, orthopnea, PND, lower extremity edema, dizziness, presyncope, syncope, snoring, daytime somnolence, bleeding, or neurologic sequela. The patient is tolerating medications without difficulties and is otherwise without complaint today.  ? ? ?Atrial Fibrillation Risk Factors: ? ?he does not have symptoms or diagnosis of sleep apnea. ?he does not have a history of rheumatic fever. ? ? ?he has a BMI of Body mass index is 28.38 kg/m?Marland KitchenMarland Kitchen ?Filed Weights  ? 07/17/21 1054  ?Weight: 89.7 kg  ? ? ?Family History  ?Problem Relation Age of Onset  ? Heart failure Mother   ? Heart failure Father   ? Cancer Neg Hx   ? ? ? ?Atrial Fibrillation Management history: ? ?Previous antiarrhythmic drugs: none ?Previous cardioversions: none ?Previous ablations: 06/19/21 ?CHADS2VASC score: 3 ?Anticoagulation history: Eliquis ? ? ?Past Medical History:  ?Diagnosis Date  ? Arthritis   ? knees  ? COPD (chronic obstructive pulmonary disease) ([place])   ? NEWLY DX , NOW RX'D INHALER PRN , NOT HAD TO USE YET   ? Deep vein thrombosis (DVT) of calf muscle vein (HCC) 3 YEARS AGO  ? LEFT CALF , UNSURE HOW IT WAS TREATED   ? Dyspnea   ? exercise  ? Hypertension   ? Prostate cancer  ([place])   ? Tinnitus   ? Trace cataracts   ? ?Past Surgical History:  ?Procedure Laterality Date  ? ATRIAL FIBRILLATION ABLATION N/A 06/19/2021  ? Procedure: ATRIAL FIBRILLATION ABLATION;  Surgeon: Vickie Epley, MD;  Location: [facility];  Service: Cardiovascular;  Laterality: N/A;  ? CATARACT EXTRACTION, BILATERAL  2018  ? PROSTATE BIOPSY    ? RADIOACTIVE SEED IMPLANT N/A 03/01/2014  ? Procedure: RADIOACTIVE SEED IMPLANT;  Surgeon: [name], MD;  Location: [facility];  Service: Urology;  Laterality: N/A;  ? TOTAL KNEE ARTHROPLASTY Right 05/15/2018  ? Procedure: TOTAL KNEE ARTHROPLASTY;  Surgeon: [name], MD;  Location: [facility];  Service: Orthopedics;  Laterality: Right;  79mn  ? VENTRAL HERNIA REPAIR    ? ? ?Current Outpatient Medications  ?Medication Sig Dispense Refill  ? albuterol (PROVENTIL HFA;VENTOLIN HFA) 108 (90 Base) MCG/ACT inhaler Inhale 2 puffs into the lungs every 6 (six) hours as needed for wheezing or shortness of breath. 1 Inhaler 6  ? ELIQUIS 5 MG TABS tablet TAKE 1 TABLET TWICE A DAY 180 tablet 1  ? Glucosamine-Chondroitin (OSTEO BI-FLEX REGULAR STRENGTH PO) Take 1 tablet by mouth in the morning and at bedtime.    ? ketorolac (ACULAR) 0.5 % ophthalmic solution Place 1 drop into the left eye in the morning, at noon, and at bedtime.    ? LORazepam (ATIVAN) 0.5 MG tablet Take 0.5 mg by mouth daily as needed for sleep.     ?  Multiple Vitamin (MULTIVITAMIN WITH MINERALS) TABS tablet Take 1 tablet by mouth daily.    ? Multiple Vitamins-Minerals (PRESERVISION AREDS PO) Take 1 capsule by mouth in the morning and at bedtime.    ? pantoprazole (PROTONIX) 40 MG tablet Take 1 tablet (40 mg total) by mouth daily. 45 tablet 0  ? prednisoLONE acetate (PRED FORTE) 1 % ophthalmic suspension Place 1 drop into the left eye in the morning, at noon, and at bedtime.    ? telmisartan (MICARDIS) 80 MG tablet TAKE 1 TABLET DAILY 90 tablet 3  ? metoprolol tartrate (LOPRESSOR) 25 MG tablet  Take 1 tablet (25 mg total) by mouth 2 (two) times daily. 60 tablet 3  ? ?No current facility-administered medications for this encounter.  ? ?Facility-Administered Medications Ordered in Other Encounters  ?Medication Dose Route Frequency Provider Last Rate Last Admin  ? regadenoson (LEXISCAN) injection SOLN 0.4 mg  0.4 mg Intravenous Once Turner, Traci R, MD      ? technetium tetrofosmin (TC-MYOVIEW) injection 37.8 millicurie  58.8 millicurie Intravenous Once PRN Sueanne Margarita, MD      ? ? ?Allergies  ?Allergen Reactions  ? Penicillins   ?  Did it involve swelling of the face/tongue/throat, SOB, or low BP? Unknown ?Did it involve sudden or severe rash/hives, skin peeling, or any reaction on the inside of your mouth or nose? Unknown ?Did you need to seek medical attention at a hospital or doctor's office? No ?When did it last happen?      Childhood allergy ?If all above answers are ?NO?, may proceed with cephalosporin use. ?  ? ? ?Social History  ? ?Socioeconomic History  ? Marital status: Married  ?  Spouse name: Not on file  ? Number of children: Not on file  ? Years of education: Not on file  ? Highest education level: Not on file  ?Occupational History  ? Not on file  ?Tobacco Use  ? Smoking status: Former  ?  Packs/day: 2.00  ?  Years: 42.00  ?  Pack years: 84.00  ?  Types: Cigarettes  ?  Quit date: 03/29/2004  ?  Years since quitting: 17.3  ? Smokeless tobacco: Never  ?Vaping Use  ? Vaping Use: Never used  ?Substance and Sexual Activity  ? Alcohol use: Yes  ?  Alcohol/week: 12.0 standard drinks  ?  Types: 10 Glasses of wine, 2 Standard drinks or equivalent per week  ?  Comment: 2 glasses of wine evening of 05/14/2018  ? Drug use: No  ? Sexual activity: Never  ?Other Topics Concern  ? Not on file  ?Social History Narrative  ? Not on file  ? ?Social Determinants of Health  ? ?Financial Resource Strain: Not on file  ?Food Insecurity: Not on file  ?Transportation Needs: Not on file  ?Physical Activity: Not on file   ?Stress: Not on file  ?Social Connections: Not on file  ?Intimate Partner Violence: Not on file  ? ? ? ?ROS- All systems are reviewed and negative except as per the HPI above. ? ?Physical Exam: ?Vitals:  ? 07/17/21 1054  ?BP: (!) 144/80  ?Pulse: 71  ?Weight: 89.7 kg  ?Height: '5\' 10"'$  (1.778 m)  ? ? ?GEN- The patient is a well appearing elderly male, alert and oriented x 3 today.   ?Head- normocephalic, atraumatic ?Eyes-  Sclera clear, conjunctiva pink ?Ears- hearing intact ?Oropharynx- clear ?Neck- supple  ?Lungs- Clear to ausculation bilaterally, normal work of breathing ?Heart- Regular rate and rhythm, no  murmurs, rubs or gallops  ?GI- soft, NT, ND, + BS ?Extremities- no clubbing, cyanosis, or edema ?MS- no significant deformity or atrophy ?Skin- no rash or lesion ?Psych- euthymic mood, full affect ?Neuro- strength and sensation are intact ? ?Wt Readings from Last 3 Encounters:  ?07/17/21 89.7 kg  ?06/19/21 90.3 kg  ?03/26/21 88.4 kg  ? ? ?EKG today demonstrates  ?SR, PVC ?Vent. rate 71 BPM ?PR interval 180 ms ?QRS duration 92 ms ?QT/QTcB 382/415 ms ? ?Echo 05/02/20 demonstrated  ? 1. Left ventricular ejection fraction, by estimation, is 60 to 65%. The  ?left ventricle has normal function. The left ventricle has no regional  ?wall motion abnormalities. Left ventricular diastolic parameters are  ?consistent with Grade I diastolic dysfunction (impaired relaxation). The average left ventricular global longitudinal strain is -20.1 %. The global longitudinal strain is normal.  ? 2. Right ventricular systolic function is normal. The right ventricular  ?size is normal.  ? 3. The mitral valve is normal in structure. Trivial mitral valve  ?regurgitation.  ? 4. The aortic valve is tricuspid. There is mild thickening of the aortic  ?valve. Aortic valve regurgitation is not visualized. No aortic stenosis is  ?present.  ? 5. The inferior vena cava is normal in size with greater than 50%  ?respiratory variability, suggesting  right atrial pressure of 3 mmHg.  ? ?Comparison(s): A prior study was performed on 08/10/2016. No significant change from prior study. Prior images reviewed side by side.  ? ?Epic records are reviewed at [facility]

## 2021-07-17 NOTE — Patient Instructions (Signed)
Decrease metoprolol to 25mg twice a day 

## 2021-07-30 DIAGNOSIS — H35352 Cystoid macular degeneration, left eye: Secondary | ICD-10-CM | POA: Diagnosis not present

## 2021-07-30 DIAGNOSIS — H353221 Exudative age-related macular degeneration, left eye, with active choroidal neovascularization: Secondary | ICD-10-CM | POA: Diagnosis not present

## 2021-07-30 DIAGNOSIS — H353111 Nonexudative age-related macular degeneration, right eye, early dry stage: Secondary | ICD-10-CM | POA: Diagnosis not present

## 2021-07-30 DIAGNOSIS — D3132 Benign neoplasm of left choroid: Secondary | ICD-10-CM | POA: Diagnosis not present

## 2021-07-30 DIAGNOSIS — Z961 Presence of intraocular lens: Secondary | ICD-10-CM | POA: Diagnosis not present

## 2021-08-13 ENCOUNTER — Encounter: Payer: Self-pay | Admitting: Cardiology

## 2021-08-21 NOTE — Progress Notes (Unsigned)
[facility]                                     Cardiology Office Note:    Date:  08/26/2021   ID:  Wayne Roberson, DOB 01-13-1946, MRN [id number]  PCP:  [name], MD  [facility] Cardiologist:  [name], MD  [facility] Electrophysiologist:  Vickie Epley, MD   Referring MD: [name], MD   Chief Complaint  Patient presents with   Follow-up    Pre Watchman    History of Present Illness:    Wayne Roberson is a 76 y.o. male with a hx of HTN, COPD, prostate cancer, remote DVT, atrial fibrillation who presents for follow up in the [facility]. Patient was evaluated by Dr Quentin Ore for afib ablation and Watchman implant. He has had issues with epistaxis on anticoagulation. Patient is on Eliquis for a CHADS2VASC score of 3. He underwent afib ablation on 06/19/21 and was seen in follow up 07/17/21 and was doing well from a CV standpoint. He continued to have some fatigue felt to be related to his beta blocker but otherwise was stable and maintaining NSR.   He is scheduled for Watchman implant 09/03/21 with Dr. Quentin Ore. He is here today for pre implant evaluation and states that he has been doing well since his ablation without chest pain, palpitations, LE edema, SOB, dizziness, or syncope.   Past Medical History:  Diagnosis Date   Arthritis    knees   COPD (chronic obstructive pulmonary disease) ([place])    NEWLY DX , NOW RX'D INHALER PRN , NOT HAD TO USE YET    Deep vein thrombosis (DVT) of calf muscle vein (HCC) 3 YEARS AGO   LEFT CALF , UNSURE HOW IT WAS TREATED    Dyspnea    exercise   Hypertension    Prostate cancer ([place])    Tinnitus    Trace cataracts     Past Surgical History:  Procedure Laterality Date   ATRIAL FIBRILLATION ABLATION N/A 06/19/2021   Procedure: ATRIAL FIBRILLATION ABLATION;  Surgeon: Vickie Epley, MD;  Location: [facility];  Service: Cardiovascular;  Laterality: N/A;   CATARACT  EXTRACTION, BILATERAL  2018   PROSTATE BIOPSY     RADIOACTIVE SEED IMPLANT N/A 03/01/2014   Procedure: RADIOACTIVE SEED IMPLANT;  Surgeon: [name], MD;  Location: [facility];  Service: Urology;  Laterality: N/A;   TOTAL KNEE ARTHROPLASTY Right 05/15/2018   Procedure: TOTAL KNEE ARTHROPLASTY;  Surgeon: [name], MD;  Location: [facility];  Service: Orthopedics;  Laterality: Right;  46mn   VENTRAL HERNIA REPAIR      Current Medications: Current Meds  Medication Sig   albuterol (PROVENTIL HFA;VENTOLIN HFA) 108 (90 Base) MCG/ACT inhaler Inhale 2 puffs into the lungs every 6 (six) hours as needed for wheezing or shortness of breath.   ELIQUIS 5 MG TABS tablet TAKE 1 TABLET TWICE A DAY   fluocinonide (LIDEX) 0.05 % external solution 2 (two) times daily.   Glucosamine-Chondroitin (OSTEO BI-FLEX REGULAR STRENGTH PO) Take 1 tablet by mouth in the morning and at bedtime.   LORazepam (ATIVAN) 0.5 MG tablet Take 0.5 mg by mouth daily as needed for sleep.    metoprolol tartrate (LOPRESSOR) 25 MG tablet Take 1 tablet (25 mg total) by mouth 2 (two) times daily.   Multiple Vitamin (  MULTIVITAMIN WITH MINERALS) TABS tablet Take 1 tablet by mouth daily.   Multiple Vitamins-Minerals (PRESERVISION AREDS PO) Take 1 capsule by mouth in the morning and at bedtime.   prednisoLONE acetate (PRED FORTE) 1 % ophthalmic suspension Place 1 drop into the left eye in the morning, at noon, and at bedtime.   telmisartan (MICARDIS) 80 MG tablet TAKE 1 TABLET DAILY     Allergies:   Penicillins   Social History   Socioeconomic History   Marital status: Married    Spouse name: Not on file   Number of children: Not on file   Years of education: Not on file   Highest education level: Not on file  Occupational History   Not on file  Tobacco Use   Smoking status: Former    Packs/day: 2.00    Years: 42.00    Pack years: 84.00    Types: Cigarettes    Quit date: 03/29/2004    Years since quitting:  17.4   Smokeless tobacco: Never  Vaping Use   Vaping Use: Never used  Substance and Sexual Activity   Alcohol use: Yes    Alcohol/week: 12.0 standard drinks    Types: 10 Glasses of wine, 2 Standard drinks or equivalent per week    Comment: 2 glasses of wine evening of 05/14/2018   Drug use: No   Sexual activity: Never  Other Topics Concern   Not on file  Social History Narrative   Not on file   Social Determinants of Health   Financial Resource Strain: Not on file  Food Insecurity: Not on file  Transportation Needs: Not on file  Physical Activity: Not on file  Stress: Not on file  Social Connections: Not on file    Family History: The patient's family history includes Heart failure in his father and mother. There is no history of Cancer.  ROS:   Please see the history of present illness.    All other systems reviewed and are negative.  EKGs/Labs/Other Studies Reviewed:    The following studies were reviewed today:  CT 2021-04-13:  IMPRESSION: 1. The left atrial appendage is a large chicken wing morphology without thrombus.   2. Normal pulmonary vein anatomy.   3. A 24 mm Watchman FLX device is recommended based on the above landing zone measurements (oval shaped, 20.6 mm average diameter; 14% compression).   4. A mid posterior IAS puncture site is recommended.   5. Optimal deployment angle: RAO 32 CRA 14   6. Normal coronary origin. Right dominance. CAC score of 0.6, which is 7th percentile for age-, sex-, and race-matched controls.   AF ablation 06/19/21: CONCLUSIONS: 1. Successful PVI 2. Successful ablation of the cavotricuspid isthmus for typical atrial flutter 3. Intracardiac echo reveals trivial pericardial effusion, normal LV function, normal  LA architecture 4. No early apparent complications. 5. Colchicine 0.6 mg PO BID x 5 days 6. Protonix '40mg'$  PO daily x 45 days   EKG:  EKG is ordered today.  The ekg ordered today demonstrates NSR with HR  64bpm  Recent Labs: 06/19/2021: BUN 15; Creatinine, Ser 0.79; Hemoglobin 14.5; Platelets 190; Potassium 4.2; Sodium 142  Recent Lipid Panel No results found for: CHOL, TRIG, HDL, CHOLHDL, VLDL, LDLCALC, LDLDIRECT  Risk Assessment/Calculations:    CHA2DS2-VASc Score = 3  The patient's score is based upon: CHF History: 0 HTN History: 1 Diabetes History: 0 Stroke History: 0 Vascular Disease History: 0 Age Score: 2 Gender Score: 0  HAS-BLED score 2 Hypertension (Uncontrolled  in 30 days)  No  Abnormal renal and liver function (Dialysis, transplant, Cr >2.26 mg/dL /Cirrhosis or Bilirubin >2x Normal or AST/ALT/AP >3x Normal) No  Stroke No  Bleeding Yes Labile INR (Unstable/high INR) No  Elderly (>65) Yes  Drugs or alcohol (? 8 drinks/week, anti-plt or NSAID) No   Physical Exam:    VS:  BP 136/72   Roberson 64   Ht '5\' 10"'$  (1.778 m)   Wt 198 lb (89.8 kg)   SpO2 96%   BMI 28.41 kg/m     Wt Readings from Last 3 Encounters:  08/26/21 198 lb (89.8 kg)  07/17/21 197 lb 12.8 oz (89.7 kg)  06/19/21 199 lb (90.3 kg)    General: Well developed, well nourished, NAD Lungs:Clear to ausculation bilaterally. Breathing is unlabored. Cardiovascular: RRR with S1 S2. No murmurs Extremities: No edema.  Neuro: Alert and oriented. No focal deficits. No facial asymmetry. MAE spontaneously. Psych: Responds to questions appropriately with normal affect.    ASSESSMENT/PLAN:   Paroxysmal Atrial Fibrillation: He is s/p AF ablation with Dr. Quentin Ore 06/19/21 and wishes to proceed with Watchman implant for long term stroke prevention scheduled for 09/03/21. Tolerating Eliquis without issues. Reviewed pre Watchman instruction letter with all questions answered. Plan for post implant follow up with myself in 3-4 weeks post procedure. Will need SBE prophylaxis for 6 months after implant. Obtain BMET, CBC    HTN: Stable, 136/72. No changes needed today   Medication Adjustments/Labs and Tests Ordered: Current  medicines are reviewed at length with the patient today.  Concerns regarding medicines are outlined above.  Orders Placed This Encounter  Procedures   Basic metabolic panel   CBC   EKG 12-Lead   No orders of the defined types were placed in this encounter.   Patient Instructions  Medication Instructions:  Your physician recommends that you continue on your current medications as directed. Please refer to the Current Medication list given to you today.  *If you need a refill on your cardiac medications before your next appointment, please call your pharmacy*   Lab Work: TODAY: BMET, CBC  If you have labs (blood work) drawn today and your tests are completely normal, you will receive your results only by: [place] (if you have MyChart) OR A paper copy in the mail If you have any lab test that is abnormal or we need to change your treatment, we will call you to review the results.   Testing/Procedures: NONE   Follow-Up:To be determined At [facility], you and your health needs are our priority.  As part of our continuing mission to provide you with exceptional heart care, we have created designated Provider Care Teams.  These Care Teams include your primary Cardiologist (physician) and Advanced Practice Providers (APPs -  Physician Assistants and Nurse Practitioners) who all work together to provide you with the care you need, when you need it.   Important Information About Sugar         Signed, Kathyrn Drown, NP  08/26/2021 10:48 AM    [facility]

## 2021-08-26 ENCOUNTER — Ambulatory Visit (INDEPENDENT_AMBULATORY_CARE_PROVIDER_SITE_OTHER): Payer: Medicare Other | Admitting: Cardiology

## 2021-08-26 VITALS — BP 136/72 | HR 64 | Ht 70.0 in | Wt 198.0 lb

## 2021-08-26 DIAGNOSIS — R040 Epistaxis: Secondary | ICD-10-CM | POA: Diagnosis not present

## 2021-08-26 DIAGNOSIS — I480 Paroxysmal atrial fibrillation: Secondary | ICD-10-CM | POA: Diagnosis not present

## 2021-08-26 DIAGNOSIS — I10 Essential (primary) hypertension: Secondary | ICD-10-CM | POA: Diagnosis not present

## 2021-08-26 LAB — CBC
Hematocrit: 44.6 % (ref 37.5–51.0)
Hemoglobin: 14.9 g/dL (ref 13.0–17.7)
MCH: 29.6 pg (ref 26.6–33.0)
MCHC: 33.4 g/dL (ref 31.5–35.7)
MCV: 89 fL (ref 79–97)
Platelets: 219 10*3/uL (ref 150–450)
RBC: 5.04 x10E6/uL (ref 4.14–5.80)
RDW: 13.1 % (ref 11.6–15.4)
WBC: 8.5 10*3/uL (ref 3.4–10.8)

## 2021-08-26 LAB — BASIC METABOLIC PANEL
BUN/Creatinine Ratio: 16 (ref 10–24)
BUN: 14 mg/dL (ref 8–27)
CO2: 25 mmol/L (ref 20–29)
Calcium: 9.2 mg/dL (ref 8.6–10.2)
Chloride: 103 mmol/L (ref 96–106)
Creatinine, Ser: 0.85 mg/dL (ref 0.76–1.27)
Glucose: 109 mg/dL — ABNORMAL HIGH (ref 70–99)
Potassium: 4.8 mmol/L (ref 3.5–5.2)
Sodium: 140 mmol/L (ref 134–144)
eGFR: 89 mL/min/{1.73_m2} (ref >59)

## 2021-08-26 NOTE — Patient Instructions (Signed)
Medication Instructions:  Your physician recommends that you continue on your current medications as directed. Please refer to the Current Medication list given to you today.  *If you need a refill on your cardiac medications before your next appointment, please call your pharmacy*   Lab Work: TODAY: BMET, CBC  If you have labs (blood work) drawn today and your tests are completely normal, you will receive your results only by: [place] (if you have MyChart) OR A paper copy in the mail If you have any lab test that is abnormal or we need to change your treatment, we will call you to review the results.   Testing/Procedures: NONE   Follow-Up:To be determined At [facility], you and your health needs are our priority.  As part of our continuing mission to provide you with exceptional heart care, we have created designated Provider Care Teams.  These Care Teams include your primary Cardiologist (physician) and Advanced Practice Providers (APPs -  Physician Assistants and Nurse Practitioners) who all work together to provide you with the care you need, when you need it.   Important Information About Sugar

## 2021-09-01 ENCOUNTER — Telehealth: Payer: Self-pay

## 2021-09-01 NOTE — Telephone Encounter (Signed)
Confirmed with patient Watchman implant on 09/03/2021. Confirmed arrival time of 0900 for 1130 procedure start. Medication instructions reviewed.  He will call if issues/questions arise prior to [place]. He was grateful for call and agrees with plan.

## 2021-09-01 NOTE — Telephone Encounter (Signed)
Called to review instructions for Watchman 09/03/2021.   Left message to call back.

## 2021-09-03 ENCOUNTER — Other Ambulatory Visit: Payer: Self-pay

## 2021-09-03 ENCOUNTER — Inpatient Hospital Stay (HOSPITAL_COMMUNITY): Payer: Medicare Other

## 2021-09-03 ENCOUNTER — Encounter (HOSPITAL_COMMUNITY)
Admission: RE | Disposition: A | Discharge status: Home / Self Care | Payer: Self-pay | Source: Home / Self Care | Attending: Cardiology

## 2021-09-03 ENCOUNTER — Encounter (HOSPITAL_COMMUNITY): Payer: Self-pay | Admitting: Cardiology

## 2021-09-03 ENCOUNTER — Inpatient Hospital Stay (HOSPITAL_COMMUNITY): Payer: Medicare Other | Admitting: Anesthesiology

## 2021-09-03 ENCOUNTER — Inpatient Hospital Stay (HOSPITAL_COMMUNITY)
Admission: RE | Admit: 2021-09-03 | Discharge: 2021-09-03 | Disposition: A | Discharge status: Home / Self Care | Payer: Medicare Other | Source: Ambulatory Visit | Attending: Cardiology | Admitting: Cardiology

## 2021-09-03 ENCOUNTER — Inpatient Hospital Stay (HOSPITAL_COMMUNITY)
Admission: RE | Admit: 2021-09-03 | Discharge: 2021-09-03 | DRG: 229 | Disposition: A | Discharge status: Home / Self Care | Payer: Medicare Other | Attending: Cardiology | Admitting: Cardiology

## 2021-09-03 DIAGNOSIS — M1990 Unspecified osteoarthritis, unspecified site: Secondary | ICD-10-CM

## 2021-09-03 DIAGNOSIS — Z880 Allergy status to penicillin: Secondary | ICD-10-CM

## 2021-09-03 DIAGNOSIS — Z96651 Presence of right artificial knee joint: Secondary | ICD-10-CM | POA: Diagnosis not present

## 2021-09-03 DIAGNOSIS — Z87891 Personal history of nicotine dependence: Secondary | ICD-10-CM | POA: Diagnosis not present

## 2021-09-03 DIAGNOSIS — R040 Epistaxis: Secondary | ICD-10-CM | POA: Diagnosis present

## 2021-09-03 DIAGNOSIS — Z9842 Cataract extraction status, left eye: Secondary | ICD-10-CM

## 2021-09-03 DIAGNOSIS — Z79899 Other long term (current) drug therapy: Secondary | ICD-10-CM | POA: Diagnosis not present

## 2021-09-03 DIAGNOSIS — Z006 Encounter for examination for normal comparison and control in clinical research program: Secondary | ICD-10-CM | POA: Diagnosis not present

## 2021-09-03 DIAGNOSIS — Z8249 Family history of ischemic heart disease and other diseases of the circulatory system: Secondary | ICD-10-CM | POA: Diagnosis not present

## 2021-09-03 DIAGNOSIS — I252 Old myocardial infarction: Secondary | ICD-10-CM

## 2021-09-03 DIAGNOSIS — J449 Chronic obstructive pulmonary disease, unspecified: Secondary | ICD-10-CM | POA: Diagnosis present

## 2021-09-03 DIAGNOSIS — Z01818 Encounter for other preprocedural examination: Secondary | ICD-10-CM | POA: Diagnosis not present

## 2021-09-03 DIAGNOSIS — I10 Essential (primary) hypertension: Secondary | ICD-10-CM | POA: Diagnosis present

## 2021-09-03 DIAGNOSIS — I480 Paroxysmal atrial fibrillation: Secondary | ICD-10-CM | POA: Diagnosis not present

## 2021-09-03 DIAGNOSIS — Z9841 Cataract extraction status, right eye: Secondary | ICD-10-CM | POA: Diagnosis not present

## 2021-09-03 DIAGNOSIS — Z538 Procedure and treatment not carried out for other reasons: Secondary | ICD-10-CM | POA: Diagnosis not present

## 2021-09-03 DIAGNOSIS — I4891 Unspecified atrial fibrillation: Secondary | ICD-10-CM

## 2021-09-03 DIAGNOSIS — Z7901 Long term (current) use of anticoagulants: Secondary | ICD-10-CM | POA: Diagnosis not present

## 2021-09-03 DIAGNOSIS — Z86718 Personal history of other venous thrombosis and embolism: Secondary | ICD-10-CM | POA: Diagnosis not present

## 2021-09-03 DIAGNOSIS — M179 Osteoarthritis of knee, unspecified: Secondary | ICD-10-CM | POA: Diagnosis present

## 2021-09-03 DIAGNOSIS — Z8546 Personal history of malignant neoplasm of prostate: Secondary | ICD-10-CM

## 2021-09-03 DIAGNOSIS — C61 Malignant neoplasm of prostate: Secondary | ICD-10-CM | POA: Diagnosis present

## 2021-09-03 DIAGNOSIS — D6869 Other thrombophilia: Secondary | ICD-10-CM | POA: Diagnosis present

## 2021-09-03 HISTORY — PX: TEE WITHOUT CARDIOVERSION: SHX5443

## 2021-09-03 HISTORY — PX: LEFT ATRIAL APPENDAGE OCCLUSION: EP1229

## 2021-09-03 LAB — TYPE AND SCREEN
ABO/RH(D): A POS
Antibody Screen: NEGATIVE

## 2021-09-03 LAB — POCT ACTIVATED CLOTTING TIME: Activated Clotting Time: 305 seconds

## 2021-09-03 LAB — ECHO TEE
AV Mean grad: 2 mmHg
AV Peak grad: 3 mmHg
Ao pk vel: 0.86 m/s

## 2021-09-03 LAB — SURGICAL PCR SCREEN
MRSA, PCR: NEGATIVE
Staphylococcus aureus: POSITIVE — AB

## 2021-09-03 SURGERY — LEFT ATRIAL APPENDAGE OCCLUSION
Anesthesia: General

## 2021-09-03 MED ORDER — PHENYLEPHRINE HCL-NACL 20-0.9 MG/250ML-% IV SOLN
INTRAVENOUS | Status: DC | PRN
  Administered 2021-09-03: 25 ug/min via INTRAVENOUS

## 2021-09-03 MED ORDER — HEPARIN (PORCINE) IN NACL 2000-0.9 UNIT/L-% IV SOLN
INTRAVENOUS | Status: AC
  Filled 2021-09-03: qty 1000

## 2021-09-03 MED ORDER — ONDANSETRON HCL 4 MG/2ML IJ SOLN
INTRAMUSCULAR | Status: DC | PRN
  Administered 2021-09-03: 4 mg via INTRAVENOUS

## 2021-09-03 MED ORDER — PROPOFOL 10 MG/ML IV BOLUS
INTRAVENOUS | Status: DC | PRN
  Administered 2021-09-03: 150 mg via INTRAVENOUS

## 2021-09-03 MED ORDER — APIXABAN 5 MG PO TABS
5.0000 mg | ORAL_TABLET | Freq: Two times a day (BID) | ORAL | Status: DC
  Administered 2021-09-03: 5 mg via ORAL
  Filled 2021-09-03: qty 1

## 2021-09-03 MED ORDER — ACETAMINOPHEN 325 MG PO TABS: 650.0000 mg | ORAL_TABLET | ORAL | Status: DC | PRN

## 2021-09-03 MED ORDER — VANCOMYCIN HCL IN DEXTROSE 1-5 GM/200ML-% IV SOLN
1000.0000 mg | INTRAVENOUS | Status: AC
  Administered 2021-09-03: 1000 mg via INTRAVENOUS
  Filled 2021-09-03: qty 200

## 2021-09-03 MED ORDER — SUGAMMADEX SODIUM 200 MG/2ML IV SOLN
INTRAVENOUS | Status: DC | PRN
  Administered 2021-09-03: 200 mg via INTRAVENOUS

## 2021-09-03 MED ORDER — ONDANSETRON HCL 4 MG/2ML IJ SOLN: 4.0000 mg | Freq: Four times a day (QID) | INTRAMUSCULAR | Status: DC | PRN

## 2021-09-03 MED ORDER — IOHEXOL 350 MG/ML SOLN
INTRAVENOUS | Status: DC | PRN
  Administered 2021-09-03: 20 mL

## 2021-09-03 MED ORDER — ROCURONIUM BROMIDE 10 MG/ML (PF) SYRINGE
PREFILLED_SYRINGE | INTRAVENOUS | Status: DC | PRN
  Administered 2021-09-03: 50 mg via INTRAVENOUS
  Administered 2021-09-03: 20 mg via INTRAVENOUS

## 2021-09-03 MED ORDER — HEPARIN (PORCINE) IN NACL 1000-0.9 UT/500ML-% IV SOLN
INTRAVENOUS | Status: DC | PRN
  Administered 2021-09-03: 500 mL

## 2021-09-03 MED ORDER — HEPARIN (PORCINE) IN NACL 1000-0.9 UT/500ML-% IV SOLN
INTRAVENOUS | Status: AC
  Filled 2021-09-03: qty 500

## 2021-09-03 MED ORDER — PHENYLEPHRINE 80 MCG/ML (10ML) SYRINGE FOR IV PUSH (FOR BLOOD PRESSURE SUPPORT)
PREFILLED_SYRINGE | INTRAVENOUS | Status: DC | PRN
  Administered 2021-09-03: 80 ug via INTRAVENOUS
  Administered 2021-09-03: 160 ug via INTRAVENOUS
  Administered 2021-09-03: 80 ug via INTRAVENOUS
  Administered 2021-09-03: 160 ug via INTRAVENOUS

## 2021-09-03 MED ORDER — SODIUM CHLORIDE 0.9% FLUSH: 3.0000 mL | INTRAVENOUS | Status: DC | PRN

## 2021-09-03 MED ORDER — FENTANYL CITRATE (PF) 100 MCG/2ML IJ SOLN
INTRAMUSCULAR | Status: DC | PRN
  Administered 2021-09-03 (×2): 50 ug via INTRAVENOUS

## 2021-09-03 MED ORDER — HEPARIN (PORCINE) IN NACL 2000-0.9 UNIT/L-% IV SOLN
INTRAVENOUS | Status: DC | PRN
  Administered 2021-09-03: 1000 mL

## 2021-09-03 MED ORDER — HEPARIN SODIUM (PORCINE) 1000 UNIT/ML IJ SOLN
INTRAMUSCULAR | Status: DC | PRN
  Administered 2021-09-03: 13000 [IU] via INTRAVENOUS
  Administered 2021-09-03: 2000 [IU] via INTRAVENOUS

## 2021-09-03 MED ORDER — SODIUM CHLORIDE 0.9 % IV SOLN: 250.0000 mL | INTRAVENOUS | Status: DC | PRN

## 2021-09-03 MED ORDER — SODIUM CHLORIDE 0.9 % IV SOLN: INTRAVENOUS | Status: DC

## 2021-09-03 MED ORDER — SODIUM CHLORIDE 0.9% FLUSH
3.0000 mL | Freq: Two times a day (BID) | INTRAVENOUS | Status: DC
Start: 2021-09-03 — End: 2021-09-04

## 2021-09-03 MED ORDER — CHLORHEXIDINE GLUCONATE 0.12 % MT SOLN
OROMUCOSAL | Status: AC
  Administered 2021-09-03: 15 mL
  Filled 2021-09-03: qty 15

## 2021-09-03 MED ORDER — PROTAMINE SULFATE 10 MG/ML IV SOLN
INTRAVENOUS | Status: DC | PRN
  Administered 2021-09-03: 35 mg via INTRAVENOUS

## 2021-09-03 MED ORDER — DEXAMETHASONE SODIUM PHOSPHATE 10 MG/ML IJ SOLN
INTRAMUSCULAR | Status: DC | PRN
  Administered 2021-09-03: 5 mg via INTRAVENOUS

## 2021-09-03 MED ORDER — LIDOCAINE 2% (20 MG/ML) 5 ML SYRINGE
INTRAMUSCULAR | Status: DC | PRN
  Administered 2021-09-03: 60 mg via INTRAVENOUS

## 2021-09-03 SURGICAL SUPPLY — 17 items
CATH DIAG 6FR PIGTAIL ANGLED (CATHETERS) ×1 IMPLANT
CLOSURE PERCLOSE PROSTYLE (VASCULAR PRODUCTS) ×2 IMPLANT
DEVICE WATCHMAN FLX PROC (KITS) IMPLANT
DILATOR VESSEL 38 20CM 11FR (INTRODUCER) ×1 IMPLANT
KIT HEART LEFT (KITS) ×2 IMPLANT
KIT SHEA VERSACROSS LAAC CONNE (KITS) ×1 IMPLANT
PACK CARDIAC CATHETERIZATION (CUSTOM PROCEDURE TRAY) ×2 IMPLANT
PAD DEFIB RADIO PHYSIO CONN (PAD) ×2 IMPLANT
SHEATH PERFORMER 16FR 30 (SHEATH) ×1 IMPLANT
SHEATH PINNACLE 8F 10CM (SHEATH) ×1 IMPLANT
SHEATH PROBE COVER 6X72 (BAG) ×2 IMPLANT
SYS WATCHMAN FXD DBL (SHEATH) ×2
SYSTEM WATCHMAN FXD DBL (SHEATH) IMPLANT
TRANSDUCER W/STOPCOCK (MISCELLANEOUS) ×2 IMPLANT
TUBING CIL FLEX 10 FLL-RA (TUBING) ×2 IMPLANT
WATCHMAN FLX PROCEDURE DEVICE (KITS) ×2 IMPLANT
WATCHMAN PROCED TRUSEAL ACCESS (SHEATH) ×1 IMPLANT

## 2021-09-03 NOTE — Anesthesia Preprocedure Evaluation (Signed)
Anesthesia Evaluation  Patient identified by MRN, date of birth, ID band Patient awake    Reviewed: Allergy & Precautions, NPO status , Patient's Chart, lab work & pertinent test results  History of Anesthesia Complications Negative for: history of anesthetic complications  Airway Mallampati: II  TM Distance: >3 FB Neck ROM: Full    Dental  (+) Dental Advisory Given   Pulmonary COPD, former smoker,    breath sounds clear to auscultation       Cardiovascular hypertension, Pt. on medications and Pt. on home beta blockers (-) angina+ dysrhythmias Atrial Fibrillation  Rhythm:Irregular Rate:Normal  07/2020 Stress: EF 58%, no ST deviation 04/2020 ECHO: EF 60-65%, normal LVF, Grade 1 DD, trivial MR   Neuro/Psych negative neurological ROS     GI/Hepatic negative GI ROS, Neg liver ROS,   Endo/Other  negative endocrine ROS  Renal/GU negative Renal ROS   Prostate cancer    Musculoskeletal  (+) Arthritis ,   Abdominal   Peds  Hematology eliquis   Anesthesia Other Findings   Reproductive/Obstetrics                             Anesthesia Physical  Anesthesia Plan  ASA: 3  Anesthesia Plan: General   Post-op Pain Management: Tylenol PO (pre-op)* and Minimal or no pain anticipated   Induction: Intravenous  PONV Risk Score and Plan: 2 and Ondansetron and Dexamethasone  Airway Management Planned: Oral ETT  Additional Equipment: ClearSight  Intra-op Plan:   Post-operative Plan: Extubation in OR  Informed Consent: I have reviewed the patients History and Physical, chart, labs and discussed the procedure including the risks, benefits and alternatives for the proposed anesthesia with the patient or authorized representative who has indicated his/her understanding and acceptance.     Dental advisory given  Plan Discussed with: CRNA and Surgeon  Anesthesia Plan Comments:          Anesthesia Quick Evaluation

## 2021-09-03 NOTE — Anesthesia Postprocedure Evaluation (Signed)
Anesthesia Post Note  Patient: Wayne Roberson  Procedure(s) Performed: LEFT ATRIAL APPENDAGE OCCLUSION TRANSESOPHAGEAL ECHOCARDIOGRAM (TEE)     Patient location during evaluation: PACU Anesthesia Type: General Level of consciousness: awake and alert Pain management: pain level controlled Vital Signs Assessment: post-procedure vital signs reviewed and stable Respiratory status: spontaneous breathing, nonlabored ventilation, respiratory function stable and patient connected to nasal cannula oxygen Cardiovascular status: blood pressure returned to baseline and stable Postop Assessment: no apparent nausea or vomiting Anesthetic complications: no   There were no known notable events for this encounter.  Last Vitals:  Vitals:   09/03/21 1234 09/03/21 1235  BP: 139/73 139/73  Pulse: (!) 54 (!) 52  Resp: 14 (!) 8  Temp: (!) 36.2 C   SpO2: 99% 100%    Last Pain:  Vitals:   09/03/21 1234  TempSrc: Temporal  PainSc: 0-No pain                 Tiajuana Amass

## 2021-09-03 NOTE — Discharge Summary (Signed)
[facility]    Patient ID: Wayne Roberson,  MRN: [id number], DOB/AGE: 08-15-1945 76 y.o.  Admit date: 09/03/2021 Discharge date: 09/03/2021  Primary Care Physician: [name], MD  Primary Cardiologist: [name], MD  Electrophysiologist: Vickie Epley, MD  Procedures This Admission:   Unsuccessful attempt at [facility] closure with Watchman.   Brief HPI: Wayne Roberson is a 76 y.o. male with a history of HTN, COPD, prostate cancer, remote DVT, atrial fibrillation who presents for follow up in the [facility]. Patient was evaluated by Dr Quentin Ore for afib ablation and Watchman implant. He has had issues with epistaxis on anticoagulation. Patient is on Eliquis for a CHADS2VASC score of 3. He underwent afib ablation on 06/19/21 and was seen in follow up 07/17/21 and was doing well from a CV standpoint. He continued to have some fatigue felt to be related to his beta blocker but otherwise was stable and maintaining NSR.    In follow up with myself 08/26/21, he continued to do well and was scheduled for Watchman implant 09/03/21 with Dr. Quentin Ore.  The risks, benefits, and alternatives to left atrial appendage occlusive device placement device were reviewed with the patient and the patient wished to proceed.   Hospital Course:  The patient was admitted and underwent attempted eft atrial appendage occlusive device placement however this was unsuccessful. Groin site has remained stable with no evidence of hematoma or bleeding. The patient was examined and considered to be stable for discharge today given no device implant. Wound care and restrictions were reviewed with the patient. Medication plan will be to continue current regiment with Eliquis '5mg'$  BID. We will contact the patient for follow up with Dr. Quentin Ore for further discussion.   Paroxysmal Atrial Fibrillation: s/p AF ablation with Dr. Quentin Ore 06/19/21 and wished to proceed with Watchman implant for  long term stroke prevention scheduled for 09/03/21. Unfortunately as above, this was unsuccessful. He will continue Eliquis '5mg'$  BID for now and will plan for follow up with Dr. Quentin Ore after discharge.    HTN: Stable, 136/72. No changes needed today    Physical Exam: Vitals:   09/03/21 1225 09/03/21 1230 09/03/21 1234 09/03/21 1235  BP: 134/70 132/82 139/73 139/73  Pulse: (!) 56 (!) 56 (!) 54 (!) 52  Resp: '10 13 14 '$ (!) 8  Temp:   (!) 97.1 F (36.2 C)   TempSrc:   Temporal   SpO2: 98% 97% 99% 100%  Weight:      Height:       Labs:   Lab Results  Component Value Date   WBC 8.5 08/26/2021   HGB 14.9 08/26/2021   HCT 44.6 08/26/2021   MCV 89 08/26/2021   PLT 219 08/26/2021   No results for input(s): "NA", "K", "CL", "CO2", "BUN", "CREATININE", "CALCIUM", "PROT", "BILITOT", "ALKPHOS", "ALT", "AST", "GLUCOSE" in the last 168 hours.  Invalid input(s): "LABALBU"   Discharge Medications:  Allergies as of 09/03/2021       Reactions   Penicillins    Did it involve swelling of the face/tongue/throat, SOB, or low BP? Unknown Did it involve sudden or severe rash/hives, skin peeling, or any reaction on the inside of your mouth or nose? Unknown Did you need to seek medical attention at a hospital or doctor's office? No When did it last happen?      Childhood allergy If all above answers are "NO", may proceed with cephalosporin use.  Medication List     TAKE these medications    albuterol 108 (90 Base) MCG/ACT inhaler Commonly known as: VENTOLIN HFA Inhale 2 puffs into the lungs every 6 (six) hours as needed for wheezing or shortness of breath.   carboxymethylcellulose 0.5 % Soln Commonly known as: REFRESH PLUS 1 drop 3 (three) times daily as needed (dry eyes).   Eliquis 5 MG Tabs tablet Generic drug: apixaban TAKE 1 TABLET TWICE A DAY   fluocinonide 0.05 % external solution Commonly known as: LIDEX Apply 1 application. topically daily. After shower   LORazepam 0.5  MG tablet Commonly known as: ATIVAN Take 0.5 mg by mouth at bedtime as needed for sleep.   metoprolol tartrate 25 MG tablet Commonly known as: LOPRESSOR Take 1 tablet (25 mg total) by mouth 2 (two) times daily.   multivitamin with minerals Tabs tablet Take 1 tablet by mouth daily.   OSTEO BI-FLEX REGULAR STRENGTH PO Take 1 tablet by mouth in the morning and at bedtime.   prednisoLONE acetate 1 % ophthalmic suspension Commonly known as: PRED FORTE Place 1 drop into the left eye 2 (two) times daily. Noon and Bedtime   PRESERVISION AREDS PO Take 1 capsule by mouth in the morning and at bedtime.   telmisartan 80 MG tablet Commonly known as: MICARDIS TAKE 1 TABLET DAILY   vitamin C 500 MG tablet Commonly known as: ASCORBIC ACID Take 500 mg by mouth daily.        Disposition:  Home  Discharge Instructions     Call MD for:  difficulty breathing, headache or visual disturbances   Complete by: As directed    Call MD for:  extreme fatigue   Complete by: As directed    Call MD for:  hives   Complete by: As directed    Call MD for:  persistant dizziness or light-headedness   Complete by: As directed    Call MD for:  persistant nausea and vomiting   Complete by: As directed    Call MD for:  redness, tenderness, or signs of infection (pain, swelling, redness, odor or green/yellow discharge around incision site)   Complete by: As directed    Call MD for:  severe uncontrolled pain   Complete by: As directed    Call MD for:  temperature >100.4   Complete by: As directed    Diet - low sodium heart healthy   Complete by: As directed    Discharge instructions   Complete by: As directed    No driving for 2-3 days. No lifting over 5 lbs for 1 week. No sexual activity for 1 week. Keep procedure site clean & dry. If you notice increased pain, swelling, bleeding or pus, call/return!  You may shower, but no soaking baths/hot tubs/pools for 1 week.   Increase activity slowly   Complete  by: As directed         Duration of Discharge Encounter: Greater than 30 minutes including physician time.  Signed, Kathyrn Drown, NP  09/03/2021 1:30 PM

## 2021-09-03 NOTE — Transfer of Care (Signed)
Immediate Anesthesia Transfer of Care Note  Patient: Wayne Roberson  Procedure(s) Performed: LEFT ATRIAL APPENDAGE OCCLUSION TRANSESOPHAGEAL ECHOCARDIOGRAM (TEE)  Patient Location: PACU and Cath Lab  Anesthesia Type:General  Level of Consciousness: awake, alert  and oriented  Airway & Oxygen Therapy: Patient Spontanous Breathing and Patient connected to nasal cannula oxygen  Post-op Assessment: Report given to RN and Post -op Vital signs reviewed and stable  Post vital signs: Reviewed and stable  Last Vitals:  Vitals Value Taken Time  BP 130/71 09/03/21 1203  Temp    Pulse 61 09/03/21 1205  Resp 13 09/03/21 1205  SpO2 100 % 09/03/21 1205  Vitals shown include unvalidated device data.  Last Pain:  Vitals:   09/03/21 0946  TempSrc:   PainSc: 0-No pain         Complications: There were no known notable events for this encounter.

## 2021-09-03 NOTE — Discharge Instructions (Signed)
Post procedure care instructions No driving for 4 days. No lifting over 5 lbs for 1 week. No vigorous or sexual activity for 1 week. You may return to work/your usual activities on 09/11/21. Keep procedure site clean & dry. If you notice increased pain, swelling, bleeding or pus, call/return!  You may shower after 24 hours, but no soaking in baths/hot tubs/pools for 1 week.

## 2021-09-03 NOTE — Plan of Care (Signed)
  Problem: Education: Goal: Knowledge of General Education information will improve Description: Including pain rating scale, medication(s)/side effects and non-pharmacologic comfort measures Outcome: Progressing   Problem: Clinical Measurements: Goal: Ability to maintain clinical measurements within normal limits will improve Outcome: Progressing Goal: Cardiovascular complication will be avoided Outcome: Progressing   

## 2021-09-03 NOTE — H&P (Signed)
Electrophysiology Office Follow up Visit Note:     Date:  09/03/2021    ID:  Wayne Roberson, DOB [birth year], MRN [id number]   PCP:  [name], MD           [facility] Cardiologist:  [name], MD  [facility] Electrophysiologist:  Vickie Epley, MD      Interval History:     Wayne Roberson is a 76 y.o. male who presents for a follow up visit. They were last seen in clinic 01/30/2021. The patient has AF and was originally referred to discuss AF management and potential LAAO.    Since we met, he discussed the procedures with Dr Wendi Maya at [facility].    He is now s/p AF ablation 06/19/2021. He presents today for LAAO. He has been taking his AC. Continues to have problems with frequent nosebleeds.     Objective      Past Medical History:  Diagnosis Date   Arthritis      knees   COPD (chronic obstructive pulmonary disease) (HCC)      NEWLY DX , NOW RX'D INHALER PRN , NOT HAD TO USE YET    Deep vein thrombosis (DVT) of calf muscle vein (HCC) 3 YEARS AGO    LEFT CALF , UNSURE HOW IT WAS TREATED    Dyspnea      exercise   Hypertension     Prostate cancer ([place])     Tinnitus     Trace cataracts             Past Surgical History:  Procedure Laterality Date   CATARACT EXTRACTION, BILATERAL   2018   PROSTATE BIOPSY       RADIOACTIVE SEED IMPLANT N/A 03/01/2014    Procedure: RADIOACTIVE SEED IMPLANT;  Surgeon: [name], MD;  Location: [facility];  Service: Urology;  Laterality: N/A;   TOTAL KNEE ARTHROPLASTY Right 05/15/2018    Procedure: TOTAL KNEE ARTHROPLASTY;  Surgeon: [name], MD;  Location: [facility];  Service: Orthopedics;  Laterality: Right;  102mn   VENTRAL HERNIA REPAIR          Current Medications: Active Medications      Current Meds  Medication Sig   albuterol (PROVENTIL HFA;VENTOLIN HFA) 108 (90 Base) MCG/ACT inhaler Inhale 2 puffs into the lungs every 6 (six) hours as needed for wheezing or shortness of breath.   ELIQUIS 5 MG TABS  tablet TAKE 1 TABLET TWICE A DAY   fluocinonide (LIDEX) 0.05 % external solution Apply 1 application topically daily as needed (rash).    fluticasone (FLONASE) 50 MCG/ACT nasal spray Place 2 sprays into both nostrils daily as needed for allergies or rhinitis.   LORazepam (ATIVAN) 0.5 MG tablet Take 0.5 mg by mouth daily as needed for sleep.    metoprolol tartrate (LOPRESSOR) 50 MG tablet Take 1 tablet (50 mg total) by mouth 2 (two) times daily.   telmisartan (MICARDIS) 80 MG tablet Take 80 mg by mouth daily.        Allergies:   Penicillins    Social History         Socioeconomic History   Marital status: Married      Spouse name: Not on file   Number of children: Not on file   Years of education: Not on file   Highest education level: Not on file  Occupational History   Not on file  Tobacco Use   Smoking status: Former  Packs/day: 2.00      Years: 42.00      Pack years: 84.00      Types: Cigarettes      Quit date: 03/29/2004      Years since quitting: 17.0   Smokeless tobacco: Never  Vaping Use   Vaping Use: Never used  Substance and Sexual Activity   Alcohol use: Yes      Alcohol/week: 12.0 standard drinks      Types: 10 Glasses of wine, 2 Standard drinks or equivalent per week      Comment: 2 glasses of wine evening of 05/14/2018   Drug use: No   Sexual activity: Never  Other Topics Concern   Not on file  Social History Narrative   Not on file    Social Determinants of Health    Financial Resource Strain: Not on file  Food Insecurity: Not on file  Transportation Needs: Not on file  Physical Activity: Not on file  Stress: Not on file  Social Connections: Not on file      Family History: The patient's family history includes Heart failure in his father and mother. There is no history of Cancer.   ROS:   Please see the history of present illness.    All other systems reviewed and are negative.   EKGs/Labs/Other Studies Reviewed:     The following  studies were reviewed today:     EKG:  The ekg ordered today demonstrates atrial fibrillation with rapid ventricular rate.  Ventricular rate is 112 bpm.   Recent Labs: No results found for requested labs within last 8760 hours.  Recent Lipid Panel Labs (Brief)  No results found for: CHOL, TRIG, HDL, CHOLHDL, VLDL, LDLCALC, LDLDIRECT     Physical Exam:     VS:  BP 150/77   Pulse (!) 63   Ht 5' 10.5" (1.791 m)   Wt 194 lb 12.8 oz (88.4 kg)   SpO2 93%   BMI 27.56 kg/m         Wt Readings from Last 3 Encounters:  03/26/21 194 lb 12.8 oz (88.4 kg)  01/30/21 198 lb (89.8 kg)  12/26/20 198 lb (89.8 kg)      GEN:  Well nourished, well developed in no acute distress HEENT: Normal NECK: No JVD; No carotid bruits LYMPHATICS: No lymphadenopathy CARDIAC: Irregularly irregular, tachycardic, no murmurs, rubs, gallops RESPIRATORY:  Clear to auscultation without rales, wheezing or rhonchi  ABDOMEN: Soft, non-tender, non-distended MUSCULOSKELETAL:  No edema; No deformity  SKIN: Warm and dry NEUROLOGIC:  Alert and oriented x 3 PSYCHIATRIC:  Normal affect            Assessment ASSESSMENT:     1. Paroxysmal atrial fibrillation (HCC)   2. Nosebleed   3. Primary hypertension     PLAN:     In order of problems listed above:   #Paroxysmal atrial fibrillation Doing well after his AF ablation on 06/19/2021. He has done well without recurrence. Planning for [place] today given history of frequent significant nosebleeds.    Procedural risks for the Watchman implant have been reviewed with the patient including a 0.5% risk of stroke, <1% risk of perforation and <1% risk of device embolization. Other risks include bleeding, vascular damage, tamponade, worsening renal function, and death. The patient understands these risk and wishes to proceed.        Signed, Lars Mage, MD, [facility], [facility] 09/03/2021 Electrophysiology [facility]

## 2021-09-03 NOTE — Anesthesia Procedure Notes (Signed)
Procedure Name: Intubation Date/Time: 09/03/2021 10:43 AM  Performed by: Barrington Ellison, CRNAPre-anesthesia Checklist: Patient identified, Emergency Drugs available, Suction available and Patient being monitored Patient Re-evaluated:Patient Re-evaluated prior to induction Oxygen Delivery Method: Circle System Utilized Preoxygenation: Pre-oxygenation with 100% oxygen Induction Type: IV induction Ventilation: Mask ventilation without difficulty Laryngoscope Size: Mac and 4 Grade View: Grade I Tube type: Oral Tube size: 7.5 mm Number of attempts: 1 Airway Equipment and Method: Stylet and Oral airway Placement Confirmation: ETT inserted through vocal cords under direct vision, positive ETCO2 and breath sounds checked- equal and bilateral Secured at: 22 cm Tube secured with: Tape Dental Injury: Teeth and Oropharynx as per pre-operative assessment

## 2021-09-04 ENCOUNTER — Telehealth: Payer: Self-pay | Admitting: Cardiology

## 2021-09-04 ENCOUNTER — Encounter (HOSPITAL_COMMUNITY): Payer: Self-pay | Admitting: Cardiology

## 2021-09-04 ENCOUNTER — Telehealth: Payer: Self-pay

## 2021-09-04 NOTE — Telephone Encounter (Signed)
Patient had unsuccessful LAAO implant yesterday and was discharged.  He states he is doing well today. Groins have no S/S of infection or bleeding. Confirmed visit with Dr. Quentin Ore 09/24/2021. He was grateful for call and agrees with plan.

## 2021-09-04 NOTE — Telephone Encounter (Signed)
Pt was told by Dr. Quentin Ore to reach out to [facility] to schedule a loop recorder procedure.

## 2021-09-04 NOTE — Telephone Encounter (Signed)
I spoke with patient.  He has appointment on September 24, 2021 and was wondering if there were any options for appointment next week.

## 2021-09-07 NOTE — Telephone Encounter (Signed)
Notified the patient that I put him on the wait list for Dr. Quentin Ore but at this time there is not a sooner appt. Patient verbalized understanding and agreement.

## 2021-09-24 ENCOUNTER — Ambulatory Visit (INDEPENDENT_AMBULATORY_CARE_PROVIDER_SITE_OTHER): Payer: Medicare Other | Admitting: Cardiology

## 2021-09-24 VITALS — BP 138/74 | HR 70 | Ht 70.0 in | Wt 184.0 lb

## 2021-09-24 DIAGNOSIS — I480 Paroxysmal atrial fibrillation: Secondary | ICD-10-CM

## 2021-09-24 DIAGNOSIS — I10 Essential (primary) hypertension: Secondary | ICD-10-CM

## 2021-09-24 DIAGNOSIS — Z7901 Long term (current) use of anticoagulants: Secondary | ICD-10-CM | POA: Diagnosis not present

## 2021-09-24 NOTE — Progress Notes (Signed)
Electrophysiology Office Follow up Visit Note:    Date:  09/24/2021   ID:  Wayne Roberson, DOB [birth year], MRN [id number]  PCP:  [name], MD  [facility] Cardiologist:  [name], MD  [facility] Electrophysiologist:  Vickie Epley, MD    Interval History:    Wayne Roberson is a 76 y.o. male who presents for a follow up visit. They were last seen in clinic 03/26/2021.  Since their last appointment, they underwent atrial fibrillation ablation on 05/2021. They followed up with Wayne Peals, PA 07/17/21 and was doing well from a cardiovascular perspective aside from some fatigue felt to be related to his beta blocker.  He underwent LAAO on 09/03/21 which was unsuccessful. Aborted implant of a Watchman 50m/30mm devices due to poor closure of the appendage secondary to difficult anatomy (chicken wing with shallow initial segment). As of 09/04/21 he had no signs of infection or bleeding. He was scheduled for follow-up today.  Today, he is accompanied by a family member. He states he is feeling about the same, still with fatigue and shortness of breath.  They deny any palpitations, chest pain, or peripheral edema. No lightheadedness, headaches, syncope, orthopnea, or PND.      Past Medical History:  Diagnosis Date   Arthritis    knees   [place]D [place]) [place]    NEWLY DX , NOW RX'D INHALER PRN , NOT HAD TO USE YET    Deep vein thrombosis (DVT) of calf muscle vein (HCC) 3 YEARS AGO   LEFT CALF , UNSURE HOW IT WAS TREATED    Dyspnea    exercise   Hypertension    [place]    Tinnitus    Trace cataracts     Past Surgical History:  Procedure Laterality Date   ATRIAL FIBRILLATION ABLATION N/A 06/19/2021   Procedure: ATRIAL FIBRILLATION ABLATION;  Surgeon: LVickie Epley MD;  [place] [facility];  Service: Cardiovascular;  Laterality: N/A;   CATARACT EXTRACTION, BILATERAL  2018   LEFT ATRIAL APPENDAGE OCCLUSION N/A  09/03/2021   Procedure: LEFT ATRIAL APPENDAGE OCCLUSION;  Surgeon: LVickie Epley MD;  [place] [facility];  Service: Cardiovascular;  Laterality: N/A;   PROSTATE BIOPSY     RADIOACTIVE SEED IMPLANT N/A 03/01/2014   Procedure: RADIOACTIVE SEED IMPLANT;  Surgeon: MArvil Persons MD;  [facility]  Service: Urology;  Laterality: N/A;   TEE WITHOUT CARDIOVERSION N/A 09/03/2021   Procedure: TRANSESOPHAGEAL ECHOCARDIOGRAM (TEE);  Surgeon: LVickie Epley MD;  [facility];  Service: Cardiovascular;  Laterality: N/A;   TOTAL KNEE ARTHROPLASTY Right 05/15/2018   Procedure: TOTAL KNEE ARTHROPLASTY;  Surgeon: AGaynelle Arabian MD;  Location: [facility];  Service: Orthopedics;  Laterality: Right;  510m   VENTRAL HERNIA REPAIR      Current Medications: No outpatient medications have been marked as taking for the 09/24/21 encounter (Office Visit) with LaVickie EpleyMD.     Allergies:   Penicillins   Social History   Socioeconomic History   Marital status: Married    Spouse name: Not on file   Number of children: Not on file   Years of education: Not on file   Highest education level: Not on file  Occupational History   Not on file  Tobacco Use   Smoking status: Former    Packs/day: 2.00    Years: 42.00    Total pack years: 84.00  Types: Cigarettes    Quit date: 03/29/2004    Years since quitting: 17.5   Smokeless tobacco: Never  Vaping Use   Vaping Use: Never used  Substance and Sexual Activity   Alcohol use: Yes    Alcohol/week: 12.0 standard drinks of alcohol    Types: 10 Glasses of wine, 2 Standard drinks or equivalent per week   Drug use: No   Sexual activity: Never  Other Topics Concern   Not on file  Social History Narrative   Not on file   Social Determinants of Health   Financial Resource Strain: Not on file  Food Insecurity: Not on file  Transportation Needs: Not on file  Physical Activity: Not on file  Stress: Not  on file  Social Connections: Not on file     Family History: The patient's family history includes Heart failure in his father and mother. There is no history of Cancer.  ROS:   Please see the history of present illness.    (+) Fatigue (+) Shortness of breath All other systems reviewed and are negative.  EKGs/Labs/Other Studies Reviewed:    The following studies were reviewed today:  09/07/2021  Echo TEE:  1. Attempt to place 27 mm and 31 mm FLX into anterior chicken wing  appendage Neither device would sit deep enough with skirt lead and large  medial shoulder Dr Quentin Ore decided not to place any device.   2. Extensive 3D imaging of appendage performed.   3. Left ventricular ejection fraction, by estimation, is 55 to 60%. The  left ventricle has normal function.   4. Right ventricular systolic function is normal. The right ventricular  size is normal.   5. Left atrial size was mildly dilated. No left atrial/left atrial  appendage thrombus was detected.   6. Trivial pericardial effusion lateral to RA/RV prior to procedure with  no change post . The pericardial effusion is localized near the right  atrium and localized near the right ventricle.   7. The mitral valve is normal in structure. Trivial mitral valve  regurgitation.   8. The aortic valve is tricuspid. Aortic valve regurgitation is not  visualized. No aortic stenosis is present.   9. Left to right shunt post transeptal.   09/03/2021  LAAO: CONCLUSIONS:  1. Aborted implant of a Watchman 19m/30mm devices due to poor closure of the appendage secondary to difficult anatomy (chicken wing with shallow initial segment).  2. TEE demonstrating no LAA thrombus 3. No early apparent complications.   06/19/2021  Atrial Fibrillation Ablation: CONCLUSIONS: 1. Successful PVI 2. Successful ablation of the cavotricuspid isthmus for typical atrial flutter 3. Intracardiac echo reveals trivial pericardial effusion, normal LV function,  normal  LA architecture 4. No early apparent complications. 5. Colchicine 0.6 mg PO BID x 5 days 6. Protonix '40mg'$  PO daily x 45 days  EKG:  EKG is personally reviewed.  09/24/2021 - EKG was not ordered.   Recent Labs: 08/26/2021: BUN 14; Creatinine, Ser 0.85; Hemoglobin 14.9; Platelets 219; Potassium 4.8; Sodium 140   Recent Lipid Panel No results found for: "CHOL", "TRIG", "HDL", "CHOLHDL", "VLDL", "LDLCALC", "LDLDIRECT"  Physical Exam:    VS:  BP 138/74 (BP Location: Left Arm, Patient Position: Sitting, Cuff Size: Normal)   Pulse 70   Ht '5\' 10"'$  (1.778 m)   Wt 184 lb (83.5 kg)   BMI 26.40 kg/m     Wt Readings from Last 3 Encounters:  09/24/21 184 lb (83.5 kg)  09/03/21 198 lb (89.8 kg)  08/26/21 198 lb (89.8 kg)     GEN: Well nourished, well developed in no acute distress HEENT: Normal NECK: No JVD; No carotid bruits LYMPHATICS: No lymphadenopathy CARDIAC: RRR, no murmurs, rubs, gallops RESPIRATORY:  Clear to auscultation without rales, wheezing or rhonchi  ABDOMEN: Soft, non-tender, non-distended MUSCULOSKELETAL:  No edema; No deformity  SKIN: Warm and dry NEUROLOGIC:  Alert and oriented x 3 PSYCHIATRIC:  Normal affect        ASSESSMENT:    1. Paroxysmal atrial fibrillation (HCC)   2. Primary hypertension   3. Chronic anticoagulation    PLAN:    In order of problems listed above:  #Paroxysmal atrial fibrillation  no recurrence after his ablation on June 19, 2021.  EKG today shows normal rhythm.  He continues to take Eliquis for stroke prophylaxis.  Unfortunately his left atrial appendage anatomy was not suitable for watchman implant.  I discussed alternative options including the amulet device and using a loop recorder for monitoring surveillance and adopting a "pill in the pocket" style anticoagulation strategy.  We discussed that this is off label but still offers some level of protection given the 24/7 coverage of the loop recorder rhythm surveillance.  He  would like to proceed with loop recorder implant.  After today's loop recorder implant, I would like to monitor his rhythm while still taking the Eliquis for 3 months.  If no recurrence of atrial fibrillation during the 53-monthmonitoring period, he can discontinue Eliquis and start aspirin 81 mg by mouth once daily with ongoing rhythm surveillance with a loop recorder monitor.  I will have him see an APP in 3 months who can review his loop recorder tracings and make this medication change if indicated.   Medication Adjustments/Labs and Tests Ordered: Current medicines are reviewed at length with the patient today.  Concerns regarding medicines are outlined above.  No orders of the defined types were placed in this encounter.  No orders of the defined types were placed in this encounter.   I,Wayne Roberson,acting as a sEducation administratorfor CVickie Epley MD.,have documented all relevant documentation on the behalf of CVickie Epley MD,as directed by  CVickie Epley MD while in the presence of CVickie Epley MD.  I, CVickie Epley MD, have reviewed all documentation for this visit. The documentation on 09/24/21 for the exam, diagnosis, procedures, and orders are all accurate and complete.   Signed, Wayne Mage MD, [facility]6/29/2023 9:03 AM    Electrophysiology [facility]   ----------------------------  SURGEON:  Wayne Mage MD    PREPROCEDURE DIAGNOSIS:  Atrial fibrillation, palpitations    POSTPROCEDURE DIAGNOSIS:  Atrial fibrillation, palpitations     PROCEDURES:   1. Implantable loop recorder implantation    INTRODUCTION:  Wayne OLIVARESis a 76y.o. male with a history of palpitations and atrial fibrillation who presents today for implantable loop implantation.  He had an atrial fibrillation ablation June 19, 2021 and has not had a recurrence of arrhythmia.  He would like to avoid long-term exposure to anticoagulation.  We  discussed using a loop recorder for ongoing rhythm surveillance and he presents today to have this implanted.  The monitoring costs associated with loop recorder monitoring have been discussed with the patient.    DESCRIPTION OF PROCEDURE:  Informed written consent was obtained.  The patient required no sedation for the procedure today.  Mapping over the patient's chest was performed to identify the area where electrograms were  most prominent for ILR recording.  This area was found to be the left parasternal region over the 3rd-4th intercostal space. The patients left chest was therefore prepped and draped in the usual sterile fashion. The skin overlying the left parasternal region was infiltrated with lidocaine for local analgesia.  A 0.5-cm incision was made over the left parasternal region over the 3rd intercostal space.  A subcutaneous ILR pocket was fashioned using a combination of sharp and blunt dissection.  An Abbott assert IQ EL+ implantable loop recorder (serial [id number]) was then placed into the pocket  R waves were very prominent and measured >0.8m.  Steri- Strips and a sterile dressing were then applied.  There were no early apparent complications.     CONCLUSIONS:   1. Successful implantation of an Abbott Assert IQ EL+ implantable loop recorder for atrial fibrillation monitoring  2. No early apparent complications.    CLysbeth GalasT. LQuentin Ore MD, [facility] Surgery CenterCardiac Electrophysiology

## 2021-09-24 NOTE — Patient Instructions (Addendum)
Medication Instructions:  Your physician recommends that you continue on your current medications as directed. Please refer to the Current Medication list given to you today.  Labwork: None ordered.  Testing/Procedures: None ordered.  Follow-Up:  Your physician wants you to follow-up in: 3 months with Tommye Standard or Oda Kilts.     Implantable Loop Recorder Placement, Care After This sheet gives you information about how to care for yourself after your procedure. Your health care provider may also give you more specific instructions. If you have problems or questions, contact your health care provider. What can I expect after the procedure? After the procedure, it is common to have: Soreness or discomfort near the incision. Some swelling or bruising near the incision.  Follow these instructions at home: Incision care  Monitor your cardiac device site for redness, swelling, and drainage. Call the device clinic at [phone] if you experience these symptoms or fever/chills.  Keep the large square bandage on your site for 24 hours and then you may remove it yourself. Keep the steri-strips underneath in place.   You may shower after 72 hours / 3 days from your procedure with the steri-strips in place. They will usually fall off on their own, or may be removed after 10 days. Pat dry.   Avoid lotions, ointments, or perfumes over your incision until it is well-healed.  Please do not submerge in water until your site is completely healed.   Your device is MRI compatible.   Remote monitoring is used to monitor your cardiac device from home. This monitoring is scheduled every month by our office. It allows [place] to keep an eye on the function of your device to ensure it is working properly.  If your wound site starts to bleed apply pressure.      If you have any questions/concerns please call the device clinic at [phone].  Activity  Return to your normal  activities.  General instructions Follow instructions from your health care provider about how to manage your implantable loop recorder and transmit the information. Learn how to activate a recording if this is necessary for your type of device. You may go through a metal detection gate, and you may let someone hold a metal detector over your chest. Show your ID card if needed. Do not have an MRI unless you check with your health care provider first. Take over-the-counter and prescription medicines only as told by your health care provider. Keep all follow-up visits as told by your health care provider. This is important. Contact a health care provider if: You have redness, swelling, or pain around your incision. You have a fever. You have pain that is not relieved by your pain medicine. You have triggered your device because of fainting (syncope) or because of a heartbeat that feels like it is racing, slow, fluttering, or skipping (palpitations). Get help right away if you have: Chest pain. Difficulty breathing. Summary After the procedure, it is common to have soreness or discomfort near the incision. Change your dressing as told by your health care provider. Follow instructions from your health care provider about how to manage your implantable loop recorder and transmit the information. Keep all follow-up visits as told by your health care provider. This is important. This information is not intended to replace advice given to you by your health care provider. Make sure you discuss any questions you have with your health care provider. Document Released: 02/24/2015 Document Revised: 04/30/2017 Document Reviewed: 04/30/2017 Elsevier Patient Education  [place].

## 2021-10-08 DIAGNOSIS — C61 Malignant neoplasm of prostate: Secondary | ICD-10-CM | POA: Diagnosis not present

## 2021-10-15 DIAGNOSIS — R350 Frequency of micturition: Secondary | ICD-10-CM | POA: Diagnosis not present

## 2021-10-15 DIAGNOSIS — C61 Malignant neoplasm of prostate: Secondary | ICD-10-CM | POA: Diagnosis not present

## 2021-10-15 DIAGNOSIS — N401 Enlarged prostate with lower urinary tract symptoms: Secondary | ICD-10-CM | POA: Diagnosis not present

## 2021-10-26 ENCOUNTER — Ambulatory Visit (INDEPENDENT_AMBULATORY_CARE_PROVIDER_SITE_OTHER): Payer: Medicare Other

## 2021-10-26 DIAGNOSIS — I480 Paroxysmal atrial fibrillation: Secondary | ICD-10-CM | POA: Diagnosis not present

## 2021-10-27 LAB — CUP PACEART REMOTE DEVICE CHECK
Date Time Interrogation Session: 20230731024145
Implantable Pulse Generator Implant Date: 20230629
Pulse Gen Serial Number: 511010609

## 2021-11-05 DIAGNOSIS — D3132 Benign neoplasm of left choroid: Secondary | ICD-10-CM | POA: Diagnosis not present

## 2021-11-05 DIAGNOSIS — H35352 Cystoid macular degeneration, left eye: Secondary | ICD-10-CM | POA: Diagnosis not present

## 2021-11-05 DIAGNOSIS — Z961 Presence of intraocular lens: Secondary | ICD-10-CM | POA: Diagnosis not present

## 2021-11-05 DIAGNOSIS — H353221 Exudative age-related macular degeneration, left eye, with active choroidal neovascularization: Secondary | ICD-10-CM | POA: Diagnosis not present

## 2021-11-05 DIAGNOSIS — H353111 Nonexudative age-related macular degeneration, right eye, early dry stage: Secondary | ICD-10-CM | POA: Diagnosis not present

## 2021-11-23 ENCOUNTER — Telehealth: Payer: Self-pay

## 2021-11-23 NOTE — Telephone Encounter (Signed)
LM on home and cell VM with general info to return call back to the device clinic.   LINQ alert received.  AF alert ongoing from 8/26 @ 23:42, mean HR 130 Burden 1%, Eliquis, Metoprolol Route to triage LA  Need to follow up with patient regarding how he is feeling, ensure taking medications as prescribed and get him to send [place] a transmission today to further assess.

## 2021-11-24 NOTE — Telephone Encounter (Signed)
Spoke with patient, patient unaware of AF, patient reported taking Elqiuis, patient stated that his Metoprolol dose was lowered to 12.'5mg'$  BID

## 2021-11-26 ENCOUNTER — Ambulatory Visit (INDEPENDENT_AMBULATORY_CARE_PROVIDER_SITE_OTHER): Payer: Medicare Other

## 2021-11-26 DIAGNOSIS — I480 Paroxysmal atrial fibrillation: Secondary | ICD-10-CM | POA: Diagnosis not present

## 2021-11-26 LAB — CUP PACEART REMOTE DEVICE CHECK
Date Time Interrogation Session: 20230831021213
Implantable Pulse Generator Implant Date: 20230629
Pulse Gen Serial Number: 511010609

## 2021-11-28 NOTE — Progress Notes (Signed)
Merlin loop recorder 

## 2021-12-04 ENCOUNTER — Other Ambulatory Visit: Payer: Self-pay | Admitting: Cardiology

## 2021-12-04 NOTE — Telephone Encounter (Signed)
Prescription refill request for Eliquis received. Indication:Afib Last office visit:6/23 Scr:0.8 Age: 76 Weight:83.5 kg  Prescription refilled

## 2021-12-17 DIAGNOSIS — D3132 Benign neoplasm of left choroid: Secondary | ICD-10-CM | POA: Diagnosis not present

## 2021-12-17 DIAGNOSIS — H35352 Cystoid macular degeneration, left eye: Secondary | ICD-10-CM | POA: Diagnosis not present

## 2021-12-17 DIAGNOSIS — Z961 Presence of intraocular lens: Secondary | ICD-10-CM | POA: Diagnosis not present

## 2021-12-17 DIAGNOSIS — H353221 Exudative age-related macular degeneration, left eye, with active choroidal neovascularization: Secondary | ICD-10-CM | POA: Diagnosis not present

## 2021-12-17 DIAGNOSIS — H353111 Nonexudative age-related macular degeneration, right eye, early dry stage: Secondary | ICD-10-CM | POA: Diagnosis not present

## 2021-12-17 NOTE — Progress Notes (Signed)
Carelink Summary Report / Loop Recorder 

## 2021-12-23 NOTE — Progress Notes (Signed)
PCP:  [name], MD Primary Cardiologist: [name], MD Electrophysiologist: Vickie Epley, MD   Wayne Roberson is a 76 y.o. male seen today for Vickie Epley, MD for routine electrophysiology followup. Since last being seen in our clinic the patient reports doing well overall. He has had a little fatigue. He is able to do his ADLs without difficulty. He has mild DOE when working out in his yard. He denies chest pain, palpitations, PND, orthopnea, nausea, vomiting, dizziness, syncope, edema, weight gain, or early satiety.  He was unaware of the episode he had back in August.   Past Medical History:  Diagnosis Date   Arthritis    knees   COPD (chronic obstructive pulmonary disease) ([place])    NEWLY DX , NOW RX'D INHALER PRN , NOT HAD TO USE YET    Deep vein thrombosis (DVT) of calf muscle vein (HCC) 3 YEARS AGO   LEFT CALF , UNSURE HOW IT WAS TREATED    Dyspnea    exercise   Hypertension    Prostate cancer ([place])    Tinnitus    Trace cataracts    Past Surgical History:  Procedure Laterality Date   ATRIAL FIBRILLATION ABLATION N/A 06/19/2021   Procedure: ATRIAL FIBRILLATION ABLATION;  Surgeon: Vickie Epley, MD;  Location: [facility];  Service: Cardiovascular;  Laterality: N/A;   CATARACT EXTRACTION, BILATERAL  2018   LEFT ATRIAL APPENDAGE OCCLUSION N/A 09/03/2021   Procedure: LEFT ATRIAL APPENDAGE OCCLUSION;  Surgeon: Vickie Epley, MD;  Location: [facility];  Service: Cardiovascular;  Laterality: N/A;   PROSTATE BIOPSY     RADIOACTIVE SEED IMPLANT N/A 03/01/2014   Procedure: RADIOACTIVE SEED IMPLANT;  Surgeon: [name], MD;  Location[facility];  Service: Urology;  Laterality: N/A;   TEE WITHOUT CARDIOVERSION N/A 09/03/2021   Procedure: TRANSESOPHAGEAL ECHOCARDIOGRAM (TEE);  Surgeon: Vickie Epley, MD;  Location: [place] [facility];  Service: Cardiovascular;  Laterality: N/A;   TOTAL KNEE ARTHROPLASTY Right 05/15/2018    Procedure: TOTAL KNEE ARTHROPLASTY;  Surgeon: [name], MD;  Location: [facility];  Service: Orthopedics;  Laterality: Right;  93mn   VENTRAL HERNIA REPAIR      Current Outpatient Medications  Medication Sig Dispense Refill   albuterol (PROVENTIL HFA;VENTOLIN HFA) 108 (90 Base) MCG/ACT inhaler Inhale 2 puffs into the lungs every 6 (six) hours as needed for wheezing or shortness of breath. 1 Inhaler 6   carboxymethylcellulose (REFRESH PLUS) 0.5 % SOLN 1 drop 3 (three) times daily as needed (dry eyes).     ELIQUIS 5 MG TABS tablet TAKE 1 TABLET TWICE A DAY 180 tablet 1   fluocinonide (LIDEX) 0.05 % external solution Apply 1 application. topically daily. After shower     Glucosamine-Chondroitin (OSTEO BI-FLEX REGULAR STRENGTH PO) Take 1 tablet by mouth in the morning and at bedtime.     LORazepam (ATIVAN) 0.5 MG tablet Take 0.5 mg by mouth at bedtime as needed for sleep.     metoprolol tartrate (LOPRESSOR) 25 MG tablet Take 1 tablet (25 mg total) by mouth 2 (two) times daily. (Patient taking differently: Take 25 mg by mouth 2 (two) times daily. Take 1/2 tablet by mouth two times daily) 60 tablet 3   Multiple Vitamin (MULTIVITAMIN WITH MINERALS) TABS tablet Take 1 tablet by mouth daily.     Multiple Vitamins-Minerals (PRESERVISION AREDS PO) Take 1 capsule by mouth in the morning and at bedtime.  prednisoLONE acetate (PRED FORTE) 1 % ophthalmic suspension Place 1 drop into the left eye 2 (two) times daily. Noon and Bedtime     telmisartan (MICARDIS) 80 MG tablet TAKE 1 TABLET DAILY 90 tablet 3   vitamin C (ASCORBIC ACID) 500 MG tablet Take 500 mg by mouth daily.     No current facility-administered medications for this visit.   Facility-Administered Medications Ordered in Other Visits  Medication Dose Route Frequency Provider Last Rate Last Admin   regadenoson (LEXISCAN) injection SOLN 0.4 mg  0.4 mg Intravenous Once Turner, Traci R, MD       technetium tetrofosmin (TC-MYOVIEW) injection  10.2 millicurie  58.5 millicurie Intravenous Once PRN Sueanne Margarita, MD        Allergies  Allergen Reactions   Penicillins     Did it involve swelling of the face/tongue/throat, SOB, or low BP? Unknown Did it involve sudden or severe rash/hives, skin peeling, or any reaction on the inside of your mouth or nose? Unknown Did you need to seek medical attention at a hospital or doctor's office? No When did it last happen?      Childhood allergy If all above answers are "NO", may proceed with cephalosporin use.     Social History   Socioeconomic History   Marital status: Married    Spouse name: Not on file   Number of children: Not on file   Years of education: Not on file   Highest education level: Not on file  Occupational History   Not on file  Tobacco Use   Smoking status: Former    Packs/day: 2.00    Years: 42.00    Total pack years: 84.00    Types: Cigarettes    Quit date: 03/29/2004    Years since quitting: 17.7   Smokeless tobacco: Never  Vaping Use   Vaping Use: Never used  Substance and Sexual Activity   Alcohol use: Yes    Alcohol/week: 12.0 standard drinks of alcohol    Types: 10 Glasses of wine, 2 Standard drinks or equivalent per week   Drug use: No   Sexual activity: Never  Other Topics Concern   Not on file  Social History Narrative   Not on file   Social Determinants of Health   Financial Resource Strain: Not on file  Food Insecurity: Not on file  Transportation Needs: Not on file  Physical Activity: Not on file  Stress: Not on file  Social Connections: Not on file  Intimate Partner Violence: Not on file     Review of Systems: All other systems reviewed and are otherwise negative except as noted above.  Physical Exam: Vitals:   12/25/21 0802  BP: 122/86  Pulse: 76  Weight: 202 lb (91.6 kg)  Height: '5\' 11"'$  (1.803 m)    GEN- The patient is well appearing, alert and oriented x 3 today.   HEENT: normocephalic, atraumatic; sclera clear,  conjunctiva pink; hearing intact; oropharynx clear; neck supple, no JVP Lymph- no cervical lymphadenopathy Lungs- Clear to ausculation bilaterally, normal work of breathing.  No wheezes, rales, rhonchi Heart- Regular rate and rhythm, no murmurs, rubs or gallops, PMI not laterally displaced GI- soft, non-tender, non-distended, bowel sounds present, no hepatosplenomegaly Extremities- Trace peripheral edema. no clubbing or cyanosis; DP/PT/radial pulses 2+ bilaterally MS- no significant deformity or atrophy Skin- warm and dry, no rash or lesion Psych- euthymic mood, full affect Neuro- strength and sensation are intact  EKG is not ordered.   Additional studies reviewed  include: Previous EP office notes.   Assessment and Plan:  1. Paroxysmal atrial fibrillation S/p ablation 05/2021 St. Jude Loop recorder implanted 09/24/2021 Pt HAS had recurrent AF on monitor, and thus at this time we would recommend continuing his [place].  We discussed this at length today.   He asked about switching to coumadin. Explained that though it is the cheapest, both Eliquis and Xarelto are superior, and it requires frequent monitoring as well as dietary modifications.  We will see if he is a candidate for OCEANIC-AF  Follow up with Dr. Quentin Ore in 6 months  Shirley Friar, PA-C  12/25/21 8:20 AM

## 2021-12-25 ENCOUNTER — Ambulatory Visit: Payer: Medicare Other | Attending: Student | Admitting: Student

## 2021-12-25 ENCOUNTER — Encounter: Payer: Self-pay | Admitting: Student

## 2021-12-25 VITALS — BP 122/86 | HR 76 | Ht 71.0 in | Wt 202.0 lb

## 2021-12-25 DIAGNOSIS — Z7901 Long term (current) use of anticoagulants: Secondary | ICD-10-CM | POA: Diagnosis not present

## 2021-12-25 DIAGNOSIS — I480 Paroxysmal atrial fibrillation: Secondary | ICD-10-CM | POA: Insufficient documentation

## 2021-12-25 NOTE — Patient Instructions (Signed)
Medication Instructions:  Your physician recommends that you continue on your current medications as directed. Please refer to the Current Medication list given to you today.  *If you need a refill on your cardiac medications before your next appointment, please call your pharmacy*   Lab Work: TODAY: BMET, CBC  If you have labs (blood work) drawn today and your tests are completely normal, you will receive your results only by: MyChart Message (if you have MyChart) OR A paper copy in the mail If you have any lab test that is abnormal or we need to change your treatment, we will call you to review the results.   Follow-Up: At [facility], you and your health needs are our priority.  As part of our continuing mission to provide you with exceptional heart care, we have created designated Provider Care Teams.  These Care Teams include your primary Cardiologist (physician) and Advanced Practice Providers (APPs -  Physician Assistants and Nurse Practitioners) who all work together to provide you with the care you need, when you need it.  Your next appointment:   6 month(s)  The format for your next appointment:   In Person  Provider:   Cameron Lambert, MD    Important Information About Sugar       

## 2021-12-26 LAB — BASIC METABOLIC PANEL
BUN/Creatinine Ratio: 18 (ref 10–24)
BUN: 15 mg/dL (ref 8–27)
CO2: 24 mmol/L (ref 20–29)
Calcium: 9.1 mg/dL (ref 8.6–10.2)
Chloride: 102 mmol/L (ref 96–106)
Creatinine, Ser: 0.85 mg/dL (ref 0.76–1.27)
Glucose: 106 mg/dL — ABNORMAL HIGH (ref 70–99)
Potassium: 4.7 mmol/L (ref 3.5–5.2)
Sodium: 141 mmol/L (ref 134–144)
eGFR: 89 mL/min/{1.73_m2} (ref >59)

## 2021-12-26 LAB — CBC
Hematocrit: 43.5 % (ref 37.5–51.0)
Hemoglobin: 14.8 g/dL (ref 13.0–17.7)
MCH: 29.8 pg (ref 26.6–33.0)
MCHC: 34 g/dL (ref 31.5–35.7)
MCV: 88 fL (ref 79–97)
Platelets: 205 10*3/uL (ref 150–450)
RBC: 4.96 x10E6/uL (ref 4.14–5.80)
RDW: 13.2 % (ref 11.6–15.4)
WBC: 7.1 10*3/uL (ref 3.4–10.8)

## 2021-12-28 ENCOUNTER — Ambulatory Visit (INDEPENDENT_AMBULATORY_CARE_PROVIDER_SITE_OTHER): Payer: Medicare Other

## 2021-12-28 DIAGNOSIS — I480 Paroxysmal atrial fibrillation: Secondary | ICD-10-CM

## 2021-12-28 LAB — CUP PACEART REMOTE DEVICE CHECK
Date Time Interrogation Session: 20231001020151
Implantable Pulse Generator Implant Date: 20230629
Pulse Gen Serial Number: 511010609

## 2022-01-10 DIAGNOSIS — Z23 Encounter for immunization: Secondary | ICD-10-CM | POA: Diagnosis not present

## 2022-01-11 NOTE — Progress Notes (Signed)
Carelink Summary Report / Loop Recorder 

## 2022-01-20 DIAGNOSIS — R748 Abnormal levels of other serum enzymes: Secondary | ICD-10-CM | POA: Diagnosis not present

## 2022-01-27 LAB — CUP PACEART REMOTE DEVICE CHECK
Date Time Interrogation Session: 20231101020419
Implantable Pulse Generator Implant Date: 20230629
Pulse Gen Serial Number: 511010609

## 2022-01-28 ENCOUNTER — Ambulatory Visit (INDEPENDENT_AMBULATORY_CARE_PROVIDER_SITE_OTHER): Payer: Medicare Other

## 2022-01-28 DIAGNOSIS — I480 Paroxysmal atrial fibrillation: Secondary | ICD-10-CM | POA: Diagnosis not present

## 2022-02-02 DIAGNOSIS — H35033 Hypertensive retinopathy, bilateral: Secondary | ICD-10-CM | POA: Diagnosis not present

## 2022-02-02 DIAGNOSIS — H40013 Open angle with borderline findings, low risk, bilateral: Secondary | ICD-10-CM | POA: Diagnosis not present

## 2022-02-02 DIAGNOSIS — H04123 Dry eye syndrome of bilateral lacrimal glands: Secondary | ICD-10-CM | POA: Diagnosis not present

## 2022-02-02 DIAGNOSIS — H3581 Retinal edema: Secondary | ICD-10-CM | POA: Diagnosis not present

## 2022-02-08 NOTE — Progress Notes (Signed)
Carelink Summary Report / Loop Recorder 

## 2022-02-11 DIAGNOSIS — H35352 Cystoid macular degeneration, left eye: Secondary | ICD-10-CM | POA: Diagnosis not present

## 2022-02-11 DIAGNOSIS — H353221 Exudative age-related macular degeneration, left eye, with active choroidal neovascularization: Secondary | ICD-10-CM | POA: Diagnosis not present

## 2022-02-11 DIAGNOSIS — H353111 Nonexudative age-related macular degeneration, right eye, early dry stage: Secondary | ICD-10-CM | POA: Diagnosis not present

## 2022-02-11 DIAGNOSIS — D3132 Benign neoplasm of left choroid: Secondary | ICD-10-CM | POA: Diagnosis not present

## 2022-02-11 DIAGNOSIS — Z961 Presence of intraocular lens: Secondary | ICD-10-CM | POA: Diagnosis not present

## 2022-03-01 ENCOUNTER — Ambulatory Visit (INDEPENDENT_AMBULATORY_CARE_PROVIDER_SITE_OTHER): Payer: Medicare Other

## 2022-03-01 DIAGNOSIS — I480 Paroxysmal atrial fibrillation: Secondary | ICD-10-CM

## 2022-03-01 LAB — CUP PACEART REMOTE DEVICE CHECK
Date Time Interrogation Session: 20231204023726
Implantable Pulse Generator Implant Date: 20230629
Pulse Gen Serial Number: 511010609

## 2022-03-02 ENCOUNTER — Other Ambulatory Visit (HOSPITAL_COMMUNITY): Payer: Self-pay | Admitting: *Deleted

## 2022-03-02 MED ORDER — METOPROLOL TARTRATE 25 MG PO TABS: 25.0000 mg | ORAL_TABLET | Freq: Two times a day (BID) | ORAL | 0 refills | Status: DC

## 2022-03-03 ENCOUNTER — Other Ambulatory Visit: Payer: Self-pay | Admitting: Cardiovascular Disease

## 2022-03-03 DIAGNOSIS — L918 Other hypertrophic disorders of the skin: Secondary | ICD-10-CM | POA: Diagnosis not present

## 2022-03-03 DIAGNOSIS — L821 Other seborrheic keratosis: Secondary | ICD-10-CM | POA: Diagnosis not present

## 2022-03-03 DIAGNOSIS — L812 Freckles: Secondary | ICD-10-CM | POA: Diagnosis not present

## 2022-03-03 DIAGNOSIS — D225 Melanocytic nevi of trunk: Secondary | ICD-10-CM | POA: Diagnosis not present

## 2022-03-03 DIAGNOSIS — D1801 Hemangioma of skin and subcutaneous tissue: Secondary | ICD-10-CM | POA: Diagnosis not present

## 2022-03-10 ENCOUNTER — Telehealth: Payer: Self-pay | Admitting: Pharmacist

## 2022-03-10 NOTE — Telephone Encounter (Signed)
Noted - left message for pt that updated rx has been sent to his pharmacy so he can have them fill it in January when his copay goes back to normal. Can use up samples until then.

## 2022-03-10 NOTE — Telephone Encounter (Signed)
Patient completed end of treatment visit for [facility] AF trial on 03/04/22. Transitioned off study medication to Eliquis '5mg'$  BID. Has plenty of samples. Just making you aware so med list can be updated.

## 2022-04-01 ENCOUNTER — Ambulatory Visit (INDEPENDENT_AMBULATORY_CARE_PROVIDER_SITE_OTHER): Payer: Medicare Other

## 2022-04-01 DIAGNOSIS — I480 Paroxysmal atrial fibrillation: Secondary | ICD-10-CM | POA: Diagnosis not present

## 2022-04-01 LAB — CUP PACEART REMOTE DEVICE CHECK
Date Time Interrogation Session: 20240104020102
Implantable Pulse Generator Implant Date: 20230629
Pulse Gen Serial Number: 511010609

## 2022-04-08 NOTE — Progress Notes (Signed)
Merlin Loop Recorder  

## 2022-04-14 ENCOUNTER — Other Ambulatory Visit: Payer: Self-pay | Admitting: Cardiology

## 2022-04-16 ENCOUNTER — Encounter: Payer: Self-pay | Admitting: Cardiology

## 2022-04-16 ENCOUNTER — Other Ambulatory Visit: Payer: Self-pay

## 2022-04-16 MED ORDER — METOPROLOL TARTRATE 25 MG PO TABS: 25.0000 mg | ORAL_TABLET | Freq: Two times a day (BID) | ORAL | 2 refills | Status: DC

## 2022-04-16 MED ORDER — TELMISARTAN 80 MG PO TABS: 80.0000 mg | ORAL_TABLET | Freq: Every day | ORAL | 3 refills | Status: DC

## 2022-04-21 NOTE — Progress Notes (Signed)
Carelink Summary Report / Loop Recorder

## 2022-05-03 ENCOUNTER — Ambulatory Visit: Payer: Medicare Other

## 2022-05-03 DIAGNOSIS — I480 Paroxysmal atrial fibrillation: Secondary | ICD-10-CM

## 2022-05-04 LAB — CUP PACEART REMOTE DEVICE CHECK
Date Time Interrogation Session: 20240204091952
Implantable Pulse Generator Implant Date: 20230629
Pulse Gen Serial Number: 511010609

## 2022-06-03 ENCOUNTER — Ambulatory Visit: Payer: Medicare Other

## 2022-06-03 DIAGNOSIS — I480 Paroxysmal atrial fibrillation: Secondary | ICD-10-CM | POA: Diagnosis not present

## 2022-06-03 LAB — CUP PACEART REMOTE DEVICE CHECK
Date Time Interrogation Session: 20240307020227
Implantable Pulse Generator Implant Date: 20230629
Pulse Gen Serial Number: 511010609

## 2022-06-15 NOTE — Progress Notes (Unsigned)
Electrophysiology Office Follow up Visit Note:    Date:  06/16/2022   ID:  Wayne Roberson, DOB [birth year], MRN [id number]  PCP:  [name], MD  [facility] Cardiologist:  [name], MD  [facility] Electrophysiologist:  Vickie Epley, MD    Interval History:    Wayne Roberson is a 77 y.o. male who presents for a follow up visit.   He last saw Jonni Sanger 12/25/2021. He had an ablation 05/2021 for his AF. After his ablation he has experienced brief episodes of AF. He takes eliquis for stroke ppx.  His atrial fibrillation has been predominantly at night.  He is unsure whether not he snores at night.  He has not had a sleep study in the past.  He has considered amulet implant in the past.  He has discussed this with Dr. Wendi Maya at [facility] clinic.  He tells me that he intermittently will have blood in the tissue paper when he blows his nose.  He has nuisance bruising.  He has not had any life-threatening bleeding.     Past medical, surgical, social and family history were reviewed.  ROS:   Please see the history of present illness.    All other systems reviewed and are negative.    Physical Exam:    VS:  BP (!) 140/84   Pulse 64   Ht 5\' 10"  (1.778 m)   Wt 194 lb 9.6 oz (88.3 kg)   SpO2 98%   BMI 27.92 kg/m     Wt Readings from Last 3 Encounters:  06/16/22 194 lb 9.6 oz (88.3 kg)  12/25/21 202 lb (91.6 kg)  09/24/21 184 lb (83.5 kg)     GEN:  Well nourished, well developed in no acute distress CARDIAC: RRR, no murmurs, rubs, gallops RESPIRATORY:  Clear to auscultation without rales, wheezing or rhonchi       ASSESSMENT:    1. Paroxysmal atrial fibrillation (HCC)   2. Primary hypertension    PLAN:    In order of problems listed above:   #pAF Doing well after his 06/19/2021 ablation.  He did well without recurrence of arrhythmia for about 3 months and then experienced atrial fibrillation.  He was restarted on his anticoagulant.  He is interested in  better rhythm control in an effort to avoid long-term exposure anticoagulation. On eliquis for stroke ppx.  Discussed treatment options today for their AF including antiarrhythmic drug therapy and ablation. Discussed risks, recovery and likelihood of success. Discussed potential need for repeat ablation procedures and antiarrhythmic drugs after an initial ablation. They wish to proceed with scheduling.  Risk, benefits, and alternatives to EP study and radiofrequency ablation for afib were also discussed in detail today. These risks include but are not limited to stroke, bleeding, vascular damage, tamponade, perforation, damage to the esophagus, lungs, and other structures, pulmonary vein stenosis, worsening renal function, and death. The patient understands these risk and wishes to proceed.  We will therefore proceed with catheter ablation at the next available time.  Carto, ICE, anesthesia are requested for the procedure.  Will also obtain CT PV protocol prior to the procedure to exclude LAA thrombus and further evaluate atrial anatomy.  We also discussed alternative left atrial appendage occlusion devices during today's clinic appointment.  We discussed the risk profiles of the available left atrial appendage occlusion procedures, specifically the comparison between amulet and watchman.  #Hypertension At goal today.  Recommend checking blood pressures 1-2 times per week at home and recording  the values.  Recommend bringing these recordings to the primary care physician.    Signed, Lars Mage, MD, [facility], [facility] 06/16/2022 3:40 PM    Electrophysiology [facility]

## 2022-06-16 ENCOUNTER — Ambulatory Visit: Payer: Medicare Other | Attending: Cardiology | Admitting: Cardiology

## 2022-06-16 ENCOUNTER — Encounter: Payer: Self-pay | Admitting: Cardiology

## 2022-06-16 VITALS — BP 140/84 | HR 64 | Ht 70.0 in | Wt 194.6 lb

## 2022-06-16 DIAGNOSIS — I10 Essential (primary) hypertension: Secondary | ICD-10-CM | POA: Insufficient documentation

## 2022-06-16 DIAGNOSIS — I480 Paroxysmal atrial fibrillation: Secondary | ICD-10-CM | POA: Insufficient documentation

## 2022-06-16 NOTE — Progress Notes (Signed)
Carelink Summary Report / Loop Recorder 

## 2022-06-16 NOTE — Patient Instructions (Signed)
Medication Instructions:  Your physician recommends that you continue on your current medications as directed. Please refer to the Current Medication list given to you today. *If you need a refill on your cardiac medications before your next appointment, please call your pharmacy*   Lab Work: CBC and BMET prior to CT If you have labs (blood work) drawn today and your tests are completely normal, you will receive your results only by: [place] (if you have MyChart) OR A paper copy in the mail If you have any lab test that is abnormal or we need to change your treatment, we will call you to review the results.   Testing/Procedures: CT pulmonary vein Ablation  Your physician has recommended that you have an ablation. Catheter ablation is a medical procedure used to treat some cardiac arrhythmias (irregular heartbeats). During catheter ablation, a long, thin, flexible tube is put into a blood vessel in your groin (upper thigh), or neck. This tube is called an ablation catheter. It is then guided to your heart through the blood vessel. Radio frequency waves destroy small areas of heart tissue where abnormal heartbeats may cause an arrhythmia to start. Please see the instruction sheet given to you today.   Follow-Up: At [facility], you and your health needs are our priority.  As part of our continuing mission to provide you with exceptional heart care, we have created designated Provider Care Teams.  These Care Teams include your primary Cardiologist (physician) and Advanced Practice Providers (APPs -  Physician Assistants and Nurse Practitioners) who all work together to provide you with the care you need, when you need it.   Your next appointment:   We will contact you to schedule follow up   Provider:   Lars Mage, MD

## 2022-06-17 ENCOUNTER — Telehealth: Payer: Self-pay | Admitting: *Deleted

## 2022-06-17 NOTE — Telephone Encounter (Signed)
Prior Authorization for ITAMAR sent to MEDICARE via Phone. Reference # .  READY- NO PA REQ 

## 2022-06-18 NOTE — Telephone Encounter (Signed)
I s/w the pt and his wife about setting up Itamar study. Pt's wife states they may be able to come by the office this afternoon, if not they will call Monday to set up date and time. I said that will be fine and just let me know.

## 2022-06-21 NOTE — Telephone Encounter (Signed)
Patient Name:         DOB:       Height:     Weight:  Office Name:         Referring Provider:  Today's Date:  Date:   STOP BANG RISK ASSESSMENT S (snore) Have you been told that you snore?     NO   T (tired) Are you often tired, fatigued, or sleepy during the day?   YES  O (obstruction) Do you stop breathing, choke, or gasp during sleep? NO   P (pressure) Do you have or are you being treated for high blood pressure? YES   B (BMI) Is your body index greater than 35 kg/m? NO   A (age) Are you 18 years old or older? YES   N (neck) Do you have a neck circumference greater than 16 inches?   YES   G (gender) Are you a male? YES   TOTAL STOP/BANG "YES" ANSWERS 5                                                                        For Office Use Only              Procedure Order Form    YES to 3+ Stop Bang questions OR two clinical symptoms - patient qualifies for WatchPAT (CPT 95800)             Clinical Notes: Will consult Sleep Specialist and refer for management of therapy due to patient increased risk of Sleep Apnea. Ordering a sleep study due to the following two clinical symptoms: Excessive daytime sleepiness G47.10 / Gastroesophageal reflux K21.9 / Nocturia R35.1 / Morning Headaches G44.221 / Difficulty concentrating R41.840 / Memory problems or poor judgment G31.84 / Personality changes or irritability R45.4 / Loud snoring R06.83 / Depression F32.9 / Unrefreshed by sleep G47.8 / Impotence N52.9 / History of high blood pressure R03.0 / Insomnia G47.00    I understand that I am proceeding with a home sleep apnea test as ordered by my treating physician. I understand that untreated sleep apnea is a serious cardiovascular risk factor and it is my responsibility to perform the test and seek management for sleep apnea. I will be contacted with the results and be managed for sleep apnea by a local sleep physician. I will be receiving equipment and further instructions from  [facility] I shall promptly ship back the equipment via the included mailing label. I understand my insurance will be billed for the test and as the patient I am responsible for any insurance related out-of-pocket costs incurred. I have been provided with written instructions and can call for additional video or telephonic instruction, with 24-hour availability of qualified personnel to answer any questions: Patient Help Desk [phone].  Patient Signature ______________________________________________________   Date______________________ Patient Telemedicine Verbal Consent

## 2022-06-21 NOTE — Telephone Encounter (Signed)
Dr. Quentin Ore had ordered an Itamar sleep study. Pt has been approved and has been given the PIN#  [id number] before he left the office. Pt agreeable to signed waiver. Pt states he will do sleep study one night this week.   Called and made the patient aware that he may proceed with the [facility] Sleep Study. PIN # provided to the patient. Patient made aware that he will be contacted after the test has been read with the results and any recommendations. Patient verbalized understanding and thanked me for the call.

## 2022-06-21 NOTE — Telephone Encounter (Signed)
Wife returned CMA's call.

## 2022-06-21 NOTE — Telephone Encounter (Signed)
S/w the pt and his wife and they will come by the office to set up Itamar at 1:30 today. Looks like a stop bang will need to be done.

## 2022-06-22 ENCOUNTER — Encounter (INDEPENDENT_AMBULATORY_CARE_PROVIDER_SITE_OTHER): Payer: Medicare Other | Admitting: Cardiology

## 2022-06-22 DIAGNOSIS — I480 Paroxysmal atrial fibrillation: Secondary | ICD-10-CM

## 2022-06-22 DIAGNOSIS — R0683 Snoring: Secondary | ICD-10-CM | POA: Diagnosis not present

## 2022-06-23 ENCOUNTER — Ambulatory Visit: Payer: Medicare Other | Attending: Cardiology

## 2022-06-23 DIAGNOSIS — I10 Essential (primary) hypertension: Secondary | ICD-10-CM

## 2022-06-23 DIAGNOSIS — I480 Paroxysmal atrial fibrillation: Secondary | ICD-10-CM

## 2022-06-23 NOTE — Procedures (Signed)
   SLEEP STUDY REPORT Patient Information Study Date: 06/22/2022 Patient Name: Wayne Roberson Patient ID: [id number] Birth Date: April 10, 1945 Age: 77 Gender: Male BMI: 27.8 (W=194 lb, H=5' 10'') Stopbang: 5 Referring Physician: Lars Mage, MD  TEST DESCRIPTION: Home sleep apnea testing was completed using the WatchPat, a Type 1 device, utilizing peripheral arterial tonometry (PAT), chest movement, actigraphy, pulse oximetry, pulse rate, body position and snore. AHI was calculated with apnea and hypopnea using valid sleep time as the denominator. RDI includes apneas, hypopneas, and RERAs. The data acquired and the scoring of sleep and all associated events were performed in accordance with the recommended standards and specifications as outlined in the AASM Manual for the Scoring of Sleep and Associated Events 2.2.0 (2015).   FINDINGS: 1.  No evidence of Obstructive Sleep Apnea with AHI 4.4/hr.  2.  No Central Sleep Apnea. 3.  Oxygen desaturations as low as 86%. 4.  Mild to moderate snoring was present. O2 sats were < 88% for 1.2 minutes. 5.  Total sleep time was 8 hrs and 43 min. 6.  11.2% of total sleep time was spent in REM sleep.  7.  Normal sleep onset latency at 17 min.  8.  Shortened REM sleep onset latency at 53 min.  9.  Total awakenings were 12.   DIAGNOSIS:  Normal study with no significant sleep disordered breathing.  RECOMMENDATIONS:   1. Normal study with no significant sleep disordered breathing.  2.  Healthy sleep recommendations include:  adequate nightly sleep (normal 7-9 hrs/night), avoidance of caffeine after noon and alcohol near bedtime, and maintaining a sleep environment that is cool, dark and quiet.  3.  Weight loss for overweight patients is recommended.    4.  Snoring recommendations include:  weight loss where appropriate, side sleeping, and avoidance of alcohol before bed.  5.  Operation of motor vehicle or dangerous equipment must be avoided when  feeling drowsy, excessively sleepy, or mentally fatigued.    6.  An ENT consultation which may be useful for specific causes of and possible treatment of bothersome snoring.   7. Weight loss may be of benefit in reducing the severity of snoring.   Signature: Fransico Him, MD; [facility]; [place], [place] Board of Sleep Medicine Electronically Signed: 06/23/2022

## 2022-06-30 ENCOUNTER — Telehealth: Payer: Self-pay | Admitting: *Deleted

## 2022-06-30 ENCOUNTER — Other Ambulatory Visit: Payer: Self-pay | Admitting: Cardiology

## 2022-06-30 DIAGNOSIS — I480 Paroxysmal atrial fibrillation: Secondary | ICD-10-CM

## 2022-06-30 NOTE — Telephone Encounter (Signed)
Prescription refill request for Eliquis received. Indication: Afib  Last office visit: 06/16/22 Quentin Ore)  Scr: 0.85 (12/25/21)  Age: 77 Weight: 88.3kg  Appropriate dose. Refill sent.

## 2022-06-30 NOTE — Telephone Encounter (Signed)
-----   Message from Lauralee Evener, [place] sent at 06/23/2022 12:00 PM EDT -----  ----- Message ----- From: Sueanne Margarita, MD Sent: 06/23/2022  11:41 AM EDT To: [facility] Sleep Studies  Please let patient know that sleep study showed no significant sleep apnea.

## 2022-06-30 NOTE — Telephone Encounter (Signed)
The patient has been notified of the result. Left detailed message on voicemail and informed patient to call back..Laylia Mui Green, CMA   

## 2022-07-01 NOTE — Telephone Encounter (Signed)
The patient has been notified of the result and verbalized understanding.  All questions (if any) were answered. Marolyn Hammock, [place] 07/01/2022 4:25 PM    Pt is aware and agreeable to normal results.

## 2022-07-05 ENCOUNTER — Ambulatory Visit (INDEPENDENT_AMBULATORY_CARE_PROVIDER_SITE_OTHER): Payer: Medicare Other

## 2022-07-05 DIAGNOSIS — I480 Paroxysmal atrial fibrillation: Secondary | ICD-10-CM | POA: Diagnosis not present

## 2022-07-05 LAB — CUP PACEART REMOTE DEVICE CHECK
Date Time Interrogation Session: 20240408021944
Implantable Pulse Generator Implant Date: 20230629
Pulse Gen Serial Number: 511010609

## 2022-07-06 NOTE — Progress Notes (Signed)
Carelink Summary Report / Loop Recorder 

## 2022-07-09 ENCOUNTER — Encounter (HOSPITAL_COMMUNITY): Payer: Self-pay

## 2022-08-05 ENCOUNTER — Ambulatory Visit (INDEPENDENT_AMBULATORY_CARE_PROVIDER_SITE_OTHER): Payer: Medicare Other

## 2022-08-05 DIAGNOSIS — I480 Paroxysmal atrial fibrillation: Secondary | ICD-10-CM

## 2022-08-05 LAB — CUP PACEART REMOTE DEVICE CHECK
Date Time Interrogation Session: 20240509020132
Implantable Pulse Generator Implant Date: 20230629
Pulse Gen Serial Number: 511010609

## 2022-08-10 NOTE — Progress Notes (Signed)
Merlin Loop Recorder 

## 2022-08-12 DIAGNOSIS — H353111 Nonexudative age-related macular degeneration, right eye, early dry stage: Secondary | ICD-10-CM | POA: Diagnosis not present

## 2022-08-12 DIAGNOSIS — H35352 Cystoid macular degeneration, left eye: Secondary | ICD-10-CM | POA: Diagnosis not present

## 2022-08-12 DIAGNOSIS — Z961 Presence of intraocular lens: Secondary | ICD-10-CM | POA: Diagnosis not present

## 2022-08-12 DIAGNOSIS — H353221 Exudative age-related macular degeneration, left eye, with active choroidal neovascularization: Secondary | ICD-10-CM | POA: Diagnosis not present

## 2022-08-12 DIAGNOSIS — D3132 Benign neoplasm of left choroid: Secondary | ICD-10-CM | POA: Diagnosis not present

## 2022-08-16 DIAGNOSIS — H3581 Retinal edema: Secondary | ICD-10-CM | POA: Diagnosis not present

## 2022-08-16 DIAGNOSIS — H40013 Open angle with borderline findings, low risk, bilateral: Secondary | ICD-10-CM | POA: Diagnosis not present

## 2022-08-24 NOTE — Progress Notes (Signed)
Carelink Summary Report / Loop Recorder 

## 2022-09-07 ENCOUNTER — Ambulatory Visit: Payer: Medicare Other

## 2022-09-07 DIAGNOSIS — I480 Paroxysmal atrial fibrillation: Secondary | ICD-10-CM | POA: Diagnosis not present

## 2022-09-07 LAB — CUP PACEART REMOTE DEVICE CHECK
Date Time Interrogation Session: 20240609020136
Implantable Pulse Generator Implant Date: 20230629
Pulse Gen Serial Number: 511010609

## 2022-09-13 DIAGNOSIS — Z8546 Personal history of malignant neoplasm of prostate: Secondary | ICD-10-CM | POA: Diagnosis not present

## 2022-09-13 DIAGNOSIS — Z79899 Other long term (current) drug therapy: Secondary | ICD-10-CM | POA: Diagnosis not present

## 2022-09-13 DIAGNOSIS — Z86010 Personal history of colonic polyps: Secondary | ICD-10-CM | POA: Diagnosis not present

## 2022-09-13 DIAGNOSIS — Z0000 Encounter for general adult medical examination without abnormal findings: Secondary | ICD-10-CM | POA: Diagnosis not present

## 2022-09-13 DIAGNOSIS — Z1211 Encounter for screening for malignant neoplasm of colon: Secondary | ICD-10-CM | POA: Diagnosis not present

## 2022-09-13 DIAGNOSIS — I480 Paroxysmal atrial fibrillation: Secondary | ICD-10-CM | POA: Diagnosis not present

## 2022-09-13 DIAGNOSIS — Z136 Encounter for screening for cardiovascular disorders: Secondary | ICD-10-CM | POA: Diagnosis not present

## 2022-09-13 DIAGNOSIS — G4700 Insomnia, unspecified: Secondary | ICD-10-CM | POA: Diagnosis not present

## 2022-09-13 DIAGNOSIS — Z13220 Encounter for screening for lipoid disorders: Secondary | ICD-10-CM | POA: Diagnosis not present

## 2022-09-13 DIAGNOSIS — I10 Essential (primary) hypertension: Secondary | ICD-10-CM | POA: Diagnosis not present

## 2022-09-29 ENCOUNTER — Ambulatory Visit: Payer: Medicare Other | Attending: Internal Medicine

## 2022-09-29 DIAGNOSIS — I480 Paroxysmal atrial fibrillation: Secondary | ICD-10-CM | POA: Diagnosis not present

## 2022-09-29 DIAGNOSIS — I10 Essential (primary) hypertension: Secondary | ICD-10-CM

## 2022-09-29 LAB — CBC

## 2022-09-30 LAB — BASIC METABOLIC PANEL
BUN/Creatinine Ratio: 17 (ref 10–24)
BUN: 13 mg/dL (ref 8–27)
CO2: 22 mmol/L (ref 20–29)
Calcium: 9.1 mg/dL (ref 8.6–10.2)
Chloride: 101 mmol/L (ref 96–106)
Creatinine, Ser: 0.76 mg/dL (ref 0.76–1.27)
Glucose: 105 mg/dL — ABNORMAL HIGH (ref 70–99)
Potassium: 4.8 mmol/L (ref 3.5–5.2)
Sodium: 138 mmol/L (ref 134–144)
eGFR: 92 mL/min/{1.73_m2} (ref >59)

## 2022-09-30 LAB — CBC
Hemoglobin: 14.8 g/dL (ref 13.0–17.7)
MCH: 30.1 pg (ref 26.6–33.0)
MCHC: 33.6 g/dL (ref 31.5–35.7)
Platelets: 219 10*3/uL (ref 150–450)
RBC: 4.92 x10E6/uL (ref 4.14–5.80)
RDW: 13.7 % (ref 11.6–15.4)

## 2022-10-06 NOTE — Progress Notes (Signed)
Carelink Summary Report / Loop Recorder 

## 2022-10-07 ENCOUNTER — Telehealth (HOSPITAL_COMMUNITY): Payer: Self-pay | Admitting: *Deleted

## 2022-10-07 NOTE — Telephone Encounter (Signed)
Reaching out to patient to offer assistance regarding upcoming cardiac imaging study; pt verbalizes understanding of appt date/time, parking situation and where to check in, pre-test NPO status and verified current allergies; name and call back number provided for further questions should they arise  Hadlei Stitt RN Navigator Cardiac Imaging  Heart and Vascular [phone] office [phone] cell  Patient aware to arrive at 10:30am. 

## 2022-10-08 ENCOUNTER — Ambulatory Visit (HOSPITAL_COMMUNITY)
Admission: RE | Admit: 2022-10-08 | Discharge: 2022-10-08 | Disposition: A | Discharge status: Home / Self Care | Payer: Medicare Other | Source: Ambulatory Visit | Attending: Cardiology | Admitting: Cardiology

## 2022-10-08 DIAGNOSIS — I480 Paroxysmal atrial fibrillation: Secondary | ICD-10-CM | POA: Diagnosis not present

## 2022-10-08 DIAGNOSIS — I10 Essential (primary) hypertension: Secondary | ICD-10-CM | POA: Insufficient documentation

## 2022-10-08 MED ORDER — IOHEXOL 350 MG/ML SOLN
95.0000 mL | Freq: Once | INTRAVENOUS | Status: AC | PRN
  Administered 2022-10-08: 95 mL via INTRAVENOUS

## 2022-10-11 ENCOUNTER — Ambulatory Visit (INDEPENDENT_AMBULATORY_CARE_PROVIDER_SITE_OTHER): Payer: Medicare Other

## 2022-10-11 DIAGNOSIS — I480 Paroxysmal atrial fibrillation: Secondary | ICD-10-CM

## 2022-10-11 DIAGNOSIS — C61 Malignant neoplasm of prostate: Secondary | ICD-10-CM | POA: Diagnosis not present

## 2022-10-14 ENCOUNTER — Encounter: Payer: Self-pay | Admitting: Cardiology

## 2022-10-14 NOTE — Anesthesia Preprocedure Evaluation (Addendum)
Anesthesia Evaluation  Patient identified by MRN, date of birth, ID band Patient awake    Reviewed: Allergy & Precautions, NPO status , Patient's Chart, lab work & pertinent test results  Airway Mallampati: IV  TM Distance: <3 FB Neck ROM: Full    Dental  (+) Teeth Intact, Dental Advisory Given   Pulmonary COPD,  COPD inhaler, former smoker   Pulmonary exam normal breath sounds clear to auscultation       Cardiovascular hypertension, Pt. on medications and Pt. on home beta blockers (-) angina + DVT  (-) Past MI + dysrhythmias Atrial Fibrillation  Rhythm:Irregular Rate:Abnormal  '23 ECHO: EF 55-60, normal LVF, normal RVF, trivial MR   Neuro/Psych negative neurological ROS     GI/Hepatic negative GI ROS, Neg liver ROS,,,  Endo/Other  negative endocrine ROS    Renal/GU negative Renal ROS   Prostate cancer    Musculoskeletal  (+) Arthritis ,    Abdominal   Peds  Hematology  (+) Blood dyscrasia (Eliquis)   Anesthesia Other Findings   Reproductive/Obstetrics                             Anesthesia Physical Anesthesia Plan  ASA: 3  Anesthesia Plan: General   Post-op Pain Management: Tylenol PO (pre-op)*   Induction: Intravenous  PONV Risk Score and Plan: 2 and Ondansetron and Dexamethasone  Airway Management Planned: Oral ETT and Video Laryngoscope Planned  Additional Equipment: None  Intra-op Plan:   Post-operative Plan: Extubation in OR  Informed Consent: I have reviewed the patients History and Physical, chart, labs and discussed the procedure including the risks, benefits and alternatives for the proposed anesthesia with the patient or authorized representative who has indicated his/her understanding and acceptance.     Dental advisory given  Plan Discussed with: CRNA  Anesthesia Plan Comments:         Anesthesia Quick Evaluation

## 2022-10-14 NOTE — Pre-Procedure Instructions (Signed)
Attempted to call patient regarding procedure instructions.  Left voicemail on the following items: Arrival time 515 Nothing to eat or drink after midnight No meds AM of procedure Responsible person to drive you home and stay with you for 24 hrs  Have you missed any doses of anti-coagulant Eliquis- should be taken twice a day, if you have missed any doses please let office know right away.  Don't take dose in the morning.

## 2022-10-15 ENCOUNTER — Ambulatory Visit (HOSPITAL_COMMUNITY)
Admission: RE | Admit: 2022-10-15 | Discharge: 2022-10-15 | Disposition: A | Discharge status: Home / Self Care | Payer: Medicare Other | Attending: Cardiology | Admitting: Cardiology

## 2022-10-15 ENCOUNTER — Telehealth: Payer: Self-pay

## 2022-10-15 DIAGNOSIS — I480 Paroxysmal atrial fibrillation: Secondary | ICD-10-CM

## 2022-10-15 NOTE — Telephone Encounter (Signed)
Due to the outage pt's procedure has been moved from 7/19 to 10/22 at 7:30 AM.   Lab appt is 01/04/23 at [facility].  Updated Instruction letter will be sent via MyChart.   Pt said per Dr. Lalla Brothers he will not need to repeat his CT Scan.

## 2022-10-15 NOTE — Pre-Procedure Instructions (Signed)
Procedure cancelled today due to IT issues.  Office will call patient to rescheduled.  Patient aware.

## 2022-10-19 LAB — CUP PACEART REMOTE DEVICE CHECK
Date Time Interrogation Session: 20240710020100
Implantable Pulse Generator Implant Date: 20230629
Pulse Gen Serial Number: 511010609

## 2022-10-25 NOTE — Progress Notes (Signed)
Merlin Loop Recorder  

## 2022-10-26 DIAGNOSIS — C61 Malignant neoplasm of prostate: Secondary | ICD-10-CM | POA: Diagnosis not present

## 2022-11-12 ENCOUNTER — Ambulatory Visit (HOSPITAL_COMMUNITY): Payer: Medicare Other | Admitting: Internal Medicine

## 2022-11-15 ENCOUNTER — Ambulatory Visit (INDEPENDENT_AMBULATORY_CARE_PROVIDER_SITE_OTHER): Payer: Medicare Other

## 2022-11-15 DIAGNOSIS — I480 Paroxysmal atrial fibrillation: Secondary | ICD-10-CM | POA: Diagnosis not present

## 2022-11-15 LAB — CUP PACEART REMOTE DEVICE CHECK
Date Time Interrogation Session: 20240818020158
Implantable Pulse Generator Implant Date: 20230629
Pulse Gen Serial Number: 511010609

## 2022-11-24 DIAGNOSIS — Z7901 Long term (current) use of anticoagulants: Secondary | ICD-10-CM | POA: Diagnosis not present

## 2022-11-24 DIAGNOSIS — Z86010 Personal history of colonic polyps: Secondary | ICD-10-CM | POA: Diagnosis not present

## 2022-11-24 DIAGNOSIS — I4891 Unspecified atrial fibrillation: Secondary | ICD-10-CM | POA: Diagnosis not present

## 2022-11-25 NOTE — Progress Notes (Signed)
Merlin Loop Recorder  

## 2022-12-15 LAB — CUP PACEART REMOTE DEVICE CHECK
Date Time Interrogation Session: 20240918022500
Implantable Pulse Generator Implant Date: 20230629
Pulse Gen Serial Number: 511010609

## 2022-12-16 DIAGNOSIS — D3132 Benign neoplasm of left choroid: Secondary | ICD-10-CM | POA: Diagnosis not present

## 2022-12-16 DIAGNOSIS — H353111 Nonexudative age-related macular degeneration, right eye, early dry stage: Secondary | ICD-10-CM | POA: Diagnosis not present

## 2022-12-16 DIAGNOSIS — Z961 Presence of intraocular lens: Secondary | ICD-10-CM | POA: Diagnosis not present

## 2022-12-16 DIAGNOSIS — H35352 Cystoid macular degeneration, left eye: Secondary | ICD-10-CM | POA: Diagnosis not present

## 2022-12-16 DIAGNOSIS — H353221 Exudative age-related macular degeneration, left eye, with active choroidal neovascularization: Secondary | ICD-10-CM | POA: Diagnosis not present

## 2022-12-20 ENCOUNTER — Ambulatory Visit (INDEPENDENT_AMBULATORY_CARE_PROVIDER_SITE_OTHER): Payer: Medicare Other

## 2022-12-20 DIAGNOSIS — I480 Paroxysmal atrial fibrillation: Secondary | ICD-10-CM | POA: Diagnosis not present

## 2022-12-23 DIAGNOSIS — Z96651 Presence of right artificial knee joint: Secondary | ICD-10-CM | POA: Diagnosis not present

## 2022-12-23 DIAGNOSIS — M25562 Pain in left knee: Secondary | ICD-10-CM | POA: Diagnosis not present

## 2022-12-31 NOTE — Progress Notes (Signed)
Merlin Loop Recorder  

## 2023-01-04 ENCOUNTER — Ambulatory Visit: Payer: Medicare Other | Attending: Cardiology

## 2023-01-04 DIAGNOSIS — I480 Paroxysmal atrial fibrillation: Secondary | ICD-10-CM | POA: Diagnosis not present

## 2023-01-05 LAB — CBC
Hematocrit: 45.4 % (ref 37.5–51.0)
Hemoglobin: 14.9 g/dL (ref 13.0–17.7)
MCH: 29.8 pg (ref 26.6–33.0)
MCHC: 32.8 g/dL (ref 31.5–35.7)
MCV: 91 fL (ref 79–97)
Platelets: 175 10*3/uL (ref 150–450)
RBC: 5 x10E6/uL (ref 4.14–5.80)
RDW: 13.3 % (ref 11.6–15.4)
WBC: 7.4 10*3/uL (ref 3.4–10.8)

## 2023-01-05 LAB — BASIC METABOLIC PANEL
BUN/Creatinine Ratio: 15 (ref 10–24)
BUN: 12 mg/dL (ref 8–27)
CO2: 22 mmol/L (ref 20–29)
Calcium: 9.4 mg/dL (ref 8.6–10.2)
Chloride: 102 mmol/L (ref 96–106)
Creatinine, Ser: 0.81 mg/dL (ref 0.76–1.27)
Glucose: 111 mg/dL — ABNORMAL HIGH (ref 70–99)
Potassium: 4.6 mmol/L (ref 3.5–5.2)
Sodium: 141 mmol/L (ref 134–144)
eGFR: 90 mL/min/{1.73_m2} (ref >59)

## 2023-01-17 NOTE — Pre-Procedure Instructions (Signed)
Instructed patient on the following items: Arrival time 0515 Nothing to eat or drink after midnight No meds AM of procedure Responsible person to drive you home and stay with you for 24 hrs  Have you missed any doses of anti-coagulant Eliquis- takes twice a day, hasn't missed any doses.  Don't take dose in the morning.

## 2023-01-18 ENCOUNTER — Other Ambulatory Visit: Payer: Self-pay

## 2023-01-18 ENCOUNTER — Ambulatory Visit (HOSPITAL_BASED_OUTPATIENT_CLINIC_OR_DEPARTMENT_OTHER): Payer: Medicare Other | Admitting: Anesthesiology

## 2023-01-18 ENCOUNTER — Ambulatory Visit (HOSPITAL_COMMUNITY)
Admission: RE | Admit: 2023-01-18 | Discharge: 2023-01-18 | Disposition: A | Discharge status: Home / Self Care | Payer: Medicare Other | Attending: Cardiology | Admitting: Cardiology

## 2023-01-18 ENCOUNTER — Ambulatory Visit (HOSPITAL_COMMUNITY): Payer: Medicare Other | Admitting: Anesthesiology

## 2023-01-18 ENCOUNTER — Ambulatory Visit (HOSPITAL_COMMUNITY)
Admission: RE | Disposition: A | Discharge status: Home / Self Care | Payer: Self-pay | Source: Home / Self Care | Attending: Cardiology

## 2023-01-18 ENCOUNTER — Other Ambulatory Visit (HOSPITAL_COMMUNITY): Payer: Self-pay

## 2023-01-18 DIAGNOSIS — I480 Paroxysmal atrial fibrillation: Secondary | ICD-10-CM | POA: Diagnosis not present

## 2023-01-18 DIAGNOSIS — I4891 Unspecified atrial fibrillation: Secondary | ICD-10-CM | POA: Diagnosis not present

## 2023-01-18 DIAGNOSIS — I10 Essential (primary) hypertension: Secondary | ICD-10-CM | POA: Insufficient documentation

## 2023-01-18 DIAGNOSIS — J449 Chronic obstructive pulmonary disease, unspecified: Secondary | ICD-10-CM | POA: Insufficient documentation

## 2023-01-18 DIAGNOSIS — I483 Typical atrial flutter: Secondary | ICD-10-CM | POA: Diagnosis not present

## 2023-01-18 DIAGNOSIS — Z7901 Long term (current) use of anticoagulants: Secondary | ICD-10-CM | POA: Insufficient documentation

## 2023-01-18 DIAGNOSIS — Z8546 Personal history of malignant neoplasm of prostate: Secondary | ICD-10-CM | POA: Insufficient documentation

## 2023-01-18 DIAGNOSIS — Z86718 Personal history of other venous thrombosis and embolism: Secondary | ICD-10-CM | POA: Insufficient documentation

## 2023-01-18 HISTORY — PX: ATRIAL FIBRILLATION ABLATION: EP1191

## 2023-01-18 SURGERY — ATRIAL FIBRILLATION ABLATION
Anesthesia: General

## 2023-01-18 MED ORDER — ATROPINE SULFATE 1 MG/10ML IJ SOSY
PREFILLED_SYRINGE | INTRAMUSCULAR | Status: DC | PRN
  Administered 2023-01-18: 1 mg via INTRAVENOUS

## 2023-01-18 MED ORDER — COLCHICINE 0.6 MG PO TABS
0.6000 mg | ORAL_TABLET | Freq: Two times a day (BID) | ORAL | 0 refills | Status: DC
  Filled 2023-01-18: qty 10, 5d supply, fill #0

## 2023-01-18 MED ORDER — HEPARIN SODIUM (PORCINE) 1000 UNIT/ML IJ SOLN
INTRAMUSCULAR | Status: DC | PRN
  Administered 2023-01-18: 5000 [IU] via INTRAVENOUS
  Administered 2023-01-18: 13000 [IU] via INTRAVENOUS

## 2023-01-18 MED ORDER — SODIUM CHLORIDE 0.9 % IV SOLN: INTRAVENOUS | Status: DC

## 2023-01-18 MED ORDER — PROPOFOL 10 MG/ML IV BOLUS
INTRAVENOUS | Status: DC | PRN
  Administered 2023-01-18: 140 mg via INTRAVENOUS
  Administered 2023-01-18: 40 mg via INTRAVENOUS
  Administered 2023-01-18: 125 ug/kg/min via INTRAVENOUS

## 2023-01-18 MED ORDER — LIDOCAINE 2% (20 MG/ML) 5 ML SYRINGE
INTRAMUSCULAR | Status: DC | PRN
  Administered 2023-01-18: 80 mg via INTRAVENOUS

## 2023-01-18 MED ORDER — FENTANYL CITRATE (PF) 250 MCG/5ML IJ SOLN
INTRAMUSCULAR | Status: DC | PRN
  Administered 2023-01-18: 100 ug via INTRAVENOUS

## 2023-01-18 MED ORDER — SODIUM CHLORIDE 0.9% FLUSH: 3.0000 mL | Freq: Two times a day (BID) | INTRAVENOUS | Status: DC

## 2023-01-18 MED ORDER — APIXABAN 5 MG PO TABS
5.0000 mg | ORAL_TABLET | Freq: Two times a day (BID) | ORAL | Status: DC
  Administered 2023-01-18: 5 mg via ORAL
  Filled 2023-01-18 (×2): qty 1

## 2023-01-18 MED ORDER — ATROPINE SULFATE 1 MG/10ML IJ SOSY
PREFILLED_SYRINGE | INTRAMUSCULAR | Status: AC
  Filled 2023-01-18: qty 10

## 2023-01-18 MED ORDER — ROCURONIUM BROMIDE 10 MG/ML (PF) SYRINGE
PREFILLED_SYRINGE | INTRAVENOUS | Status: DC | PRN
  Administered 2023-01-18: 20 mg via INTRAVENOUS
  Administered 2023-01-18: 60 mg via INTRAVENOUS
  Administered 2023-01-18: 20 mg via INTRAVENOUS

## 2023-01-18 MED ORDER — ONDANSETRON HCL 4 MG/2ML IJ SOLN: 4.0000 mg | Freq: Four times a day (QID) | INTRAMUSCULAR | Status: DC | PRN

## 2023-01-18 MED ORDER — HEPARIN (PORCINE) IN NACL 1000-0.9 UT/500ML-% IV SOLN
INTRAVENOUS | Status: DC | PRN
  Administered 2023-01-18 (×2): 500 mL

## 2023-01-18 MED ORDER — PHENYLEPHRINE HCL-NACL 20-0.9 MG/250ML-% IV SOLN
INTRAVENOUS | Status: DC | PRN
  Administered 2023-01-18: 30 ug/min via INTRAVENOUS

## 2023-01-18 MED ORDER — HEPARIN SODIUM (PORCINE) 1000 UNIT/ML IJ SOLN
INTRAMUSCULAR | Status: DC | PRN
  Administered 2023-01-18: 1000 [IU] via INTRAVENOUS

## 2023-01-18 MED ORDER — ACETAMINOPHEN 500 MG PO TABS
1000.0000 mg | ORAL_TABLET | Freq: Once | ORAL | Status: AC
  Administered 2023-01-18: 1000 mg via ORAL
  Filled 2023-01-18: qty 2

## 2023-01-18 MED ORDER — SUGAMMADEX SODIUM 200 MG/2ML IV SOLN
INTRAVENOUS | Status: DC | PRN
  Administered 2023-01-18: 200 mg via INTRAVENOUS

## 2023-01-18 MED ORDER — FENTANYL CITRATE (PF) 100 MCG/2ML IJ SOLN
INTRAMUSCULAR | Status: AC
  Filled 2023-01-18: qty 2

## 2023-01-18 MED ORDER — ACETAMINOPHEN 325 MG PO TABS
ORAL_TABLET | ORAL | Status: AC
  Filled 2023-01-18: qty 2

## 2023-01-18 MED ORDER — PANTOPRAZOLE SODIUM 40 MG PO TBEC
40.0000 mg | DELAYED_RELEASE_TABLET | Freq: Every day | ORAL | 0 refills | Status: DC
  Filled 2023-01-18: qty 45, 45d supply, fill #0

## 2023-01-18 MED ORDER — SODIUM CHLORIDE 0.9% FLUSH: 3.0000 mL | INTRAVENOUS | Status: DC | PRN

## 2023-01-18 MED ORDER — PROTAMINE SULFATE 10 MG/ML IV SOLN
INTRAVENOUS | Status: DC | PRN
  Administered 2023-01-18: 30 mg via INTRAVENOUS

## 2023-01-18 MED ORDER — DEXAMETHASONE SODIUM PHOSPHATE 10 MG/ML IJ SOLN
INTRAMUSCULAR | Status: DC | PRN
  Administered 2023-01-18: 10 mg via INTRAVENOUS

## 2023-01-18 MED ORDER — HEPARIN SODIUM (PORCINE) 1000 UNIT/ML IJ SOLN
INTRAMUSCULAR | Status: AC
  Filled 2023-01-18: qty 10

## 2023-01-18 MED ORDER — PANTOPRAZOLE SODIUM 40 MG PO TBEC
40.0000 mg | DELAYED_RELEASE_TABLET | Freq: Every day | ORAL | Status: DC
  Administered 2023-01-18: 40 mg via ORAL
  Filled 2023-01-18 (×2): qty 1

## 2023-01-18 MED ORDER — COLCHICINE 0.6 MG PO TABS
0.6000 mg | ORAL_TABLET | Freq: Two times a day (BID) | ORAL | Status: DC
  Administered 2023-01-18: 0.6 mg via ORAL
  Filled 2023-01-18 (×2): qty 1

## 2023-01-18 MED ORDER — ONDANSETRON HCL 4 MG/2ML IJ SOLN
INTRAMUSCULAR | Status: DC | PRN
  Administered 2023-01-18: 4 mg via INTRAVENOUS

## 2023-01-18 MED ORDER — ACETAMINOPHEN 325 MG PO TABS
650.0000 mg | ORAL_TABLET | ORAL | Status: DC | PRN
  Administered 2023-01-18: 650 mg via ORAL

## 2023-01-18 SURGICAL SUPPLY — 23 items
BAG SNAP BAND KOVER 36X36 (MISCELLANEOUS) IMPLANT
BLANKET WARM UNDERBOD FULL ACC (MISCELLANEOUS) ×1 IMPLANT
CABLE PFA RX CATH CONN (CABLE) IMPLANT
CATH FARAWAVE ABLATION 31 (CATHETERS) IMPLANT
CATH OCTARAY 2.0 F 3-3-3-3-3 (CATHETERS) IMPLANT
CATH SMTCH THERMOCOOL SF DF (CATHETERS) IMPLANT
CATH SOUNDSTAR ECO 8FR (CATHETERS) IMPLANT
CATH WEBSTER BI DIR CS D-F CRV (CATHETERS) IMPLANT
CLOSURE PERCLOSE PROSTYLE (VASCULAR PRODUCTS) IMPLANT
COVER SWIFTLINK CONNECTOR (BAG) ×1 IMPLANT
DILATOR VESSEL 38 20CM 16FR (INTRODUCER) IMPLANT
GUIDEWIRE INQWIRE 1.5J.035X260 (WIRE) IMPLANT
INQWIRE 1.5J .035X260CM (WIRE) ×1
PACK EP LATEX FREE (CUSTOM PROCEDURE TRAY) ×1
PACK EP LF (CUSTOM PROCEDURE TRAY) ×1 IMPLANT
PAD DEFIB RADIO PHYSIO CONN (PAD) ×1 IMPLANT
PATCH CARTO3 (PAD) IMPLANT
SHEATH FARADRIVE STEERABLE (SHEATH) IMPLANT
SHEATH PINNACLE 8F 10CM (SHEATH) IMPLANT
SHEATH PINNACLE 9F 10CM (SHEATH) IMPLANT
SHEATH PROBE COVER 6X72 (BAG) IMPLANT
SHEATH WIRE KIT BAYLIS SL1 (KITS) IMPLANT
TUBING SMART ABLATE COOLFLOW (TUBING) IMPLANT

## 2023-01-18 NOTE — Anesthesia Procedure Notes (Signed)
Procedure Name: Intubation Date/Time: 01/18/2023 7:42 AM  Performed by: Kayleen Memos, CRNAPre-anesthesia Checklist: Patient identified, Emergency Drugs available, Suction available and Patient being monitored Patient Re-evaluated:Patient Re-evaluated prior to induction Oxygen Delivery Method: Circle System Utilized Preoxygenation: Pre-oxygenation with 100% oxygen Induction Type: IV induction Ventilation: Mask ventilation without difficulty Laryngoscope Size: Glidescope and 4 Grade View: Grade I Tube type: Oral Tube size: 7.5 mm Number of attempts: 1 Airway Equipment and Method: Rigid stylet and Video-laryngoscopy Placement Confirmation: ETT inserted through vocal cords under direct vision, positive ETCO2 and breath sounds checked- equal and bilateral Secured at: 23 (at the lip) cm Tube secured with: Tape Dental Injury: Teeth and Oropharynx as per pre-operative assessment

## 2023-01-18 NOTE — Transfer of Care (Signed)
Immediate Anesthesia Transfer of Care Note  Patient: Wayne Roberson  Procedure(s) Performed: ATRIAL FIBRILLATION ABLATION  Patient Location: PACU and Cath Lab  Anesthesia Type:General  Level of Consciousness: awake, oriented, and drowsy  Airway & Oxygen Therapy: Patient Spontanous Breathing and Patient connected to nasal cannula oxygen  Post-op Assessment: Report given to RN, Post -op Vital signs reviewed and stable, and Patient moving all extremities  Post vital signs: Reviewed and stable  Last Vitals:  Vitals Value Taken Time  BP    Temp    Pulse    Resp    SpO2      Last Pain:  Vitals:   01/18/23 0542  TempSrc: Oral  PainSc: 2          Complications: There were no known notable events for this encounter.

## 2023-01-18 NOTE — Anesthesia Postprocedure Evaluation (Signed)
Anesthesia Post Note  Patient: Wayne Roberson  Procedure(s) Performed: ATRIAL FIBRILLATION ABLATION     Patient location during evaluation: Cath Lab Anesthesia Type: General Level of consciousness: awake and alert Pain management: pain level controlled Vital Signs Assessment: post-procedure vital signs reviewed and stable Respiratory status: spontaneous breathing, nonlabored ventilation, respiratory function stable and patient connected to nasal cannula oxygen Cardiovascular status: blood pressure returned to baseline and stable Postop Assessment: no apparent nausea or vomiting Anesthetic complications: no   There were no known notable events for this encounter.  Last Vitals:  Vitals:   01/18/23 0542 01/18/23 1015  BP: (!) 153/83 120/80  Pulse: 71 60  Resp:  12  Temp: 36.8 C   SpO2: 96% 95%    Last Pain:  Vitals:   01/18/23 1015  TempSrc:   PainSc: 0-No pain                 Collene Schlichter

## 2023-01-18 NOTE — H&P (Addendum)
Electrophysiology Office Follow up Visit Note:     Date:  01/18/2023    ID:  Wayne Roberson, DOB Nov 22, 1945, MRN [id number]   PCP:  Kirby Funk, MD           [facility] Cardiologist:  Donato Schultz, MD  [facility] Electrophysiologist:  Lanier Prude, MD      Interval History:     Wayne Roberson is a 77 y.o. male who presents for a follow up visit.    He last saw Wayne Roberson 12/25/2021. He had an ablation 05/2021 for his AF. After his ablation he has experienced brief episodes of AF. He takes eliquis for stroke ppx.   His atrial fibrillation has been predominantly at night.  He is unsure whether not he snores at night.  He has not had a sleep study in the past.   He has considered amulet implant in the past.  He has discussed this with Dr. Otelia Limes at [facility] clinic.   He tells me that he intermittently will have blood in the tissue paper when he blows his nose.  He has nuisance bruising.  He has not had any life-threatening bleeding.   Presents for PVI today. Procedure reviewed. He has taken his OAC without missed doses.   Objective Past medical, surgical, social and family history were reviewed.   ROS:   Please see the history of present illness.    All other systems reviewed and are negative.       Physical Exam:     VS:  BP 153/83   Pulse 71   Ht 5\' 10"  (1.778 m)   Wt 194 lb 9.6 oz (88.3 kg)   SpO2 98%   BMI 27.92 kg/m         Wt Readings from Last 3 Encounters:  06/16/22 194 lb 9.6 oz (88.3 kg)  12/25/21 202 lb (91.6 kg)  09/24/21 184 lb (83.5 kg)      GEN:  Well nourished, well developed in no acute distress CARDIAC: RRR, no murmurs, rubs, gallops RESPIRATORY:  Clear to auscultation without rales, wheezing or rhonchi          Assessment ASSESSMENT:     1. Paroxysmal atrial fibrillation (HCC)   2. Primary hypertension     PLAN:     In order of problems listed above:     #pAF Doing well after his 06/19/2021 ablation.  He did well without  recurrence of arrhythmia for about 3 months and then experienced atrial fibrillation.  He was restarted on his anticoagulant.  He is interested in better rhythm control in an effort to avoid long-term exposure anticoagulation. On eliquis for stroke ppx.   Discussed treatment options today for their AF including antiarrhythmic drug therapy and ablation. Discussed risks, recovery and likelihood of success. Discussed potential need for repeat ablation procedures and antiarrhythmic drugs after an initial ablation. They wish to proceed with scheduling.   Risk, benefits, and alternatives to EP study and radiofrequency ablation for afib were also discussed in detail today. These risks include but are not limited to stroke, bleeding, vascular damage, tamponade, perforation, damage to the esophagus, lungs, and other structures, pulmonary vein stenosis, worsening renal function, and death. The patient understands these risk and wishes to proceed.  We will therefore proceed with catheter ablation at the next available time.  Carto, ICE, anesthesia are requested for the procedure.  Will also obtain CT PV protocol prior to the procedure to exclude LAA thrombus and further evaluate atrial  anatomy.   We also discussed alternative left atrial appendage occlusion devices during today's clinic appointment.  We discussed the risk profiles of the available left atrial appendage occlusion procedures, specifically the comparison between amulet and watchman.   #Hypertension At goal today.  Recommend checking blood pressures 1-2 times per week at home and recording the values.  Recommend bringing these recordings to the primary care physician.    Presents for redo PVI today. Procedure reviewed.   Signed, Steffanie Dunn, MD, [facility] 01/18/2023 Electrophysiology [facility]

## 2023-01-18 NOTE — Discharge Instructions (Signed)

## 2023-01-19 ENCOUNTER — Other Ambulatory Visit (HOSPITAL_COMMUNITY): Payer: Self-pay

## 2023-01-19 ENCOUNTER — Encounter (HOSPITAL_COMMUNITY): Payer: Self-pay | Admitting: Cardiology

## 2023-01-19 MED FILL — Fentanyl Citrate Preservative Free (PF) Inj 100 MCG/2ML: INTRAMUSCULAR | Qty: 2 | Status: AC

## 2023-01-20 LAB — POCT ACTIVATED CLOTTING TIME: Activated Clotting Time: 269 s

## 2023-01-24 ENCOUNTER — Ambulatory Visit: Payer: Medicare Other

## 2023-01-24 DIAGNOSIS — I480 Paroxysmal atrial fibrillation: Secondary | ICD-10-CM

## 2023-01-26 LAB — CUP PACEART REMOTE DEVICE CHECK
Date Time Interrogation Session: 20241028020605
Implantable Pulse Generator Implant Date: 20230629
Pulse Gen Serial Number: 511010609

## 2023-02-03 ENCOUNTER — Other Ambulatory Visit: Payer: Self-pay | Admitting: Student

## 2023-02-07 DIAGNOSIS — Z23 Encounter for immunization: Secondary | ICD-10-CM | POA: Diagnosis not present

## 2023-02-10 NOTE — Progress Notes (Signed)
Merlin Loop Recorder  

## 2023-02-15 ENCOUNTER — Ambulatory Visit (HOSPITAL_COMMUNITY): Payer: Medicare Other | Admitting: Physician Assistant

## 2023-02-21 DIAGNOSIS — H40013 Open angle with borderline findings, low risk, bilateral: Secondary | ICD-10-CM | POA: Diagnosis not present

## 2023-02-21 DIAGNOSIS — H3581 Retinal edema: Secondary | ICD-10-CM | POA: Diagnosis not present

## 2023-02-21 DIAGNOSIS — H04123 Dry eye syndrome of bilateral lacrimal glands: Secondary | ICD-10-CM | POA: Diagnosis not present

## 2023-02-21 DIAGNOSIS — H26491 Other secondary cataract, right eye: Secondary | ICD-10-CM | POA: Diagnosis not present

## 2023-02-21 DIAGNOSIS — H35033 Hypertensive retinopathy, bilateral: Secondary | ICD-10-CM | POA: Diagnosis not present

## 2023-02-22 ENCOUNTER — Ambulatory Visit (HOSPITAL_COMMUNITY): Payer: Medicare Other | Admitting: Physician Assistant

## 2023-02-22 ENCOUNTER — Encounter (HOSPITAL_COMMUNITY): Payer: Self-pay

## 2023-02-25 ENCOUNTER — Ambulatory Visit (INDEPENDENT_AMBULATORY_CARE_PROVIDER_SITE_OTHER): Payer: Medicare Other

## 2023-02-25 DIAGNOSIS — I480 Paroxysmal atrial fibrillation: Secondary | ICD-10-CM | POA: Diagnosis not present

## 2023-02-26 DIAGNOSIS — J069 Acute upper respiratory infection, unspecified: Secondary | ICD-10-CM | POA: Diagnosis not present

## 2023-02-26 DIAGNOSIS — B309 Viral conjunctivitis, unspecified: Secondary | ICD-10-CM | POA: Diagnosis not present

## 2023-02-26 LAB — CUP PACEART REMOTE DEVICE CHECK
Date Time Interrogation Session: 20241128020258
Implantable Pulse Generator Implant Date: 20230629
Pulse Gen Serial Number: 511010609

## 2023-03-02 ENCOUNTER — Encounter: Payer: Self-pay | Admitting: Cardiology

## 2023-03-02 NOTE — Telephone Encounter (Signed)
Spoke with the patient who states that he has been dealing with the flu for the past two weeks. He has had several nose bleeds recently. He had one earlier today that he had trouble stopping. Encouraged patient to use nasal spray and keep his nares moist. If states he does have some nasal spray and has been using Vaseline as well. He is aware to let us know if nose bleeds continue and/or worsen.

## 2023-03-07 DIAGNOSIS — R509 Fever, unspecified: Secondary | ICD-10-CM | POA: Diagnosis not present

## 2023-03-07 DIAGNOSIS — R040 Epistaxis: Secondary | ICD-10-CM | POA: Diagnosis not present

## 2023-03-07 DIAGNOSIS — Z03818 Encounter for observation for suspected exposure to other biological agents ruled out: Secondary | ICD-10-CM | POA: Diagnosis not present

## 2023-03-08 DIAGNOSIS — R509 Fever, unspecified: Secondary | ICD-10-CM | POA: Diagnosis not present

## 2023-03-14 IMAGING — CT CT HEART MORPH/PULM VEIN W/ CM & W/O CA SCORE
1 of 5 series · 8 of 20 positions shown, 10 images · IV contrast (APPLIED)
Comparison: Cardiac CT from February 24, 2018.

Addendum:
CLINICAL DATA: Atrial fibrillation scheduled for left atrial
appendage closure.

EXAM:
Cardiac CT/CTA
TECHNIQUE: A non-contrast, gated CT scan was obtained with axial slices of 3 mm
through the heart for calcium scoring. Calcium scoring was performed
using the Agatston method. A 100 kV prospective, gated, contrast
cardiac scan was obtained. Gantry rotation speed was 250 msecs and
collimation was 0.6 mm. Nitroglycerin was not given. A delayed scan
was obtained to exclude left atrial appendage thrombus. The 3D
dataset was reconstructed in 5% intervals of the 25-50% of the R-R
cycle. Late systolic phases were analyzed on a dedicated workstation
using MPR, MIP, and VRT modes. The patient received 80 cc of
contrast.

[Series 6: best syst · axial · 0.39mm/px · z∈[+1010,+1134]mm · 8 of 401 slices shown, 10 images]
[im 45/401  vessel]
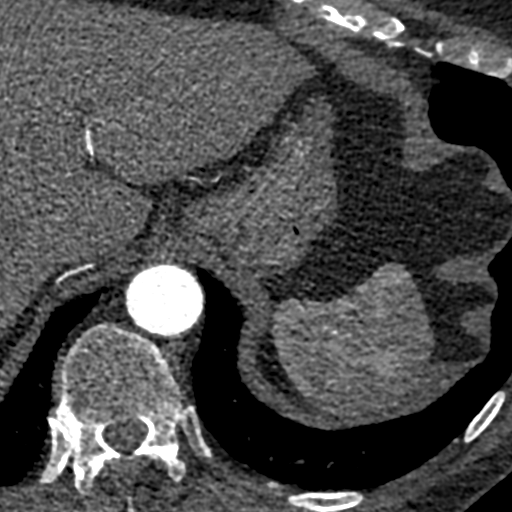
[im 45/401  lung]
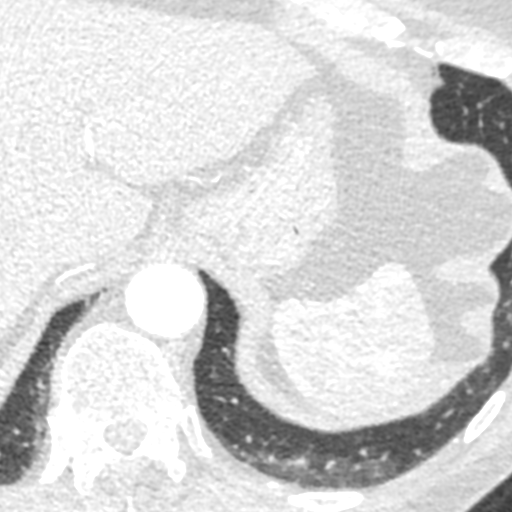
[im 89/401  vessel]
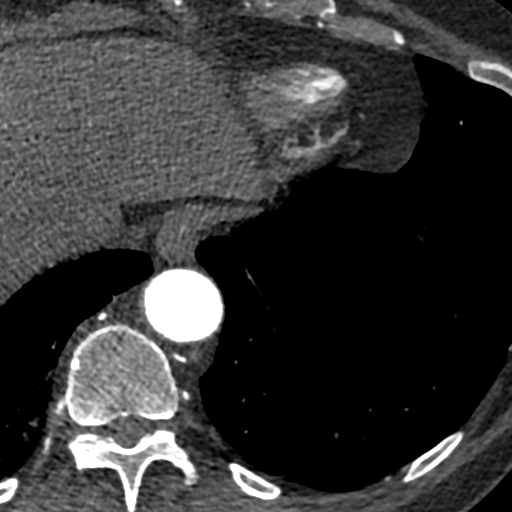
[im 134/401  vessel]
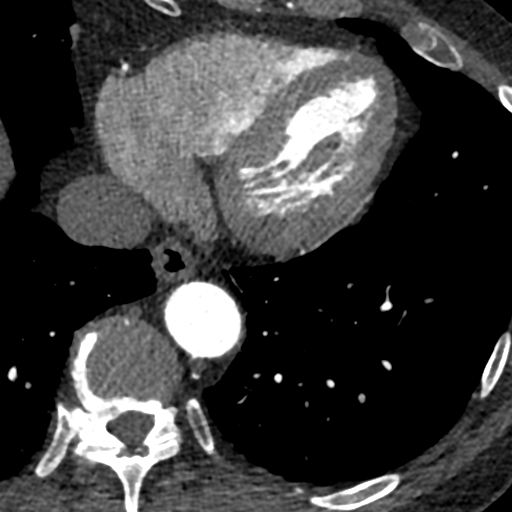
[im 178/401  vessel]
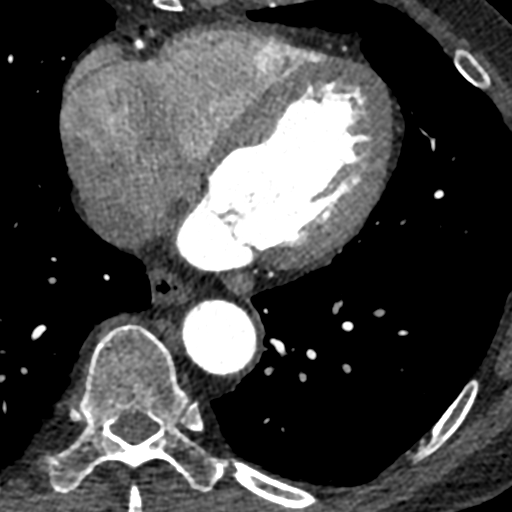
[im 223/401  vessel]
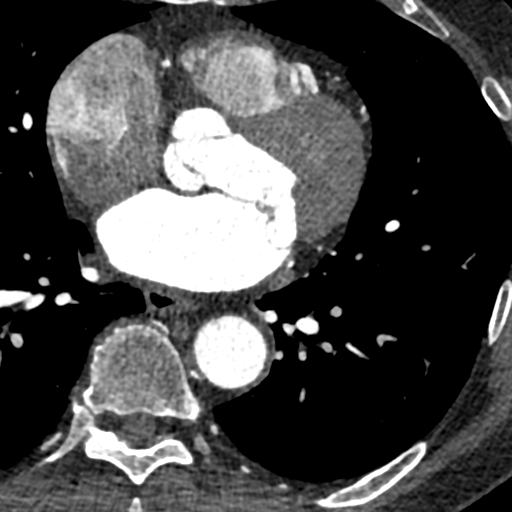
[im 223/401  lung]
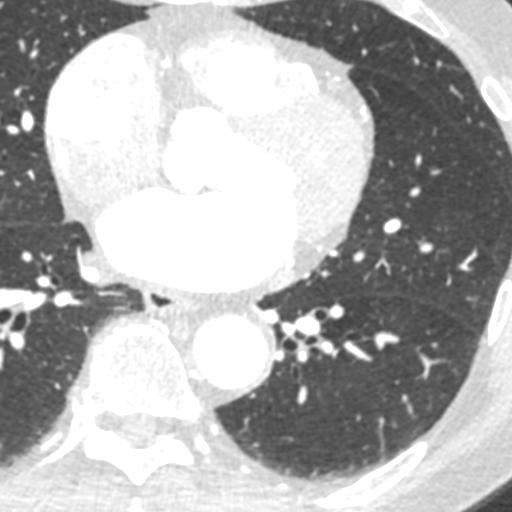
[im 267/401  vessel]
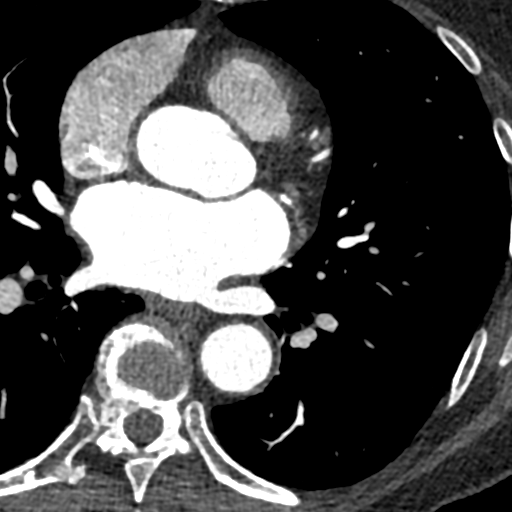
[im 312/401  vessel]
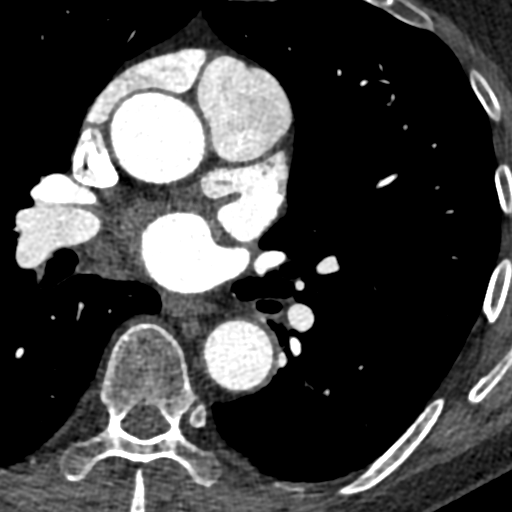
[im 356/401  vessel]
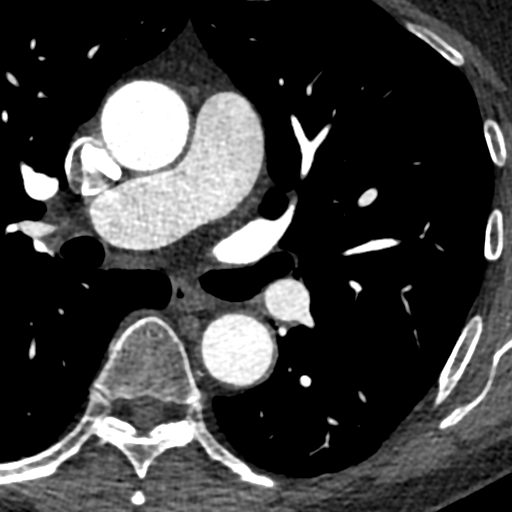

[8 of 20 positions shown; findings below may reference images not displayed]

FINDINGS: Image quality: excellent.

Noise artifact is: Limited.

Left Atrium: The left atrial size is dilated. There is no PFO/ASD.
There is normal pulmonary vein drainage into the left atrium (2 on
the right and 2 on the left).

Left Atrial Appendage:

Morphology: The left atrial appendage is large chicken wing type
with two lobes.
Thrombus: There is no thrombus in the left atrial appendage on
contrast or delayed imaging.

The following measurements were made regarding left atrial appendage
closure:

Phase assessed: 35%

Landing Zone measurement: 27.9 mm x 15.4 mm (oval shaped). Average
diameter 20.6 mm.

LANDIN Length (maximum): 19.3 mm

Optimal interatrial septum puncture site: Mid and posterior.

Optimal deployment angle: RAO 32 TIGER 14

Catheter: A double curve catheter is recommended.

Klpigbb Moolman Device: A 24 mm device is recommended with 14%
compression.

Other comments: None.

Coronary Arteries: CAC score of 0.6, which is 7th percentile for
age-, sex-, and race-matched controls. Normal coronary origin. Right
dominance. The study was performed without use of NTG and is
insufficient for plaque evaluation.

Right Atrium: Right atrial size is dilated.

Right Ventricle: The right ventricular cavity is within normal
limits.

Left Ventricle: The ventricular cavity size is within normal limits.
There are no stigmata of prior infarction. There is no abnormal
filling defect.

Pulmonary Artery: Normal caliber without proximal filling defect.

Cardiac valves: The aortic valve is trileaflet without significant
calcification. The mitral valve is myxomatous with posterior leaflet
prolapse.

Aorta: Mildly dilated aortic root up to 40 mm.

Pericardium: Normal thickness with no significant effusion or
calcium present.

Extra-cardiac findings: See attached radiology report for
non-cardiac structures.
IMPRESSION: 1. The left atrial appendage is a large chicken wing morphology
without thrombus.

2. Normal pulmonary vein anatomy.

3. A 24 mm Klpigbb Moolman device is recommended based on the above
landing zone measurements (oval shaped, 20.6 mm average diameter;
14% compression).

4. A mid posterior MARNA puncture site is recommended.

5. Optimal deployment angle: RAO 32 TIGER 14

6. Normal coronary origin. Right dominance. CAC score of 0.6, which
is 7th percentile for age-, sex-, and race-matched controls.

EXAM:
OVER-READ INTERPRETATION  CT CHEST

The following report is an over-read performed by radiologist Dr.
does not include interpretation of cardiac or coronary anatomy or
pathology. The coronary calcium score and coronary CT angiography
interpretation by the cardiologist is attached.
FINDINGS: Cardiovascular: See dedicated report for cardiac details.

Mediastinum/Nodes: Imaged portions of the mediastinum without signs
of adenopathy or acute process.

Lungs/Pleura: Minimal basilar atelectasis. Visible airways are
patent.

Upper Abdomen: No acute upper abdominal findings to the extent
evaluated, very limited assessment.

Musculoskeletal: No acute bone finding. No destructive bone process.
Spinal degenerative changes.
IMPRESSION: No acute or significant extracardiac findings. Imaging with CT is
confined to the mid chest and cardiac structures.

*** End of Addendum ***
FINDINGS: Image quality: excellent.

Noise artifact is: Limited.

Left Atrium: The left atrial size is dilated. There is no PFO/ASD.
There is normal pulmonary vein drainage into the left atrium (2 on
the right and 2 on the left).

Left Atrial Appendage:

Morphology: The left atrial appendage is large chicken wing type
with two lobes.
Thrombus: There is no thrombus in the left atrial appendage on
contrast or delayed imaging.

The following measurements were made regarding left atrial appendage
closure:

Phase assessed: 35%

Landing Zone measurement: 27.9 mm x 15.4 mm (oval shaped). Average
diameter 20.6 mm.

LANDIN Length (maximum): 19.3 mm

Optimal interatrial septum puncture site: Mid and posterior.

Optimal deployment angle: RAO 32 TIGER 14

Catheter: A double curve catheter is recommended.

Klpigbb Moolman Device: A 24 mm device is recommended with 14%
compression.

Other comments: None.

Coronary Arteries: CAC score of 0.6, which is 7th percentile for
age-, sex-, and race-matched controls. Normal coronary origin. Right
dominance. The study was performed without use of NTG and is
insufficient for plaque evaluation.

Right Atrium: Right atrial size is dilated.

Right Ventricle: The right ventricular cavity is within normal
limits.

Left Ventricle: The ventricular cavity size is within normal limits.
There are no stigmata of prior infarction. There is no abnormal
filling defect.

Pulmonary Artery: Normal caliber without proximal filling defect.

Cardiac valves: The aortic valve is trileaflet without significant
calcification. The mitral valve is myxomatous with posterior leaflet
prolapse.

Aorta: Mildly dilated aortic root up to 40 mm.

Pericardium: Normal thickness with no significant effusion or
calcium present.

Extra-cardiac findings: See attached radiology report for
non-cardiac structures.
IMPRESSION: 1. The left atrial appendage is a large chicken wing morphology
without thrombus.

2. Normal pulmonary vein anatomy.

3. A 24 mm Klpigbb Moolman device is recommended based on the above
landing zone measurements (oval shaped, 20.6 mm average diameter;
14% compression).

4. A mid posterior MARNA puncture site is recommended.

5. Optimal deployment angle: RAO 32 TIGER 14

6. Normal coronary origin. Right dominance. CAC score of 0.6, which
is 7th percentile for age-, sex-, and race-matched controls.

## 2023-03-15 ENCOUNTER — Encounter: Payer: Medicare Other | Admitting: Cardiology

## 2023-03-21 NOTE — Progress Notes (Signed)
Carelink Summary Report / Loop Recorder 

## 2023-03-21 NOTE — Addendum Note (Signed)
Addended by: Elease Etienne A on: 03/21/2023 08:36 AM   Modules accepted: Orders

## 2023-03-24 ENCOUNTER — Other Ambulatory Visit: Payer: Self-pay | Admitting: Cardiovascular Disease

## 2023-03-25 DIAGNOSIS — R059 Cough, unspecified: Secondary | ICD-10-CM | POA: Diagnosis not present

## 2023-03-28 ENCOUNTER — Ambulatory Visit: Payer: Medicare Other

## 2023-03-28 DIAGNOSIS — D649 Anemia, unspecified: Secondary | ICD-10-CM | POA: Diagnosis not present

## 2023-03-28 DIAGNOSIS — R040 Epistaxis: Secondary | ICD-10-CM | POA: Diagnosis not present

## 2023-03-28 DIAGNOSIS — J342 Deviated nasal septum: Secondary | ICD-10-CM | POA: Diagnosis not present

## 2023-03-28 DIAGNOSIS — I480 Paroxysmal atrial fibrillation: Secondary | ICD-10-CM

## 2023-03-30 ENCOUNTER — Encounter: Payer: Self-pay | Admitting: Cardiology

## 2023-03-31 LAB — CUP PACEART REMOTE DEVICE CHECK
Date Time Interrogation Session: 20241231180020
Implantable Pulse Generator Implant Date: 20230629
Pulse Gen Serial Number: 511010609

## 2023-04-20 ENCOUNTER — Encounter: Payer: Self-pay | Admitting: Student

## 2023-04-20 ENCOUNTER — Ambulatory Visit: Payer: Medicare Other | Attending: Student | Admitting: Student

## 2023-04-20 VITALS — BP 126/78 | HR 56 | Ht 70.0 in | Wt 194.6 lb

## 2023-04-20 DIAGNOSIS — I10 Essential (primary) hypertension: Secondary | ICD-10-CM | POA: Insufficient documentation

## 2023-04-20 DIAGNOSIS — I480 Paroxysmal atrial fibrillation: Secondary | ICD-10-CM | POA: Insufficient documentation

## 2023-04-20 NOTE — Patient Instructions (Signed)
Medication Instructions:  Your physician recommends that you continue on your current medications as directed. Please refer to the Current Medication list given to you today.  *If you need a refill on your cardiac medications before your next appointment, please call your pharmacy*  Lab Work: None ordered If you have labs (blood work) drawn today and your tests are completely normal, you will receive your results only by: MyChart Message (if you have MyChart) OR A paper copy in the mail If you have any lab test that is abnormal or we need to change your treatment, we will call you to review the results.  Follow-Up: At [facility], you and your health needs are our priority.  As part of our continuing mission to provide you with exceptional heart care, we have created designated Provider Care Teams.  These Care Teams include your primary Cardiologist (physician) and Advanced Practice Providers (APPs -  Physician Assistants and Nurse Practitioners) who all work together to provide you with the care you need, when you need it.  Your next appointment:   4 month(s)  Provider:   Steffanie Dunn, MD only

## 2023-04-20 NOTE — Progress Notes (Signed)
   Electrophysiology Office Note:   Date:  04/20/2023  ID:  Wayne Roberson, DOB Aug 23, 1945, MRN [id number]  Primary Cardiologist: Donato Schultz, MD Electrophysiologist: Lanier Prude, MD      History of Present Illness:   Wayne Roberson is a 78 y.o. male with h/o AF with 2 ablations and loop recorder seen today for routine electrophysiology followup.   Since last being seen in our clinic the patient reports doing well. Overall,  he denies chest pain, palpitations, dyspnea, PND, orthopnea, nausea, vomiting, dizziness, syncope, edema, weight gain, or early satiety.  Has not noted any breakthrough AF. Hoping to get off Eliquis with loop monitoring if he can go six months without AF.   Review of systems complete and found to be negative unless listed in HPI.   Studies Reviewed:    EKG is ordered today. Personal review as below.  EKG Interpretation Date/Time:  Wednesday April 20 2023 11:57:15 EST Ventricular Rate:  56 PR Interval:  170 QRS Duration:  94 QT Interval:  410 QTC Calculation: 395 R Axis:   8  Text Interpretation: Sinus bradycardia When compared with ECG of 18-Jan-2023 09:36, No significant change was found Confirmed by Maxine Glenn [id number]) on 04/20/2023 12:05:45 PM    Arryhtmia/Device History: Abbott Loop 08/2021 S/p AF ablation 05/2021 Redo ablation 12/2022   Physical Exam:   VS:  BP 126/78   Pulse (!) 56   Ht 5\' 10"  (1.778 m)   Wt 194 lb 9.6 oz (88.3 kg)   SpO2 96%   BMI 27.92 kg/m    Wt Readings from Last 3 Encounters:  04/20/23 194 lb 9.6 oz (88.3 kg)  01/18/23 195 lb (88.5 kg)  06/16/22 194 lb 9.6 oz (88.3 kg)     GEN: No acute distress NECK: No JVD; No carotid bruits CARDIAC: Regular rate and rhythm, no murmurs, rubs, gallops RESPIRATORY:  Clear to auscultation without rales, wheezing or rhonchi  ABDOMEN: Soft, non-tender, non-distended EXTREMITIES:  No edema; No deformity   ILR Interrogation- reviewed in detail today,  See PACEART  report  ASSESSMENT AND PLAN:    Atrial fibrillation s/p Abbott Loop recorder S/p 05/2021 and 12/2022 Up to date with remotes and no AF.  Continue eliquis 5 mg BID for now with CHA2DS2/VASc of at least 4 Wants to have shared decision discussing about coming off with loop recorder monitoring at 6 month mark.    Follow up with Dr. Lalla Brothers in 4 months for 6 months post ablation discussion regarding OAC.   Signed, Graciella Freer, PA-C

## 2023-04-27 ENCOUNTER — Ambulatory Visit (INDEPENDENT_AMBULATORY_CARE_PROVIDER_SITE_OTHER): Payer: Medicare Other

## 2023-04-27 DIAGNOSIS — I480 Paroxysmal atrial fibrillation: Secondary | ICD-10-CM

## 2023-04-28 LAB — CUP PACEART REMOTE DEVICE CHECK
Date Time Interrogation Session: 20250129020458
Implantable Pulse Generator Implant Date: 20230629
Pulse Gen Serial Number: 511010609

## 2023-05-01 ENCOUNTER — Encounter: Payer: Self-pay | Admitting: Cardiology

## 2023-05-20 ENCOUNTER — Other Ambulatory Visit: Payer: Self-pay

## 2023-05-20 DIAGNOSIS — I480 Paroxysmal atrial fibrillation: Secondary | ICD-10-CM

## 2023-05-20 MED ORDER — APIXABAN 5 MG PO TABS: 5.0000 mg | ORAL_TABLET | Freq: Two times a day (BID) | ORAL | 3 refills | Status: DC

## 2023-05-20 NOTE — Telephone Encounter (Signed)
Prescription refill request for Eliquis received. Indication:afib Last office visit:1/25 Scr:0.81  10/24 Age: 78 Weight:88.3  kg  Prescription refilled

## 2023-06-01 ENCOUNTER — Telehealth: Admitting: Hematology and Oncology

## 2023-06-02 ENCOUNTER — Ambulatory Visit: Payer: Medicare Other

## 2023-06-02 DIAGNOSIS — I480 Paroxysmal atrial fibrillation: Secondary | ICD-10-CM | POA: Diagnosis not present

## 2023-06-04 ENCOUNTER — Encounter: Payer: Self-pay | Admitting: Cardiology

## 2023-06-04 LAB — CUP PACEART REMOTE DEVICE CHECK
Date Time Interrogation Session: 20250307040027
Implantable Pulse Generator Implant Date: 20230629
Pulse Gen Model: 5000
Pulse Gen Serial Number: 511010609

## 2023-06-07 NOTE — Addendum Note (Signed)
 Addended by: Elease Etienne A on: 06/07/2023 08:54 AM   Modules accepted: Orders

## 2023-06-07 NOTE — Progress Notes (Signed)
 Carelink Summary Report / Loop Recorder

## 2023-06-09 ENCOUNTER — Encounter: Payer: Self-pay | Admitting: Cardiology

## 2023-06-24 ENCOUNTER — Telehealth: Payer: Self-pay

## 2023-06-24 NOTE — Telephone Encounter (Signed)
   Pre-operative Risk Assessment    Patient Name: Wayne Roberson  DOB: [birth year] MRN: [id number]   Date of last office visit: 04/20/23 A. Lanna Poche, [place] Date of next office visit: 08/26/23 Lalla Brothers, MD   Request for Surgical Clearance    Procedure:   Colonoscopy  Date of Surgery:  Clearance TBD                                 Surgeon:  Dr. Vida Rigger Surgeon's Group or Practice Name:  [facility] Phone number:  [phone] Fax number:  [phone]   Type of Clearance Requested:   - Medical  - Pharmacy:  Hold Apixaban (Eliquis) 1-2 days prior    Type of Anesthesia:   Propofal   Additional requests/questions:    Elyse Jarvis   06/24/2023, 2:22 PM

## 2023-06-27 NOTE — Telephone Encounter (Signed)
 Patient with diagnosis of afib on Eliquis for anticoagulation.    Procedure: colonoscopy Date of procedure: TBD   CHA2DS2-VASc Score = 3   This indicates a 3.2% annual risk of stroke. The patient's score is based upon: CHF History: 0 HTN History: 1 Diabetes History: 0 Stroke History: 0 Vascular Disease History: 0 Age Score: 2 Gender Score: 0      CrCl 82 ml/min Platelet count 175  Per office protocol, patient can hold Eliquis for 2 days prior to procedure.    **This guidance is not considered finalized until pre-operative APP has relayed final recommendations.**

## 2023-06-28 ENCOUNTER — Telehealth: Payer: Self-pay | Admitting: *Deleted

## 2023-06-28 ENCOUNTER — Encounter: Payer: Self-pay | Admitting: Cardiology

## 2023-06-28 NOTE — Telephone Encounter (Signed)
   Name: Wayne Roberson  DOB: 12-22-1945  MRN: [id number]  Primary Cardiologist: Donato Schultz, MD  Chart reviewed as part of pre-operative protocol coverage. Because of [name] past medical history and time since last visit, he will require a follow-up telephone visit in order to better assess preoperative cardiovascular risk.  Pre-op covering staff: - Please schedule appointment and call patient to inform them. If patient already had an upcoming appointment within acceptable timeframe, please add "pre-op clearance" to the appointment notes so provider is aware. - Please contact requesting surgeon's office via preferred method (i.e, phone, fax) to inform them of need for appointment prior to surgery.  Per office protocol, can hold Eliquis x 2 days prior to procedure.  Please resume a medically safe to do so.  Sharlene Dory, PA-C  06/28/2023, 7:57 AM

## 2023-06-28 NOTE — Telephone Encounter (Signed)
 See notes from earlier today, pt states he is going to wait until he see's Dr. Lalla Brothers in May for preop clearance.   I will forward to preop APP as FYI.      Patient Calls Doctor, hospital First) View All Conversations on this Encounter You1 hour ago (3:36 PM)    Sharlene Dory, PA-C  [place], [place] Replies will be sent to P Cv Div Preop Callback Can we set this patient up with a tele towards the end of the month/early May please?  Thanks! Sharlene Dory, PA-C        Previous Messages    ----- Message ----- From: Cherylann Banas, RN Sent: 06/28/2023  11:06 AM EDT To: [facility] Preop Subject: FW: Clearance for colonoscopy                   ----- Message ----- From: Gae Gallop Sent: 06/28/2023  11:02 AM EDT To: Cv Div Ch St Triage Subject: Clearance for colonoscopy                      I had a phone call about giving clearance for my colonoscopy.  I mistakenly told the lady that I didn't think I needed a phone clearance but wait until my May 30th appointment with you.  My wife, who talked to the Dr. Marlane Hatcher scheduler said I need to do the clearance so they can schedule me in May or June.      Note   You routed conversation to Cv Div Preop Callback1 hour ago (3:35 PM)   You1 hour ago (3:35 PM)    Pt needs tele pre op appt; see notes

## 2023-06-28 NOTE — Telephone Encounter (Signed)
 Sharlene Dory, PA-C  [place], [place] Replies will be sent to P Cv Div Preop Callback Can we set this patient up with a tele towards the end of the month/early May please?  Thanks! Sharlene Dory, PA-C       Previous Messages    ----- Message ----- From: Cherylann Banas, RN Sent: 06/28/2023  11:06 AM EDT To: [facility] Preop Subject: FW: Clearance for colonoscopy                   ----- Message ----- From: Gae Gallop Sent: 06/28/2023  11:02 AM EDT To: Cv Div Ch St Triage Subject: Clearance for colonoscopy                      I had a phone call about giving clearance for my colonoscopy.  I mistakenly told the lady that I didn't think I needed a phone clearance but wait until my May 30th appointment with you.  My wife, who talked to the Dr. Marlane Hatcher scheduler said I need to do the clearance so they can schedule me in May or June.

## 2023-06-28 NOTE — Telephone Encounter (Signed)
 Pt needs tele pre op appt; see notes

## 2023-06-28 NOTE — Telephone Encounter (Signed)
 Patient would like to do clearance on visit with Dr. Lalla Brothers in May , added to visit notes

## 2023-07-04 ENCOUNTER — Ambulatory Visit (INDEPENDENT_AMBULATORY_CARE_PROVIDER_SITE_OTHER)

## 2023-07-04 DIAGNOSIS — I480 Paroxysmal atrial fibrillation: Secondary | ICD-10-CM | POA: Diagnosis not present

## 2023-07-05 LAB — CUP PACEART REMOTE DEVICE CHECK
Date Time Interrogation Session: 20250407080315
Implantable Pulse Generator Implant Date: 20230629
Pulse Gen Model: 5000
Pulse Gen Serial Number: 511010609

## 2023-07-08 NOTE — Progress Notes (Signed)
 Carelink Summary Report / Loop Recorder

## 2023-07-08 NOTE — Addendum Note (Signed)
 Addended by: Elease Etienne A on: 07/08/2023 10:52 AM   Modules accepted: Orders

## 2023-07-09 ENCOUNTER — Encounter: Payer: Self-pay | Admitting: Cardiology

## 2023-08-02 ENCOUNTER — Other Ambulatory Visit: Payer: Self-pay | Admitting: Cardiology

## 2023-08-04 ENCOUNTER — Ambulatory Visit (INDEPENDENT_AMBULATORY_CARE_PROVIDER_SITE_OTHER)

## 2023-08-04 DIAGNOSIS — I480 Paroxysmal atrial fibrillation: Secondary | ICD-10-CM

## 2023-08-04 LAB — CUP PACEART REMOTE DEVICE CHECK
Date Time Interrogation Session: 20250508090346
Implantable Pulse Generator Implant Date: 20230629
Pulse Gen Model: 5000
Pulse Gen Serial Number: 511010609

## 2023-08-07 ENCOUNTER — Encounter: Payer: Self-pay | Admitting: Cardiology

## 2023-08-16 NOTE — Addendum Note (Signed)
 Addended by: Edra Govern D on: 08/16/2023 09:40 AM   Modules accepted: Orders

## 2023-08-16 NOTE — Progress Notes (Signed)
 [facility] [address] [employer]

## 2023-08-25 NOTE — Progress Notes (Unsigned)
 Electrophysiology Office Follow up Visit Note:    Date:  08/26/2023   ID:  Wayne Roberson, DOB Apr 29, 1945, MRN [id number]  PCP:  Benedetta Bradley, MD  [facility] Cardiologist:  Dorothye Gathers, MD  [facility] Electrophysiologist:  Boyce Byes, MD    Interval History:     Wayne Roberson is a 78 y.o. male who presents for a follow up visit.   The patient last saw Jonelle Neri April 20, 2023.  He has a history of atrial fibrillation with 2 prior catheter ablations.  At the last appointment with Jonelle Neri he reported no interval atrial fibrillation and was interested in avoiding long-term exposure to anticoagulation.  He has been doing well.  He is with his wife today in clinic who I previously met.  His heart rhythm has remained stable.  He is very eager to stop Eliquis .  He has previously tolerated aspirin 81 mg by mouth once daily.  He does describe some shortness of breath and increased mucus production since his flu infection at the end of last year that was complicated by a bacterial superinfection.  He is just not fully bounced back after that.       Past medical, surgical, social and family history were reviewed.  ROS:   Please see the history of present illness.    All other systems reviewed and are negative.  EKGs/Labs/Other Studies Reviewed:    The following studies were reviewed today:  Loop recorder interrogation showed no atrial fibrillation following catheter ablation        Physical Exam:    VS:  BP 124/80 (BP Location: Left Arm, Patient Position: Sitting, Cuff Size: Large)   Pulse 78   Ht 5\' 10"  (1.778 m)   Wt 189 lb (85.7 kg)   SpO2 95%   BMI 27.12 kg/m     Wt Readings from Last 3 Encounters:  08/26/23 189 lb (85.7 kg)  04/20/23 194 lb 9.6 oz (88.3 kg)  01/18/23 195 lb (88.5 kg)     GEN: no distress CARD: RRR, No MRG RESP: No IWOB. CTAB.      ASSESSMENT:    1. Paroxysmal atrial fibrillation (HCC)   2. Primary hypertension     PLAN:    In order of problems listed above:  #Atrial fibrillation No recurrence after his most recent ablation on January 18, 2023.  Has a loop recorder in place.  He has been on Eliquis  for stroke prophylaxis.  The patient is very interested in avoiding long-term exposure to anticoagulation.  He is interested in stopping the anticoagulation and monitoring for atrial fibrillation recurrence using his loop recorder.  We discussed the data (limited) supporting an approach such as this.  He understands that he is at an increased risk of stroke with this strategy but would like to employ this strategy to minimize his risk of bleeding on indefinite anticoagulation.  As he stops Eliquis , I will have him start aspirin 81 mg by mouth once daily.  #Cough/shortness of breath/mucus production 2 view chest x-ray ordered today.  He will follow-up with his primary care physician as scheduled next month  #Hypertension At goal today.  Recommend checking blood pressures 1-2 times per week at home and recording the values.  Recommend bringing these recordings to the primary care physician. Continue telmisartan   #Preop risk stratification The patient is at acceptable risk to undergo planned colonoscopy.  Okay to hold aspirin for 5 days prior to the colonoscopy and restart when felt safe from  a post op bleeding perspective.  I would like him to have the 2 view chest x-ray prior to colonoscopy.  Follow-up 1 year with me.    Signed, Harvie Liner, MD, [facility], [facility] 08/26/2023 10:05 AM    Electrophysiology [facility]

## 2023-08-26 ENCOUNTER — Ambulatory Visit (INDEPENDENT_AMBULATORY_CARE_PROVIDER_SITE_OTHER): Payer: Medicare Other | Admitting: Cardiology

## 2023-08-26 ENCOUNTER — Ambulatory Visit (HOSPITAL_COMMUNITY)
Admission: RE | Admit: 2023-08-26 | Discharge: 2023-08-26 | Disposition: A | Discharge status: Home / Self Care | Source: Ambulatory Visit | Attending: Cardiology | Admitting: Cardiology

## 2023-08-26 ENCOUNTER — Encounter: Payer: Self-pay | Admitting: Cardiology

## 2023-08-26 VITALS — BP 124/80 | HR 78 | Ht 70.0 in | Wt 189.0 lb

## 2023-08-26 DIAGNOSIS — I480 Paroxysmal atrial fibrillation: Secondary | ICD-10-CM | POA: Insufficient documentation

## 2023-08-26 DIAGNOSIS — I10 Essential (primary) hypertension: Secondary | ICD-10-CM

## 2023-08-26 MED ORDER — ASPIRIN 81 MG PO TBEC: 81.0000 mg | DELAYED_RELEASE_TABLET | Freq: Every day | ORAL | Status: DC

## 2023-08-26 NOTE — Patient Instructions (Signed)
 Medication Instructions:  Your physician has recommended you make the following change in your medication:  1) STOP taking Eliquis  2) START taking Aspirin 81 mg daily  *If you need a refill on your cardiac medications before your next appointment, please call your pharmacy*  Testing/Procedures: Echocardiogram  Your physician has requested that you have an echocardiogram. Echocardiography is a painless test that uses sound waves to create images of your heart. It provides your doctor with information about the size and shape of your heart and how well your heart's chambers and valves are working. This procedure takes approximately one hour. There are no restrictions for this procedure. Please do NOT wear cologne, perfume, aftershave, or lotions (deodorant is allowed). Please arrive 15 minutes prior to your appointment time.  Please note: We ask at that you not bring children with you during ultrasound (echo/ vascular) testing. Due to room size and safety concerns, children are not allowed in the ultrasound rooms during exams. Our front office staff cannot provide observation of children in our lobby area while testing is being conducted. An adult accompanying a patient to their appointment will only be allowed in the ultrasound room at the discretion of the ultrasound technician under special circumstances. We apologize for any inconvenience.  Chest X-Ray  A chest x-ray takes a picture of the organs and structures inside the chest, including the heart, lungs, and blood vessels. This test can show several things, including, whether the heart is enlarges; whether fluid is building up in the lungs; and whether pacemaker / defibrillator leads are still in place.  Follow-Up: At [facility], you and your health needs are our priority.  As part of our continuing mission to provide you with exceptional heart care, our providers are all part of one team.  This team includes your primary  Cardiologist (physician) and Advanced Practice Providers or APPs (Physician Assistants and Nurse Practitioners) who all work together to provide you with the care you need, when you need it.  Your next appointment:   1 year  Provider:   Harvie Liner, MD

## 2023-09-03 ENCOUNTER — Ambulatory Visit: Payer: Self-pay | Admitting: Cardiology

## 2023-09-05 ENCOUNTER — Ambulatory Visit: Payer: Self-pay | Admitting: Cardiology

## 2023-09-05 ENCOUNTER — Ambulatory Visit (INDEPENDENT_AMBULATORY_CARE_PROVIDER_SITE_OTHER)

## 2023-09-05 DIAGNOSIS — I480 Paroxysmal atrial fibrillation: Secondary | ICD-10-CM | POA: Diagnosis not present

## 2023-09-05 LAB — CUP PACEART REMOTE DEVICE CHECK
Date Time Interrogation Session: 20250608054338
Implantable Pulse Generator Implant Date: 20230629
Pulse Gen Model: 5000
Pulse Gen Serial Number: 511010609

## 2023-09-09 NOTE — Progress Notes (Signed)
 Carelink Summary Report / Loop Recorder

## 2023-10-06 ENCOUNTER — Ambulatory Visit

## 2023-10-06 DIAGNOSIS — I480 Paroxysmal atrial fibrillation: Secondary | ICD-10-CM

## 2023-10-06 LAB — CUP PACEART REMOTE DEVICE CHECK
Date Time Interrogation Session: 20250710050404
Implantable Pulse Generator Implant Date: 20230629
Pulse Gen Model: 5000
Pulse Gen Serial Number: 511010609

## 2023-10-08 ENCOUNTER — Ambulatory Visit: Payer: Self-pay | Admitting: Cardiology

## 2023-10-10 ENCOUNTER — Ambulatory Visit (HOSPITAL_COMMUNITY)
Admission: RE | Admit: 2023-10-10 | Discharge: 2023-10-10 | Disposition: A | Discharge status: Home / Self Care | Source: Ambulatory Visit | Attending: Internal Medicine | Admitting: Internal Medicine

## 2023-10-10 DIAGNOSIS — I10 Essential (primary) hypertension: Secondary | ICD-10-CM | POA: Diagnosis present

## 2023-10-10 DIAGNOSIS — I480 Paroxysmal atrial fibrillation: Secondary | ICD-10-CM | POA: Diagnosis not present

## 2023-10-10 LAB — ECHOCARDIOGRAM COMPLETE
Area-P 1/2: 3.51 cm2
S' Lateral: 3.1 cm

## 2023-10-19 NOTE — Progress Notes (Signed)
 [facility] [address] [employer]

## 2023-11-07 ENCOUNTER — Ambulatory Visit (INDEPENDENT_AMBULATORY_CARE_PROVIDER_SITE_OTHER)

## 2023-11-07 DIAGNOSIS — I480 Paroxysmal atrial fibrillation: Secondary | ICD-10-CM | POA: Diagnosis not present

## 2023-11-07 LAB — CUP PACEART REMOTE DEVICE CHECK
Date Time Interrogation Session: 20250810053158
Implantable Pulse Generator Implant Date: 20230629
Pulse Gen Model: 5000
Pulse Gen Serial Number: 511010609

## 2023-11-08 ENCOUNTER — Ambulatory Visit: Payer: Self-pay | Admitting: Cardiology

## 2023-11-22 ENCOUNTER — Other Ambulatory Visit (HOSPITAL_COMMUNITY): Payer: Self-pay

## 2023-11-22 ENCOUNTER — Other Ambulatory Visit (HOSPITAL_COMMUNITY): Payer: Self-pay | Admitting: Orthopedic Surgery

## 2023-11-22 ENCOUNTER — Ambulatory Visit (HOSPITAL_COMMUNITY)
Admission: RE | Admit: 2023-11-22 | Discharge: 2023-11-22 | Disposition: A | Discharge status: Home / Self Care | Source: Ambulatory Visit | Attending: Vascular Surgery | Admitting: Vascular Surgery

## 2023-11-22 ENCOUNTER — Telehealth: Payer: Self-pay

## 2023-11-22 ENCOUNTER — Ambulatory Visit (INDEPENDENT_AMBULATORY_CARE_PROVIDER_SITE_OTHER): Admitting: Pharmacist

## 2023-11-22 VITALS — BP 143/87 | HR 62 | Wt 192.8 lb

## 2023-11-22 DIAGNOSIS — M79605 Pain in left leg: Secondary | ICD-10-CM

## 2023-11-22 DIAGNOSIS — I82412 Acute embolism and thrombosis of left femoral vein: Secondary | ICD-10-CM | POA: Insufficient documentation

## 2023-11-22 MED ORDER — APIXABAN 5 MG PO TABS: 5.0000 mg | ORAL_TABLET | Freq: Two times a day (BID) | ORAL | 1 refills | Status: DC

## 2023-11-22 MED ORDER — APIXABAN (ELIQUIS) VTE STARTER PACK (10MG AND 5MG): ORAL_TABLET | ORAL | Status: DC

## 2023-11-22 NOTE — Patient Instructions (Signed)
-  Start apixaban  (Eliquis ) 10 mg twice daily for 7 days followed by 5 mg twice daily. -Your refills have been sent to [facility]. You may need to call the pharmacy to ask them to fill this when you start to run low on your current supply.  -It is important to take your medication around the same time every day.  -Avoid NSAIDs like ibuprofen (Advil, Motrin) and naproxen (Aleve) as well as aspirin  doses over 100 mg daily. -Tylenol  (acetaminophen ) is the preferred over the counter pain medication to lower the risk of bleeding. -Be sure to alert all of your health care providers that you are taking an anticoagulant prior to starting a new medication or having a procedure. -Monitor for signs and symptoms of bleeding (abnormal bruising, prolonged bleeding, nose bleeds, bleeding from gums, discolored urine, black tarry stools). If you have fallen and hit your head OR if your bleeding is severe or not stopping, seek emergency care.  -Go to the emergency room if emergent signs and symptoms of new clot occur (new or worse swelling and pain in an arm or leg, shortness of breath, chest pain, fast or irregular heartbeats, lightheadedness, dizziness, fainting, coughing up blood) or if you experience a significant color change (pale or blue) in the extremity that has the DVT.  -We recommend you wear compression stockings (20-30 mmHg) as long as you are having swelling or pain. Be sure to purchase the correct size and take them off at night.   If you have any questions or need to reschedule an appointment, please call [phone]. If you are having an emergency, call 911 or present to the nearest emergency room.   What is a DVT?  -Deep vein thrombosis (DVT) is a condition in which a blood clot forms in a vein of the deep venous system which can occur in the lower leg, thigh, pelvis, arm, or neck. This condition is serious and can be life-threatening if the clot travels to the arteries of the lungs  and causing a blockage (pulmonary embolism, PE). A DVT can also damage veins in the leg, which can lead to long-term venous disease, leg pain, swelling, discoloration, and ulcers or sores (post-thrombotic syndrome).  -Treatment may include taking an anticoagulant medication to prevent more clots from forming and the current clot from growing, wearing compression stockings, and/or surgical procedures to remove or dissolve the clot.

## 2023-11-22 NOTE — Telephone Encounter (Signed)
 Patient Advocate Encounter  Test billing for this patients current coverage ([employer] Medicare D) returns copays of $81.30 for 30 days, $243.91 for 90 day of Eliquis .  This patient is not eligible to use copay savings card due to insurance coverage at this time.  This test claim was processed through [facility]- copay amounts may vary at other pharmacies due to pharmacy/plan contracts, or as the patient moves through the different stages of their insurance plan.  Rachel DEL, CPhT Rx Patient Advocate Phone: [phone]

## 2023-11-22 NOTE — Progress Notes (Signed)
 DVT Clinic Note  Name: Wayne Roberson     MRN: [id number]     DOB: [birth year]     Sex: male  PCP: Charlott Dorn LABOR, MD  Today's Visit: Visit Information: Initial Visit  Referred to DVT Clinic by: Orthopedic Surgery - Asberry Remington, PA-C Referred to CPP by: Dr. Magda Reason for referral:  Chief Complaint  Patient presents with   DVT   HISTORY OF PRESENT ILLNESS: Wayne Roberson is a 78 y.o. male with PMH afib s/p ablation in October 2024, COPD, osteoarthritis, prostate cancer, superficial thrombophlebitis, and HTN who presents after diagnosis of DVT for medication management. Of note, was last seen by cardiology in May. He has a cardiac monitor in place and at that time had no recurrent afib episodes since his ablation. He was eager to avoid anticoagulation due to nose bleeds so after discussion with Dr. Cindie, Eliquis  was discontinued and he was started on aspirin  81 mg daily for stroke prevention. Patient was seen at [facility] for left leg pain and swelling today. Referred for ultrasound to rule out DVT. Ultrasound completed today shows acute DVT involving the left femoral, popliteal, posterior tibial, and peroneal veins. Referred to DVT clinic to start treatment. Patient presents to clinic ambulating independently accompanied by his wife. Patient reports he started to notice swelling in his lower left leg over the past 2-3 weeks which prompted him to seek treatment. He has left knee pain, but says this is unchanged and chronic. Receives injections for this and last injection was 09/25/23. Besides his knee, he denies pain and tenderness. Denies prolonged travel in the 4 weeks prior to swelling onset. He did drive to [facility] at the beginning of July which was 13 hours in the car, but he says his symptoms did not begin until over a month after his return. In July, he scraped his knee doing yard work, but denies any recent prolonged sedentary periods related to this. He he has been  completing all his regular activities. When he was on Eliquis  before for afib he experienced nose bleeds. Seen by ENT who recommended use of saline nasal spray to help keep the nasal passages moisturized and also Afrin nasal spray to help stop bleeding. Patient has been off of Eliquis  since May, but he says he still has several months of Eliquis  at home that was filled earlier this year.   Positive Thrombotic Risk Factors: Older Age Bleeding Risk Factors: Age >65 years, Other (comment) (previous nosebleeds while on Eliquis )  Negative Thrombotic Risk Factors: Recent surgery (within 3 months), Recent trauma (within 3 months), Recent admission to hospital with acute illness (within 3 months), Paralysis, paresis, or recent plaster cast immobilization of lower extremity, Central venous catheterization, Sedentary journey lasting >8 hours within 4 weeks, Bed rest >72 hours within 3 months, Within 6 weeks postpartum, Recent cesarean section (within 3 months), Estrogen therapy, Pregnancy, Recent COVID diagnosis (within 3 months), Erythropoiesis-stimulating agent, Testosterone therapy, Active cancer, Non-malignant, chronic inflammatory condition, Known thrombophilic condition, Smoking, Obesity  Rx Insurance Coverage: Medicare Rx Affordability: Eliquis  is $81 for 30 days or $243 for 90 days Rx Assistance Provided: N/A Preferred Pharmacy: [facility]  Past Medical History:  Diagnosis Date   Arthritis    knees   COPD (chronic obstructive pulmonary disease) (HCC)    NEWLY DX , NOW RX'D INHALER PRN , NOT HAD TO USE YET    Deep vein thrombosis (DVT) of calf muscle vein (HCC) 3 YEARS AGO  LEFT CALF , UNSURE HOW IT WAS TREATED    Dyspnea    exercise   Hypertension    Prostate cancer (HCC)    Tinnitus    Trace cataracts     Past Surgical History:  Procedure Laterality Date   ATRIAL FIBRILLATION ABLATION N/A 06/19/2021   Procedure: ATRIAL FIBRILLATION ABLATION;  Surgeon: Cindie Ole DASEN, MD;  Location: [facility];  Service: Cardiovascular;  Laterality: N/A;   ATRIAL FIBRILLATION ABLATION N/A 01/18/2023   Procedure: ATRIAL FIBRILLATION ABLATION;  Surgeon: Cindie Ole DASEN, MD;  Location: [facility];  Service: Cardiovascular;  Laterality: N/A;   CATARACT EXTRACTION, BILATERAL  2018   LEFT ATRIAL APPENDAGE OCCLUSION N/A 09/03/2021   Procedure: LEFT ATRIAL APPENDAGE OCCLUSION;  Surgeon: Cindie Ole DASEN, MD;  Location: [facility];  Service: Cardiovascular;  Laterality: N/A;   PROSTATE BIOPSY     RADIOACTIVE SEED IMPLANT N/A 03/01/2014   Procedure: RADIOACTIVE SEED IMPLANT;  Surgeon: Oliva VEAR Oiler, MD;  Location: [facility];  Service: Urology;  Laterality: N/A;   TEE WITHOUT CARDIOVERSION N/A 09/03/2021   Procedure: TRANSESOPHAGEAL ECHOCARDIOGRAM (TEE);  Surgeon: Cindie Ole DASEN, MD;  Location: [facility] INVASIVE CV [facility];  Service: Cardiovascular;  Laterality: N/A;   TOTAL KNEE ARTHROPLASTY Right 05/15/2018   Procedure: TOTAL KNEE ARTHROPLASTY;  Surgeon: Melodi Lerner, MD;  Location: [facility];  Service: Orthopedics;  Laterality: Right;    VENTRAL HERNIA REPAIR      Social History   Socioeconomic History   Marital status: Married    Spouse name: Not on file   Number of children: Not on file   Years of education: Not on file   Highest education level: Not on file  Occupational History   Not on file  Tobacco Use   Smoking status: Former    Current packs/day: 0.00    Average packs/day: 2.0 packs/day for 42.0 years (84.0 ttl pk-yrs)    Types: Cigarettes    Start date: 03/29/1962    Quit date: 03/29/2004    Years since quitting: 19.6   Smokeless tobacco: Never  Vaping Use   Vaping status: Never Used  Substance and Sexual Activity   Alcohol use: Yes    Alcohol/week: 12.0 standard drinks of alcohol    Types: 10 Glasses of wine, 2 Standard drinks or equivalent per week   Drug use: No   Sexual activity: Never  Other Topics Concern   Not  on file  Social History Narrative   Not on file   Social Drivers of Health   Financial Resource Strain: Not on file  Food Insecurity: Not on file  Transportation Needs: Not on file  Physical Activity: Not on file  Stress: Not on file  Social Connections: Not on file  Intimate Partner Violence: Not on file    Family History  Problem Relation Age of Onset   Heart failure Mother    Heart failure Father    Cancer Neg Hx     Allergies as of 11/22/2023 - Review Complete 11/22/2023  Allergen Reaction Noted   Penicillins  01/04/2014    Current Outpatient Medications on File Prior to Visit  Medication Sig Dispense Refill   aspirin  EC 81 MG tablet Take 1 tablet (81 mg total) by mouth daily. Swallow whole.     fluticasone  (FLONASE ) 50 MCG/ACT nasal spray Place 1 spray into both nostrils daily as needed for allergies or rhinitis.     metoprolol  tartrate (LOPRESSOR ) 25 MG  tablet TAKE 1 TABLET TWICE A DAY 180 tablet 2   Multiple Vitamins-Minerals (PRESERVISION AREDS PO) Take 1 capsule by mouth in the morning and at bedtime.     prednisoLONE acetate (PRED FORTE) 1 % ophthalmic suspension Place 1 drop into the left eye daily.     telmisartan  (MICARDIS ) 80 MG tablet TAKE 1 TABLET DAILY 30 tablet 0   fluocinonide (LIDEX) 0.05 % external solution Apply 1 application  topically See admin instructions. After shower about 5 times weekly     Current Facility-Administered Medications on File Prior to Visit  Medication Dose Route Frequency Provider Last Rate Last Admin   regadenoson  (LEXISCAN ) injection SOLN 0.4 mg  0.4 mg Intravenous Once Turner, Traci R, MD       technetium tetrofosmin  (TC-MYOVIEW ) injection 30.7 millicurie  30.7 millicurie Intravenous Once PRN Turner, Wilbert SAUNDERS, MD       REVIEW OF SYSTEMS:  Review of Systems  Respiratory:  Negative for shortness of breath.   Cardiovascular:  Negative for chest pain.  Musculoskeletal:  Negative for myalgias.  Neurological:  Negative for dizziness.    PHYSICAL EXAMINATION:  There were no vitals filed for this visit.  There is no height or weight on file to calculate BMI.  Physical Exam Pulmonary:     Effort: Pulmonary effort is normal.  Musculoskeletal:        General: No tenderness.     Left lower leg: Edema (moderate around foot, calf, knee) present.  Neurological:     Mental Status: He is alert.    Villalta Score for Post-Thrombotic Syndrome: Pain: Mild Cramps: Absent Heaviness: Absent Paresthesia: Mild Pruritus: Absent Pretibial Edema: Moderate Skin Induration: Absent Hyperpigmentation: Absent Redness: Absent Pain on calf compression: Absent Is venous ulcer present?: No If venous ulcer is present and score is <15, then 15 points total are assigned: Absent  LABS:  CBC     Component Value Date/Time   WBC 7.4 01/04/2023 0909   WBC 7.5 06/19/2021 0555   RBC 5.00 01/04/2023 0909   RBC 4.74 06/19/2021 0555   HGB 14.9 01/04/2023 0909   HCT 45.4 01/04/2023 0909   PLT 175 01/04/2023 0909   MCV 91 01/04/2023 0909   MCH 29.8 01/04/2023 0909   MCH 30.6 06/19/2021 0555   MCHC 32.8 01/04/2023 0909   MCHC 34.0 06/19/2021 0555   RDW 13.3 01/04/2023 0909    Hepatic Function      Component Value Date/Time   PROT 7.2 05/12/2018 1114   ALBUMIN 4.0 05/12/2018 1114   AST 28 05/12/2018 1114   ALT 25 05/12/2018 1114   ALKPHOS 58 05/12/2018 1114   BILITOT 0.8 05/12/2018 1114    Renal Function   Lab Results  Component Value Date   CREATININE 0.81 01/04/2023   CREATININE 0.76 09/29/2022   CREATININE 0.85 12/25/2021    CrCl cannot be calculated (Patient's most recent lab result is older than the maximum 21 days allowed.).   VVS Vascular Lab Studies:  11/22/23 VAS US  LOWER EXTREMITY VENOUS LEFT (DVT): Summary:  RIGHT:  - No evidence of common femoral vein obstruction.    LEFT:  - Findings consistent with acute deep vein thrombosis involving the left femoral vein, left popliteal vein, left posterior tibial veins,  and left peroneal veins.   ASSESSMENT: Location of DVT: Left femoral vein, Left popliteal vein, Left distal vein Cause of DVT: unprovoked  Patient without history of DVT diagnosed with acute DVT involving the left femoral, popliteal, posterior tibial, and peroneal veins.  Indicated to start on anticoagulation. Will start on Eliquis  VTE starter pack dosing. No concerns on labs for Eliquis . He has Eliquis  5 mg tablets on hand at home that were dispensed earlier this year. As his Eliquis  copay is ~$80 per 1 month, he prefers to start taking the medication he already has on hand at home. Counseled to start taking VTE starter pack dosing and provided written instructions for how to take this and patient and his wife expressed understanding. Counseled to ensure the Eliquis  he has at home is not expired prior to taking and reach out to clinic today if it is so that we can get him new medication from the pharmacy. Extensively counseled on Eliquis  and all patient and his wife's questions were answered. He was previously started on aspirin  81 mg daily for stroke prevention with history of afib since he was stopping Eliquis  at the time. Given he is restarting on Eliquis  and does not appear to have another indication for aspirin , would recommend to discontinue aspirin , but encouraged him to discuss with cardiology as well which he said he would do. Discussed monitoring for signs of bleeding and using the same measures previously recommended by ENT for nosebleeds while taking Eliquis . No clear, strong provoking factors identified today. He did have prolonged travel at the beginning of July, but this was over a month before his symptom onset. He also hurt his knee doing yard work in July, but this did not lead to any prolonged sedentary periods and he continued to be active. Will refer to hematology for further work up and management. Counseled to reach out if he has any other questions or concerns.  PLAN: -Start apixaban   (Eliquis ) 10 mg twice daily for 7 days followed by 5 mg twice daily. -Expected duration of therapy: per hematology. Therapy started on 11/22/23. -Patient educated on purpose, proper use and potential adverse effects of apixaban  (Eliquis ). -Discussed importance of taking medication around the same time every day. -Advised patient of medications to avoid (NSAIDs, aspirin  doses >100 mg daily). -Educated that Tylenol  (acetaminophen ) is the preferred analgesic to lower the risk of bleeding. -Advised patient to alert all providers of anticoagulation therapy prior to starting a new medication or having a procedure. -Emphasized importance of monitoring for signs and symptoms of bleeding (abnormal bruising, prolonged bleeding, nose bleeds, bleeding from gums, discolored urine, black tarry stools). -Educated patient to present to the ED if emergent signs and symptoms of new thrombosis occur. -Counseled patient to wear compression stockings daily, removing at night.  Follow up: Referred to hematology, DVT clinic as needed  Izetta Henry, PharmD Deep [facility]in [facility]rombosis [facility] Clinical Pharmacist

## 2023-12-05 NOTE — Progress Notes (Unsigned)
   Electrophysiology Office Note:   Date:  12/06/2023  ID:  Wayne Roberson, DOB 07-15-1945, MRN [id number]  Primary Cardiologist: Oneil Parchment, MD Electrophysiologist: OLE ONEIDA HOLTS, MD      History of Present Illness:   Wayne Roberson is a 78 y.o. male with h/o AF s/p 2 ablations and s/p loop recorder seen today for acute visit due to recent diagnosis of DVT at pt request.   Patient reports doing well from a cardiac perspective. No breakthrough arrhythmia of which he is aware; or by his loop recorder (transmitted most recently 9/9 - No alerts - pending full transmission 9/11). Otherwise, he denies chest pain, palpitations, dyspnea, PND, orthopnea, nausea, vomiting, dizziness, syncope,  weight gain, or early satiety.    LLE swelling has greatly improved after starting OAC. Pending hematology follow up 9/24.  Review of systems complete and found to be negative unless listed in HPI.   Studies Reviewed:    EKG is ordered today. Personal review as below.  EKG Interpretation Date/Time:  Tuesday December 06 2023 08:35:44 EDT Ventricular Rate:  60 PR Interval:  184 QRS Duration:  90 QT Interval:  390 QTC Calculation: 390 R Axis:   -2  Text Interpretation: Normal sinus rhythm Normal ECG When compared with ECG of 20-Apr-2023 11:57, No significant change was found Confirmed by Lesia Sharper [phone]) on 12/06/2023 8:41:41 AM    Arrhythmia/Device History Abbott Loop 08/2021 S/p AF ablation 05/2021 Redo ablation 12/2022   Physical Exam:   VS:  BP 136/74   Pulse 60   Ht 5' 10 (1.778 m)   Wt 192 lb 9.6 oz (87.4 kg)   SpO2 92%   BMI 27.64 kg/m    Wt Readings from Last 3 Encounters:  12/06/23 192 lb 9.6 oz (87.4 kg)  11/22/23 192 lb 12.8 oz (87.5 kg)  08/26/23 189 lb (85.7 kg)     GEN: No acute distress NECK: No JVD; No carotid bruits CARDIAC: Regular rate and rhythm, no murmurs, rubs, gallops RESPIRATORY:  Clear to auscultation without rales, wheezing or rhonchi  ABDOMEN:  Soft, non-tender, non-distended EXTREMITIES:  No edema; No deformity   ILR Interrogation- reviewed in detail today,  See PACEART report  ASSESSMENT AND PLAN:    Atrial fibrillation s/p Abbott Loop recorder S/p ablation 05/2021 and 12/2022 Continue eliquis  5 mg BID for CHA2DS2VASc of at least 6 now s/p DVT Normal device function See Pace Art report No changes today  DVT No clear provocation Length of therapy for [facility] per Hematology.  Given significant CHA2DS2VASc now, pt verbalizes understanding that recommendation may be for lifelong OAC.   Follow up with EP Team in 6 months  Signed, Sharper Prentice Lesia, PA-C

## 2023-12-06 ENCOUNTER — Encounter: Payer: Self-pay | Admitting: Student

## 2023-12-06 ENCOUNTER — Ambulatory Visit: Attending: Student | Admitting: Student

## 2023-12-06 VITALS — BP 136/74 | HR 60 | Ht 70.0 in | Wt 192.6 lb

## 2023-12-06 DIAGNOSIS — I824Z9 Acute embolism and thrombosis of unspecified deep veins of unspecified distal lower extremity: Secondary | ICD-10-CM | POA: Diagnosis present

## 2023-12-06 DIAGNOSIS — I480 Paroxysmal atrial fibrillation: Secondary | ICD-10-CM | POA: Insufficient documentation

## 2023-12-06 NOTE — Patient Instructions (Signed)
 Medication Instructions:  Your physician recommends that you continue on your current medications as directed. Please refer to the Current Medication list given to you today.  *If you need a refill on your cardiac medications before your next appointment, please call your pharmacy*  Lab Work: None ordered If you have labs (blood work) drawn today and your tests are completely normal, you will receive your results only by: MyChart Message (if you have MyChart) OR A paper copy in the mail If you have any lab test that is abnormal or we need to change your treatment, we will call you to review the results  Follow-Up: At [facility], you and your health needs are our priority.  As part of our continuing mission to provide you with exceptional heart care, our providers are all part of one team.  This team includes your primary Cardiologist (physician) and Advanced Practice Providers or APPs (Physician Assistants and Nurse Practitioners) who all work together to provide you with the care you need, when you need it.  Your next appointment:   6 month(s)  Provider:   Ole Holts, MD

## 2023-12-08 ENCOUNTER — Ambulatory Visit (INDEPENDENT_AMBULATORY_CARE_PROVIDER_SITE_OTHER)

## 2023-12-08 DIAGNOSIS — I480 Paroxysmal atrial fibrillation: Secondary | ICD-10-CM | POA: Diagnosis not present

## 2023-12-08 LAB — CUP PACEART REMOTE DEVICE CHECK
Date Time Interrogation Session: 20250910050311
Implantable Pulse Generator Implant Date: 20230629
Pulse Gen Model: 5000
Pulse Gen Serial Number: 511010609

## 2023-12-11 ENCOUNTER — Ambulatory Visit: Payer: Self-pay | Admitting: Cardiology

## 2023-12-16 NOTE — Progress Notes (Signed)
 Remote Loop Recorder Transmission

## 2023-12-21 ENCOUNTER — Inpatient Hospital Stay (HOSPITAL_BASED_OUTPATIENT_CLINIC_OR_DEPARTMENT_OTHER): Admitting: Hematology and Oncology

## 2023-12-21 ENCOUNTER — Inpatient Hospital Stay: Attending: Hematology and Oncology

## 2023-12-21 VITALS — BP 127/68 | HR 83 | Temp 97.9°F | Resp 18 | Ht 70.0 in | Wt 192.4 lb

## 2023-12-21 DIAGNOSIS — D6851 Activated protein C resistance: Secondary | ICD-10-CM | POA: Insufficient documentation

## 2023-12-21 DIAGNOSIS — I82412 Acute embolism and thrombosis of left femoral vein: Secondary | ICD-10-CM | POA: Insufficient documentation

## 2023-12-21 DIAGNOSIS — I82402 Acute embolism and thrombosis of unspecified deep veins of left lower extremity: Secondary | ICD-10-CM | POA: Insufficient documentation

## 2023-12-21 DIAGNOSIS — Z8546 Personal history of malignant neoplasm of prostate: Secondary | ICD-10-CM | POA: Insufficient documentation

## 2023-12-21 DIAGNOSIS — Z7901 Long term (current) use of anticoagulants: Secondary | ICD-10-CM

## 2023-12-21 NOTE — Progress Notes (Signed)
 [facility] CONSULT NOTE  Patient Care Team: Charlott Dorn LABOR, MD as PCP - General (Internal Medicine) Jeffrie Oneil BROCKS, MD as PCP - Cardiology (Cardiology) Cindie Ole DASEN, MD as PCP - Electrophysiology (Cardiology)  CHIEF COMPLAINTS/PURPOSE OF CONSULTATION:  DVT left leg  HISTORY OF PRESENTING ILLNESS:    History of Present Illness Wayne Roberson is an 78 year old male with atrial fibrillation who presents with a blood clot in the leg. He was referred by a [facility] for evaluation of a recent diagnosis of blood clots.  He experienced swelling in his lower leg and foot following a fall where he hit his knee. An ultrasound confirmed a deep vein thrombosis (DVT) in his leg. Eliquis  was restarted at a higher dose of two tablets twice a day for a week, then reduced to one tablet twice a day.  He has a history of atrial fibrillation and was previously on Eliquis , which was discontinued after six months without an AFib event. A significant episode of epistaxis in December contributed to the decision to stop the anticoagulant. He also had a superficial vein thrombosis in 2016, which was not treated with anticoagulants.  He has a family history of blood clots, with his mother having died from a pulmonary embolism. He experienced a second bout of COVID-19 in November, followed by the flu and a bacterial infection, leading to prolonged malaise. He has a history of prostate cancer treated with Scenia implants, with negligible PSA levels for two years.  He quit smoking in 2005 after 42 years. He remains active, working around his yard and pond, and did not experience prolonged immobility after his recent fall.  I reviewed her records extensively and collaborated the history with the patient.    MEDICAL HISTORY:  Past Medical History:  Diagnosis Date   Arthritis    knees   COPD (chronic obstructive pulmonary disease) (HCC)    NEWLY DX , NOW RX'D INHALER PRN , NOT HAD TO  USE YET    Deep vein thrombosis (DVT) of calf muscle vein (HCC) 3 YEARS AGO   LEFT CALF , UNSURE HOW IT WAS TREATED    Dyspnea    exercise   Hypertension    Prostate cancer (HCC)    Tinnitus    Trace cataracts     SURGICAL HISTORY: Past Surgical History:  Procedure Laterality Date   ATRIAL FIBRILLATION ABLATION N/A 06/19/2021   Procedure: ATRIAL FIBRILLATION ABLATION;  Surgeon: Cindie Ole DASEN, MD;  Location: [facility];  Service: Cardiovascular;  Laterality: N/A;   ATRIAL FIBRILLATION ABLATION N/A 01/18/2023   Procedure: ATRIAL FIBRILLATION ABLATION;  Surgeon: Cindie Ole DASEN, MD;  Location: [facility];  Service: Cardiovascular;  Laterality: N/A;   CATARACT EXTRACTION, BILATERAL  2018   LEFT ATRIAL APPENDAGE OCCLUSION N/A 09/03/2021   Procedure: LEFT ATRIAL APPENDAGE OCCLUSION;  Surgeon: Cindie Ole DASEN, MD;  Location: [facility];  Service: Cardiovascular;  Laterality: N/A;   PROSTATE BIOPSY     RADIOACTIVE SEED IMPLANT N/A 03/01/2014   Procedure: RADIOACTIVE SEED IMPLANT;  Surgeon: Oliva VEAR Oiler, MD;  Location: [facility];  Service: Urology;  Laterality: N/A;   TEE WITHOUT CARDIOVERSION N/A 09/03/2021   Procedure: TRANSESOPHAGEAL ECHOCARDIOGRAM (TEE);  Surgeon: Cindie Ole DASEN, MD;  Location: [facility] INVAS[facility];  Service: Cardiovascular;  Laterality: N/A;   TOTAL KNEE ARTHROPLASTY Right 05/15/2018   Procedure: TOTAL KNEE ARTHROPLASTY;  Surgeon: Melodi Dempsey, MD;  Location: [facility];  Service: Orthopedics;  Laterality: Right;    VENTRAL HERNIA REPAIR      SOCIAL HISTORY: Social History   Socioeconomic History   Marital status: Married    Spouse name: Not on file   Number of children: Not on file   Years of education: Not on file   Highest education level: Not on file  Occupational History   Not on file  Tobacco Use   Smoking status: Former    Current packs/day: 0.00    Average packs/day: 2.0 packs/day for 42.0 years (84.0  ttl pk-yrs)    Types: Cigarettes    Start date: 03/29/1962    Quit date: 03/29/2004    Years since quitting: 19.7   Smokeless tobacco: Never  Vaping Use   Vaping status: Never Used  Substance and Sexual Activity   Alcohol use: Yes    Alcohol/week: 12.0 standard drinks of alcohol    Types: 10 Glasses of wine, 2 Standard drinks or equivalent per week   Drug use: No   Sexual activity: Never  Other Topics Concern   Not on file  Social History Narrative   Not on file   Social Drivers of Health   Financial Resource Strain: Not on file  Food Insecurity: No Food Insecurity (12/21/2023)   Hunger Vital Sign    Worried About Running Out of Food in the Last Year: Never true    Ran Out of Food in the Last Year: Never true  Transportation Needs: No Transportation Needs (12/21/2023)   PRAPARE - Administrator, Civil Service (Medical): No    Lack of Transportation (Non-Medical): No  Physical Activity: Not on file  Stress: Not on file  Social Connections: Not on file  Intimate Partner Violence: Not At Risk (12/21/2023)   Humiliation, Afraid, Rape, and Kick questionnaire    Fear of Current or Ex-Partner: No    Emotionally Abused: No    Physically Abused: No    Sexually Abused: No    FAMILY HISTORY: Family History  Problem Relation Age of Onset   Heart failure Mother    Heart failure Father    Cancer Neg Hx     ALLERGIES:  is allergic to penicillins.  MEDICATIONS:  Current Outpatient Medications  Medication Sig Dispense Refill   apixaban  (ELIQUIS ) 5 MG TABS tablet Take 1 tablet (5 mg total) by mouth 2 (two) times daily. Start taking after completion of starter dosing. 60 tablet 1   fluocinonide (LIDEX) 0.05 % external solution Apply 1 application  topically See admin instructions. After shower about 5 times weekly     fluticasone  (FLONASE ) 50 MCG/ACT nasal spray Place 1 spray into both nostrils daily as needed for allergies or rhinitis.     metoprolol  tartrate (LOPRESSOR ) 25  MG tablet TAKE 1 TABLET TWICE A DAY 180 tablet 2   Multiple Vitamins-Minerals (PRESERVISION AREDS 2 PO) Take by mouth. (Patient taking differently: Take by mouth. Twice daily)     prednisoLONE acetate (PRED FORTE) 1 % ophthalmic suspension Place 1 drop into the left eye daily.     telmisartan  (MICARDIS ) 80 MG tablet TAKE 1 TABLET DAILY 30 tablet 0   No current facility-administered medications for this visit.   Facility-Administered Medications Ordered in Other Visits  Medication Dose Route Frequency Provider Last Rate Last Admin   regadenoson  (LEXISCAN ) injection SOLN 0.4 mg  0.4 mg Intravenous Once Turner, Traci R, MD       technetium tetrofosmin  (TC-MYOVIEW ) injection 30.7 millicurie  30.7 millicurie  Intravenous Once PRN Shlomo Wilbert SAUNDERS, MD        REVIEW OF SYSTEMS:   Constitutional: Denies fevers, chills or abnormal night sweats   All other systems were reviewed with the patient and are negative.  PHYSICAL EXAMINATION: ECOG PERFORMANCE STATUS: 1 - Symptomatic but completely ambulatory  Vitals:   12/21/23 1307  BP: 127/68  Pulse: 83  Resp: 18  Temp: 97.9 F (36.6 C)   Filed Weights   12/21/23 1307  Weight: 192 lb 6.4 oz (87.3 kg)    GENERAL:alert, no distress and comfortable    LABORATORY DATA:  I have reviewed the data as listed Lab Results  Component Value Date   WBC 7.4 01/04/2023   HGB 14.9 01/04/2023   HCT 45.4 01/04/2023   MCV 91 01/04/2023   PLT 175 01/04/2023   Lab Results  Component Value Date   NA 141 01/04/2023   K 4.6 01/04/2023   CL 102 01/04/2023   CO2 22 01/04/2023    RADIOGRAPHIC STUDIES: I have personally reviewed the radiological reports and agreed with the findings in the report.  ASSESSMENT AND PLAN:  Leg DVT (deep venous thromboembolism), acute, left (HCC) 11/22/2023: Acute DVT left femoral vein, left popliteal vein, left posterior tibial and left peroneal veins    I discussed with the patient risk factors for blood  clots.  Inherited risk factors include: 1. Factor V Leiden mutation 2. Prothrombin gene G20210A 3. Protein S deficiency  4. Protein C deficiency  5. Antithrombin deficiency  Acquired risk factors include: 1. Antiphospholipid antibody syndrome 2. Tobacco use: He quit smoking in 2005 3. Obesity: His weight appears to be appropriate 4. Medications: No medications that could cause of 5. Sedentary behavior including postoperative state: He stays fairly active 6. Foreign bodies in circulation: None 7.  No concern for cancer because he had a recent negative PSA test with a history of prostate cancer treated with seed implantation, recent colonoscopy was also negative.  Possible etiology could be from the recent fall with injury to the left knee.  Workup recommended: Bloodwork to evaluate for the 5 inherited factors mentioned above along with antiphospholipid antibodies. Return to clinic in one to 2 weeks to discuss the results of the tests  History of atrial fibrillation: 2 months ago he discontinued anticoagulation because he no longer was in A-fib.  He is now back on Eliquis .  Duration of anticoagulation: If no identifiable risk factors are found then we would continue anticoagulation for 6 months and repeat an ultrasound and D-dimer and if negative he will discontinue blood thinners. If there are risk factors detected on the blood test we will have to make a decision based upon which risk factor that might be. If lupus anticoagulant test was positive then we would consider repeating it in 3 months and discussed lifelong anticoagulation.      All questions were answered. The patient knows to call the clinic with any problems, questions or concerns.    Viinay K Jakell Trusty, MD 12/21/23

## 2023-12-21 NOTE — Assessment & Plan Note (Signed)
 11/22/2023: Acute DVT left femoral vein, left popliteal vein, left posterior tibial and left peroneal veins    I discussed with the patient risk factors for blood clots.  Inherited risk factors include: 1. Factor V Leiden mutation 2. Prothrombin gene G20210A 3. Protein S deficiency  4. Protein C deficiency  5. Antithrombin deficiency  Acquired risk factors include: 1. Antiphospholipid antibody syndrome 2. Tobacco use 3. Obesity 4. Medications including oral contraceptives 5. Sedentary behavior including postoperative state 6. Foreign bodies in circulation  Workup recommended: Bloodwork to evaluate for the 5 inherited factors mentioned above along with antiphospholipid antibodies. Return to clinic in one to 2 weeks to discuss the results of the tests

## 2023-12-22 LAB — ANTITHROMBIN III ANTIGEN: AT III AG PPP IMM-ACNC: 79 % (ref 72–124)

## 2023-12-22 LAB — PROTEIN S, TOTAL: Protein S Ag, Total: 88 % (ref 60–150)

## 2023-12-22 LAB — HOMOCYSTEINE: Homocysteine: 14.4 umol/L (ref 0.0–19.2)

## 2023-12-22 NOTE — Progress Notes (Signed)
 Remote Loop Recorder Transmission

## 2023-12-23 LAB — CARDIOLIPIN ANTIBODIES, IGG, IGM, IGA
Anticardiolipin IgA: <9 U/mL (ref 0–11)
Anticardiolipin IgG: <9 GPL U/mL (ref 0–14)
Anticardiolipin IgM: <9 [MPL'U]/mL (ref 0–12)

## 2023-12-23 LAB — PROTEIN C, TOTAL: Protein C, Total: 86 % (ref 60–150)

## 2023-12-23 LAB — BETA-2-GLYCOPROTEIN I ABS, IGG/M/A
Beta-2 Glyco I IgG: <9 GPI IgG units (ref 0–20)
Beta-2-Glycoprotein I IgA: <9 GPI IgA units (ref 0–25)
Beta-2-Glycoprotein I IgM: <9 GPI IgM units (ref 0–32)

## 2023-12-23 LAB — LUPUS ANTICOAGULANT PANEL
DRVVT: 41.8 s (ref 0.0–47.0)
PTT Lupus Anticoagulant: 32.2 s (ref 0.0–43.5)

## 2023-12-26 LAB — FACTOR 5 LEIDEN

## 2023-12-26 LAB — PROTHROMBIN GENE MUTATION

## 2023-12-30 ENCOUNTER — Other Ambulatory Visit: Payer: Self-pay | Admitting: Student

## 2023-12-30 DIAGNOSIS — M25562 Pain in left knee: Secondary | ICD-10-CM

## 2024-01-03 ENCOUNTER — Other Ambulatory Visit: Payer: Self-pay | Admitting: Vascular Surgery

## 2024-01-03 DIAGNOSIS — I82412 Acute embolism and thrombosis of left femoral vein: Secondary | ICD-10-CM

## 2024-01-04 ENCOUNTER — Encounter: Payer: Self-pay | Admitting: Hematology and Oncology

## 2024-01-05 ENCOUNTER — Inpatient Hospital Stay: Attending: Hematology and Oncology | Admitting: Hematology and Oncology

## 2024-01-05 VITALS — BP 140/70 | HR 72 | Temp 97.7°F | Resp 16 | Wt 198.0 lb

## 2024-01-05 DIAGNOSIS — Z7901 Long term (current) use of anticoagulants: Secondary | ICD-10-CM | POA: Diagnosis not present

## 2024-01-05 DIAGNOSIS — D6851 Activated protein C resistance: Secondary | ICD-10-CM | POA: Diagnosis not present

## 2024-01-05 DIAGNOSIS — I82402 Acute embolism and thrombosis of unspecified deep veins of left lower extremity: Secondary | ICD-10-CM | POA: Diagnosis not present

## 2024-01-05 DIAGNOSIS — I82412 Acute embolism and thrombosis of left femoral vein: Secondary | ICD-10-CM | POA: Insufficient documentation

## 2024-01-05 DIAGNOSIS — Z8546 Personal history of malignant neoplasm of prostate: Secondary | ICD-10-CM | POA: Insufficient documentation

## 2024-01-05 DIAGNOSIS — I82452 Acute embolism and thrombosis of left peroneal vein: Secondary | ICD-10-CM | POA: Insufficient documentation

## 2024-01-05 DIAGNOSIS — I82432 Acute embolism and thrombosis of left popliteal vein: Secondary | ICD-10-CM | POA: Diagnosis not present

## 2024-01-05 DIAGNOSIS — I82442 Acute embolism and thrombosis of left tibial vein: Secondary | ICD-10-CM | POA: Insufficient documentation

## 2024-01-05 NOTE — Progress Notes (Signed)
 Remote Loop Recorder Transmission

## 2024-01-05 NOTE — Progress Notes (Signed)
 Patient Care Team: Charlott Dorn LABOR, MD as PCP - General (Internal Medicine) Jeffrie Oneil BROCKS, MD as PCP - Cardiology (Cardiology) Cindie Ole DASEN, MD as PCP - Electrophysiology (Cardiology)  DIAGNOSIS:  Encounter Diagnosis  Name Primary?   Leg DVT (deep venous thromboembolism), acute, left (HCC) Yes    CHIEF COMPLIANT: Follow-up of blood work for hypercoagulability  HISTORY OF PRESENT ILLNESS:  History of Present Illness Wayne Roberson is an 78 year old male with Factor V Leiden who presents for follow-up regarding his blood clotting risk.  He experienced his first blood clot in August 2025 and is currently on anticoagulation therapy. He is concerned about the risk of recurrent clots and seeks to discuss management strategies.  He wears compression socks, especially during travel or prolonged periods of inactivity, to prevent clots. Initially, he wore them on the affected leg but now uses them on both legs despite discomfort.  His family history includes discussions with his daughter and sister about the genetic nature of Factor V Leiden.       ALLERGIES:  is allergic to penicillins.  MEDICATIONS:  Current Outpatient Medications  Medication Sig Dispense Refill   ELIQUIS  5 MG TABS tablet TAKE 1 TABLET TWICE DAILY. START TAKING AFTER COMPLETION OF STARTER DOSING 60 tablet 11   fluocinonide (LIDEX) 0.05 % external solution Apply 1 application  topically See admin instructions. After shower about 5 times weekly     fluticasone  (FLONASE ) 50 MCG/ACT nasal spray Place 1 spray into both nostrils daily as needed for allergies or rhinitis.     metoprolol  tartrate (LOPRESSOR ) 25 MG tablet TAKE 1 TABLET TWICE A DAY 180 tablet 2   Multiple Vitamins-Minerals (PRESERVISION AREDS 2 PO) Take by mouth. (Patient taking differently: Take by mouth. Twice daily)     prednisoLONE acetate (PRED FORTE) 1 % ophthalmic suspension Place 1 drop into the left eye daily.     telmisartan  (MICARDIS )  80 MG tablet TAKE 1 TABLET DAILY 30 tablet 0   No current facility-administered medications for this visit.   Facility-Administered Medications Ordered in Other Visits  Medication Dose Route Frequency Provider Last Rate Last Admin   regadenoson  (LEXISCAN ) injection SOLN 0.4 mg  0.4 mg Intravenous Once Turner, Wilbert JONELLE, MD       technetium tetrofosmin  (TC-MYOVIEW ) injection 30.7 millicurie  30.7 millicurie Intravenous Once PRN Shlomo Wilbert JONELLE, MD        PHYSICAL EXAMINATION: ECOG PERFORMANCE STATUS: 1 - Symptomatic but completely ambulatory  Vitals:   01/05/24 1345  BP: (!) 140/70  Pulse: 72  Resp: 16  Temp: 97.7 F (36.5 C)  SpO2: 95%   Filed Weights   01/05/24 1345  Weight: 198 lb (89.8 kg)      LABORATORY DATA:  I have reviewed the data as listed    Latest Ref Rng & Units 01/04/2023    9:09 AM 09/29/2022    9:29 AM 12/25/2021    8:31 AM  CMP  Glucose 70 - 99 mg/dL 888  894  893   BUN 8 - 27 mg/dL 12  13  15    Creatinine 0.76 - 1.27 mg/dL 9.18  9.23  9.14   Sodium 134 - 144 mmol/L 141  138  141   Potassium 3.5 - 5.2 mmol/L 4.6  4.8  4.7   Chloride 96 - 106 mmol/L 102  101  102   CO2 20 - 29 mmol/L 22  22  24    Calcium 8.6 - 10.2 mg/dL 9.4  9.1  9.1     Lab Results  Component Value Date   WBC 7.4 01/04/2023   HGB 14.9 01/04/2023   HCT 45.4 01/04/2023   MCV 91 01/04/2023   PLT 175 01/04/2023    ASSESSMENT & PLAN:  Leg DVT (deep venous thromboembolism), acute, left (HCC) 11/22/2023: Acute DVT left femoral vein, left popliteal vein, left posterior tibial and left peroneal veins  Hypercoagulability workup: No lupus anticoagulant, Antithrombin III : 79%, protein S: 88%, protein C: 86%, prothrombin gene mutation: Negative, factor V Leiden: c.1601G>A (p.[id number]) - Detected, Heterozygous   Factor V Leiden mutation: I discussed with the patient the significance of factor V Leiden and mechanism of causing blood clots. However just the presence of factor V Leiden does not  indicate indefinite anticoagulation.  Recommendation: 6 months of anticoagulation Ultrasound evaluation end of February 2026. If the ultrasound is negative and patient's symptoms have resolved, then we can consider discontinuation of anticoagulation therapy. High risk situations: Likely postoperative or long distance travel, patient should consider prophylactic anticoagulation during those episodes.  Compression socks would also be helpful in travel.  Return to clinic at the end of February after undergoing ultrasound evaluation.  We will do a telephone visit to discuss results  ------------------------------------- Assessment and Plan Assessment & Plan Deep vein thrombosis of left lower extremity in the setting of Factor V Leiden thrombophilia DVT in left lower extremity likely due to Factor V Leiden thrombophilia. No other predisposing factors. Current Apixaban  treatment. Increased risk of recurrent DVT due to Factor V Leiden, but lifelong anticoagulation not recommended unless recurrence. - Continue Apixaban  for six months. - Schedule ultrasound of left lower extremity in February to assess DVT resolution. - Advise wearing compression socks during travel or prolonged immobility. - Discuss knee replacement surgery timing with orthopedic surgeon post-anticoagulation. - Discontinue Apixaban  if February ultrasound shows no DVT.  Factor V Leiden thrombophilia Inherited condition increasing clot risk due to Factor V mutation. Lifelong anticoagulation not necessary unless recurrence. Family members may consider testing. - Educate family on genetic nature and testing need, especially if considering hormonal contraceptives or surgery. - Advise family to avoid smoking and inform healthcare providers of thrombosis risk.      No orders of the defined types were placed in this encounter.  The patient has a good understanding of the overall plan. he agrees with it. he will call with any problems  that may develop before the next visit here.  I personally spent a total of 30 minutes in the care of the patient today including preparing to see the patient, getting/reviewing separately obtained history, performing a medically appropriate exam/evaluation, counseling and educating, placing orders, referring and communicating with other health care professionals, documenting clinical information in the EHR, independently interpreting results, communicating results, and coordinating care.   Viinay K Dominion Kathan, MD 01/05/24

## 2024-01-05 NOTE — Assessment & Plan Note (Signed)
 11/22/2023: Acute DVT left femoral vein, left popliteal vein, left posterior tibial and left peroneal veins  Hypercoagulability workup: No lupus anticoagulant, Antithrombin III : 79%, protein S: 88%, protein C: 86%, prothrombin gene mutation: Negative, factor V Leiden: c.1601G>A (p.[id number]) - Detected, Heterozygous   Factor V Leiden mutation: I discussed with the patient the significance of factor V Leiden and mechanism of causing blood clots. However just the presence of factor V Leiden does not indicate indefinite anticoagulation.  Recommendation: 6 months of anticoagulation Ultrasound evaluation end of February 2026. If the ultrasound is negative and patient's symptoms have resolved, then we can consider discontinuation of anticoagulation therapy. High risk situations: Likely postoperative or long distance travel, patient should consider prophylactic anticoagulation during those episodes.  Compression socks would also be helpful in travel.  Return to clinic at the end of February after undergoing ultrasound evaluation

## 2024-01-09 ENCOUNTER — Ambulatory Visit (INDEPENDENT_AMBULATORY_CARE_PROVIDER_SITE_OTHER)

## 2024-01-09 ENCOUNTER — Ambulatory Visit: Payer: Self-pay | Admitting: Cardiology

## 2024-01-09 DIAGNOSIS — I480 Paroxysmal atrial fibrillation: Secondary | ICD-10-CM

## 2024-01-09 LAB — CUP PACEART REMOTE DEVICE CHECK
Date Time Interrogation Session: 20251013050052
Implantable Pulse Generator Implant Date: 20230629
Pulse Gen Model: 5000
Pulse Gen Serial Number: 511010609

## 2024-01-10 NOTE — Progress Notes (Signed)
 Remote Loop Recorder Transmission

## 2024-02-09 ENCOUNTER — Ambulatory Visit: Attending: Cardiology

## 2024-02-09 DIAGNOSIS — I480 Paroxysmal atrial fibrillation: Secondary | ICD-10-CM | POA: Diagnosis not present

## 2024-02-09 LAB — CUP PACEART REMOTE DEVICE CHECK
Date Time Interrogation Session: 20251113045618
Implantable Pulse Generator Implant Date: 20230629
Pulse Gen Model: 5000
Pulse Gen Serial Number: 511010609

## 2024-02-13 ENCOUNTER — Ambulatory Visit
Admission: RE | Admit: 2024-02-13 | Discharge: 2024-02-13 | Disposition: A | Discharge status: Home / Self Care | Source: Ambulatory Visit | Attending: Student | Admitting: Student

## 2024-02-13 DIAGNOSIS — M25562 Pain in left knee: Secondary | ICD-10-CM

## 2024-02-14 ENCOUNTER — Ambulatory Visit: Payer: Self-pay | Admitting: Cardiology

## 2024-02-14 NOTE — Progress Notes (Signed)
 Remote Loop Recorder Transmission

## 2024-03-11 ENCOUNTER — Ambulatory Visit

## 2024-03-11 DIAGNOSIS — I480 Paroxysmal atrial fibrillation: Secondary | ICD-10-CM | POA: Diagnosis not present

## 2024-03-14 LAB — CUP PACEART REMOTE DEVICE CHECK
Date Time Interrogation Session: 20251216122609
Implantable Pulse Generator Implant Date: 20230629
Pulse Gen Model: 5000
Pulse Gen Serial Number: 511010609

## 2024-03-16 ENCOUNTER — Ambulatory Visit: Payer: Self-pay | Admitting: Cardiology

## 2024-03-16 NOTE — Progress Notes (Signed)
 Remote Loop Recorder Transmission

## 2024-04-09 ENCOUNTER — Telehealth (HOSPITAL_BASED_OUTPATIENT_CLINIC_OR_DEPARTMENT_OTHER): Payer: Self-pay | Admitting: *Deleted

## 2024-04-09 NOTE — Telephone Encounter (Signed)
"  ° °  Pre-operative Risk Assessment    Patient Name: Wayne Roberson  DOB: 09/14/1945 MRN: [id number]   Date of last office visit: 12/06/2023 Date of next office visit: 06/13/2023  Request for Surgical Clearance    Procedure:  Left total knee arthroplasty  Date of Surgery:  Clearance 06/04/24                                 Surgeon:  Dr. Dempsey Moan Surgeon's Group or Practice Name:  [facility] Phone number:  [phone] Fax number:  [phone]   Type of Clearance Requested:   - Medical  - Pharmacy:  Hold Apixaban  (Eliquis ) Not Indicated   Type of Anesthesia:  Choice   Additional requests/questions:    Signed, Edsel Grayce Sanders   04/09/2024, 4:25 PM   "

## 2024-04-11 ENCOUNTER — Ambulatory Visit

## 2024-04-11 DIAGNOSIS — I480 Paroxysmal atrial fibrillation: Secondary | ICD-10-CM | POA: Diagnosis not present

## 2024-04-11 LAB — CUP PACEART REMOTE DEVICE CHECK
Date Time Interrogation Session: 20260114050222
Implantable Pulse Generator Implant Date: 20230629
Pulse Gen Model: 5000
Pulse Gen Serial Number: 511010609

## 2024-04-15 ENCOUNTER — Ambulatory Visit: Payer: Self-pay | Admitting: Cardiology

## 2024-04-16 NOTE — Progress Notes (Signed)
 Remote Loop Recorder Transmission

## 2024-04-23 NOTE — Telephone Encounter (Signed)
 Patient with diagnosis of afib/factor 5 leiden/DVT on Eliquis  for anticoagulation.    Procedure: Left total knee arthroplasty  Date of procedure: 06/05/23   CHA2DS2-VASc Score = 4   This indicates a 4.8% annual risk of stroke. The patient's score is based upon: CHF History: 0 HTN History: 1 Diabetes History: 0 Stroke History: 0 Vascular Disease History: 1 Age Score: 2 Gender Score: 0      CrCl 82 Platelet count 175  Pt has DVT   Patient has not had an Afib/aflutter ablation in the last 3 months, DCCV within the last 4 weeks or a watchman implanted in the last 45 days   Patient had DVT in Aug 2025. Dx with factor V leiden. He will have been > 6 month from DVT at time of surgey. However since his anticoag indication is partially for hematology reason, will defer to Dr. Odean.    **This guidance is not considered finalized until pre-operative APP has relayed final recommendations.**

## 2024-04-30 ENCOUNTER — Other Ambulatory Visit: Payer: Self-pay | Admitting: Student

## 2024-05-12 ENCOUNTER — Ambulatory Visit

## 2024-05-25 ENCOUNTER — Ambulatory Visit (HOSPITAL_COMMUNITY)

## 2024-06-04 ENCOUNTER — Ambulatory Visit (HOSPITAL_COMMUNITY): Admit: 2024-06-04 | Admitting: Orthopedic Surgery

## 2024-06-04 SURGERY — ARTHROPLASTY, KNEE, TOTAL
Anesthesia: Choice | Site: Knee | Laterality: Left

## 2024-06-12 ENCOUNTER — Ambulatory Visit: Admitting: Cardiology

## 2024-06-12 ENCOUNTER — Ambulatory Visit

## 2024-07-13 ENCOUNTER — Ambulatory Visit

## 2024-08-13 ENCOUNTER — Ambulatory Visit

## 2024-09-13 ENCOUNTER — Ambulatory Visit

## 2024-10-14 ENCOUNTER — Ambulatory Visit

## 2024-11-14 ENCOUNTER — Ambulatory Visit
